# Patient Record
Sex: Male | Born: 1940
Health system: Southern US, Community
[De-identification: ages and names within clinical notes are randomized; demographics above are authoritative.]

## PROBLEM LIST (undated history)

## (undated) DIAGNOSIS — Z8719 Personal history of other diseases of the digestive system: Secondary | ICD-10-CM

## (undated) DIAGNOSIS — I10 Essential (primary) hypertension: Secondary | ICD-10-CM

## (undated) DIAGNOSIS — M1990 Unspecified osteoarthritis, unspecified site: Secondary | ICD-10-CM

## (undated) DIAGNOSIS — T8859XA Other complications of anesthesia, initial encounter: Secondary | ICD-10-CM

## (undated) DIAGNOSIS — T4145XA Adverse effect of unspecified anesthetic, initial encounter: Secondary | ICD-10-CM

## (undated) DIAGNOSIS — C801 Malignant (primary) neoplasm, unspecified: Secondary | ICD-10-CM

## (undated) DIAGNOSIS — Z9889 Other specified postprocedural states: Secondary | ICD-10-CM

## (undated) DIAGNOSIS — K219 Gastro-esophageal reflux disease without esophagitis: Secondary | ICD-10-CM

## (undated) DIAGNOSIS — R7303 Prediabetes: Secondary | ICD-10-CM

## (undated) DIAGNOSIS — R112 Nausea with vomiting, unspecified: Secondary | ICD-10-CM

## (undated) HISTORY — PX: OTHER SURGICAL HISTORY: SHX169

## (undated) HISTORY — PX: EYE SURGERY: SHX253

## (undated) HISTORY — PX: COLONOSCOPY: SHX174

---

## 1997-11-04 ENCOUNTER — Ambulatory Visit (HOSPITAL_BASED_OUTPATIENT_CLINIC_OR_DEPARTMENT_OTHER): Admission: RE | Admit: 1997-11-04 | Discharge: 1997-11-04 | Payer: Self-pay | Admitting: Orthopedic Surgery

## 2004-12-27 ENCOUNTER — Encounter: Admission: RE | Admit: 2004-12-27 | Discharge: 2004-12-27 | Payer: Self-pay | Admitting: Internal Medicine

## 2005-05-21 ENCOUNTER — Emergency Department (HOSPITAL_COMMUNITY): Admission: EM | Admit: 2005-05-21 | Discharge: 2005-05-21 | Payer: Self-pay | Admitting: Emergency Medicine

## 2005-07-29 DIAGNOSIS — D229 Melanocytic nevi, unspecified: Secondary | ICD-10-CM

## 2005-07-29 HISTORY — DX: Melanocytic nevi, unspecified: D22.9

## 2008-03-28 ENCOUNTER — Emergency Department (HOSPITAL_COMMUNITY): Admission: EM | Admit: 2008-03-28 | Discharge: 2008-03-28 | Payer: Self-pay | Admitting: Emergency Medicine

## 2008-09-03 ENCOUNTER — Emergency Department (HOSPITAL_COMMUNITY): Admission: EM | Admit: 2008-09-03 | Discharge: 2008-09-03 | Payer: Self-pay | Admitting: Emergency Medicine

## 2009-03-14 ENCOUNTER — Emergency Department (HOSPITAL_COMMUNITY): Admission: EM | Admit: 2009-03-14 | Discharge: 2009-03-14 | Payer: Self-pay | Admitting: Emergency Medicine

## 2009-06-04 ENCOUNTER — Emergency Department (HOSPITAL_COMMUNITY): Admission: EM | Admit: 2009-06-04 | Discharge: 2009-06-04 | Payer: Self-pay | Admitting: Emergency Medicine

## 2010-08-18 LAB — DIFFERENTIAL
Basophils Absolute: 0.1 10*3/uL (ref 0.0–0.1)
Basophils Relative: 1 % (ref 0–1)
Eosinophils Absolute: 0.2 10*3/uL (ref 0.0–0.7)
Neutro Abs: 6.8 10*3/uL (ref 1.7–7.7)

## 2010-08-18 LAB — CBC
HCT: 41.4 % (ref 39.0–52.0)
MCV: 80.8 fL (ref 78.0–100.0)
Platelets: 252 10*3/uL (ref 150–400)
RBC: 5.13 MIL/uL (ref 4.22–5.81)
WBC: 9.7 10*3/uL (ref 4.0–10.5)

## 2010-08-18 LAB — URINALYSIS, ROUTINE W REFLEX MICROSCOPIC
Glucose, UA: NEGATIVE mg/dL
Nitrite: NEGATIVE
Specific Gravity, Urine: 1.017 (ref 1.005–1.030)

## 2010-08-18 LAB — URINE MICROSCOPIC-ADD ON

## 2010-08-18 LAB — COMPREHENSIVE METABOLIC PANEL
AST: 19 U/L (ref 0–37)
Albumin: 3.8 g/dL (ref 3.5–5.2)
CO2: 27 mEq/L (ref 19–32)
Creatinine, Ser: 0.85 mg/dL (ref 0.4–1.5)
Glucose, Bld: 99 mg/dL (ref 70–99)
Potassium: 3.9 mEq/L (ref 3.5–5.1)
Total Bilirubin: 0.8 mg/dL (ref 0.3–1.2)
Total Protein: 6.5 g/dL (ref 6.0–8.3)

## 2011-02-08 LAB — POCT I-STAT, CHEM 8
BUN: 11
Chloride: 105
Glucose, Bld: 96
Hemoglobin: 14.6
Sodium: 141

## 2011-02-08 LAB — OCCULT BLOOD X 1 CARD TO LAB, STOOL: Fecal Occult Bld: NEGATIVE

## 2011-02-08 LAB — CBC
HCT: 42.6
RDW: 14.1

## 2011-02-08 LAB — URINALYSIS, ROUTINE W REFLEX MICROSCOPIC
Bilirubin Urine: NEGATIVE
Glucose, UA: NEGATIVE
Ketones, ur: NEGATIVE
Leukocytes, UA: NEGATIVE
Protein, ur: NEGATIVE
Specific Gravity, Urine: 1.005

## 2011-02-08 LAB — DIFFERENTIAL
Monocytes Absolute: 0.6
Monocytes Relative: 7
Neutro Abs: 6.3
Neutrophils Relative %: 70

## 2012-08-03 ENCOUNTER — Encounter (INDEPENDENT_AMBULATORY_CARE_PROVIDER_SITE_OTHER): Payer: Self-pay | Admitting: *Deleted

## 2012-08-15 ENCOUNTER — Encounter (INDEPENDENT_AMBULATORY_CARE_PROVIDER_SITE_OTHER): Payer: Self-pay | Admitting: *Deleted

## 2012-08-15 ENCOUNTER — Other Ambulatory Visit (INDEPENDENT_AMBULATORY_CARE_PROVIDER_SITE_OTHER): Payer: Self-pay | Admitting: *Deleted

## 2012-08-15 ENCOUNTER — Telehealth (INDEPENDENT_AMBULATORY_CARE_PROVIDER_SITE_OTHER): Payer: Self-pay | Admitting: *Deleted

## 2012-08-15 DIAGNOSIS — Z1211 Encounter for screening for malignant neoplasm of colon: Secondary | ICD-10-CM

## 2012-08-15 MED ORDER — PEG-KCL-NACL-NASULF-NA ASC-C 100 G PO SOLR: 1.0000 | Freq: Once | ORAL | Status: DC

## 2012-08-15 NOTE — Telephone Encounter (Signed)
Patient needs movi prep 

## 2012-10-03 ENCOUNTER — Ambulatory Visit (HOSPITAL_COMMUNITY): Admission: RE | Admit: 2012-10-03 | Payer: Medicare Other | Source: Ambulatory Visit | Admitting: Internal Medicine

## 2012-10-03 ENCOUNTER — Encounter (HOSPITAL_COMMUNITY): Admission: RE | Payer: Self-pay | Source: Ambulatory Visit

## 2012-10-03 SURGERY — COLONOSCOPY
Anesthesia: Moderate Sedation

## 2013-03-12 ENCOUNTER — Other Ambulatory Visit (HOSPITAL_COMMUNITY): Payer: Self-pay | Admitting: Internal Medicine

## 2013-03-12 DIAGNOSIS — R42 Dizziness and giddiness: Secondary | ICD-10-CM

## 2013-03-12 DIAGNOSIS — G459 Transient cerebral ischemic attack, unspecified: Secondary | ICD-10-CM

## 2013-03-14 ENCOUNTER — Ambulatory Visit (HOSPITAL_COMMUNITY)
Admission: RE | Admit: 2013-03-14 | Discharge: 2013-03-14 | Disposition: A | Discharge status: Home / Self Care | Payer: Medicare Other | Source: Ambulatory Visit | Attending: Internal Medicine | Admitting: Internal Medicine

## 2013-03-14 DIAGNOSIS — G459 Transient cerebral ischemic attack, unspecified: Secondary | ICD-10-CM

## 2013-03-14 DIAGNOSIS — I10 Essential (primary) hypertension: Secondary | ICD-10-CM | POA: Insufficient documentation

## 2013-03-14 DIAGNOSIS — R42 Dizziness and giddiness: Secondary | ICD-10-CM

## 2013-03-14 DIAGNOSIS — I079 Rheumatic tricuspid valve disease, unspecified: Secondary | ICD-10-CM | POA: Insufficient documentation

## 2013-03-14 DIAGNOSIS — I359 Nonrheumatic aortic valve disorder, unspecified: Secondary | ICD-10-CM | POA: Insufficient documentation

## 2013-03-14 DIAGNOSIS — I059 Rheumatic mitral valve disease, unspecified: Secondary | ICD-10-CM | POA: Insufficient documentation

## 2013-03-14 DIAGNOSIS — I517 Cardiomegaly: Secondary | ICD-10-CM | POA: Insufficient documentation

## 2013-03-14 NOTE — Progress Notes (Signed)
*  PRELIMINARY RESULTS* Vascular Ultrasound Carotid Duplex (Doppler) has been completed.  Preliminary findings: Bilateral:  1-39% ICA stenosis.  Vertebral artery flow is antegrade.      Farrel Demark, RDMS, RVT  03/14/2013, 9:48 AM

## 2013-03-14 NOTE — Progress Notes (Signed)
Echocardiogram 2D Echocardiogram has been performed.  Kristan Votta 03/14/2013, 9:50 AM

## 2014-04-09 DIAGNOSIS — C4492 Squamous cell carcinoma of skin, unspecified: Secondary | ICD-10-CM

## 2014-04-09 HISTORY — DX: Squamous cell carcinoma of skin, unspecified: C44.92

## 2014-05-13 DIAGNOSIS — R319 Hematuria, unspecified: Secondary | ICD-10-CM | POA: Diagnosis not present

## 2014-05-13 DIAGNOSIS — I10 Essential (primary) hypertension: Secondary | ICD-10-CM | POA: Diagnosis not present

## 2014-05-13 DIAGNOSIS — E119 Type 2 diabetes mellitus without complications: Secondary | ICD-10-CM | POA: Diagnosis not present

## 2014-07-28 DIAGNOSIS — I10 Essential (primary) hypertension: Secondary | ICD-10-CM | POA: Diagnosis not present

## 2014-07-28 DIAGNOSIS — E1169 Type 2 diabetes mellitus with other specified complication: Secondary | ICD-10-CM | POA: Diagnosis not present

## 2014-08-19 DIAGNOSIS — L719 Rosacea, unspecified: Secondary | ICD-10-CM | POA: Diagnosis not present

## 2014-10-14 DIAGNOSIS — L719 Rosacea, unspecified: Secondary | ICD-10-CM | POA: Diagnosis not present

## 2014-11-25 DIAGNOSIS — I10 Essential (primary) hypertension: Secondary | ICD-10-CM | POA: Diagnosis not present

## 2015-01-13 DIAGNOSIS — Z91018 Allergy to other foods: Secondary | ICD-10-CM | POA: Diagnosis not present

## 2015-01-13 DIAGNOSIS — H1045 Other chronic allergic conjunctivitis: Secondary | ICD-10-CM | POA: Diagnosis not present

## 2015-01-13 DIAGNOSIS — T783XXD Angioneurotic edema, subsequent encounter: Secondary | ICD-10-CM | POA: Diagnosis not present

## 2015-01-13 DIAGNOSIS — J3089 Other allergic rhinitis: Secondary | ICD-10-CM | POA: Diagnosis not present

## 2015-03-21 DIAGNOSIS — Z23 Encounter for immunization: Secondary | ICD-10-CM | POA: Diagnosis not present

## 2015-04-06 DIAGNOSIS — R319 Hematuria, unspecified: Secondary | ICD-10-CM | POA: Diagnosis not present

## 2015-04-06 DIAGNOSIS — I10 Essential (primary) hypertension: Secondary | ICD-10-CM | POA: Diagnosis not present

## 2015-04-27 ENCOUNTER — Inpatient Hospital Stay (HOSPITAL_COMMUNITY)
Admission: EM | Admit: 2015-04-27 | Discharge: 2015-04-30 | DRG: 392 | Disposition: A | Discharge status: Home / Self Care | Payer: Medicare Other | Attending: Internal Medicine | Admitting: Internal Medicine

## 2015-04-27 ENCOUNTER — Emergency Department (HOSPITAL_COMMUNITY): Payer: Medicare Other

## 2015-04-27 ENCOUNTER — Encounter (HOSPITAL_COMMUNITY): Payer: Self-pay | Admitting: Emergency Medicine

## 2015-04-27 DIAGNOSIS — F17220 Nicotine dependence, chewing tobacco, uncomplicated: Secondary | ICD-10-CM | POA: Diagnosis present

## 2015-04-27 DIAGNOSIS — K5792 Diverticulitis of intestine, part unspecified, without perforation or abscess without bleeding: Secondary | ICD-10-CM | POA: Diagnosis present

## 2015-04-27 DIAGNOSIS — R319 Hematuria, unspecified: Secondary | ICD-10-CM | POA: Diagnosis present

## 2015-04-27 DIAGNOSIS — R21 Rash and other nonspecific skin eruption: Secondary | ICD-10-CM | POA: Diagnosis present

## 2015-04-27 DIAGNOSIS — M1990 Unspecified osteoarthritis, unspecified site: Secondary | ICD-10-CM | POA: Diagnosis present

## 2015-04-27 DIAGNOSIS — D72829 Elevated white blood cell count, unspecified: Secondary | ICD-10-CM | POA: Diagnosis present

## 2015-04-27 DIAGNOSIS — R1030 Lower abdominal pain, unspecified: Secondary | ICD-10-CM | POA: Diagnosis present

## 2015-04-27 DIAGNOSIS — K5720 Diverticulitis of large intestine with perforation and abscess without bleeding: Secondary | ICD-10-CM | POA: Diagnosis not present

## 2015-04-27 DIAGNOSIS — Z880 Allergy status to penicillin: Secondary | ICD-10-CM

## 2015-04-27 DIAGNOSIS — R1032 Left lower quadrant pain: Secondary | ICD-10-CM | POA: Diagnosis not present

## 2015-04-27 DIAGNOSIS — I10 Essential (primary) hypertension: Secondary | ICD-10-CM | POA: Diagnosis not present

## 2015-04-27 HISTORY — DX: Essential (primary) hypertension: I10

## 2015-04-27 HISTORY — DX: Other complications of anesthesia, initial encounter: T88.59XA

## 2015-04-27 HISTORY — DX: Adverse effect of unspecified anesthetic, initial encounter: T41.45XA

## 2015-04-27 HISTORY — DX: Unspecified osteoarthritis, unspecified site: M19.90

## 2015-04-27 LAB — COMPREHENSIVE METABOLIC PANEL
ALK PHOS: 64 U/L (ref 38–126)
ALT: 14 U/L — AB (ref 17–63)
ANION GAP: 7 (ref 5–15)
AST: 17 U/L (ref 15–41)
Albumin: 4.1 g/dL (ref 3.5–5.0)
BILIRUBIN TOTAL: 1.2 mg/dL (ref 0.3–1.2)
BUN: 13 mg/dL (ref 6–20)
CALCIUM: 9.5 mg/dL (ref 8.9–10.3)
CO2: 26 mmol/L (ref 22–32)
Chloride: 103 mmol/L (ref 101–111)
Creatinine, Ser: 0.98 mg/dL (ref 0.61–1.24)
GFR calc Af Amer: >60 mL/min (ref >60)
GFR calc non Af Amer: >60 mL/min (ref >60)
GLUCOSE: 119 mg/dL — AB (ref 65–99)
Potassium: 4.3 mmol/L (ref 3.5–5.1)
Sodium: 136 mmol/L (ref 135–145)
Total Protein: 7.4 g/dL (ref 6.5–8.1)

## 2015-04-27 LAB — PROTIME-INR
INR: 1.16 (ref 0.00–1.49)
Prothrombin Time: 15 seconds (ref 11.6–15.2)

## 2015-04-27 LAB — CBC
HCT: 40.2 % (ref 39.0–52.0)
HEMATOCRIT: 45.3 % (ref 39.0–52.0)
Hemoglobin: 15.1 g/dL (ref 13.0–17.0)
Hemoglobin: 13.8 g/dL (ref 13.0–17.0)
MCH: 27.1 pg (ref 26.0–34.0)
MCH: 27.8 pg (ref 26.0–34.0)
MCHC: 33.3 g/dL (ref 30.0–36.0)
MCHC: 34.3 g/dL (ref 30.0–36.0)
MCV: 81.2 fL (ref 78.0–100.0)
MCV: 80.9 fL (ref 78.0–100.0)
PLATELETS: 272 10*3/uL (ref 150–400)
PLATELETS: 221 10*3/uL (ref 150–400)
RBC: 5.58 MIL/uL (ref 4.22–5.81)
RBC: 4.97 MIL/uL (ref 4.22–5.81)
RDW: 14.1 % (ref 11.5–15.5)
RDW: 14.2 % (ref 11.5–15.5)
WBC: 14 10*3/uL — AB (ref 4.0–10.5)
WBC: 10.3 10*3/uL (ref 4.0–10.5)

## 2015-04-27 LAB — URINALYSIS, ROUTINE W REFLEX MICROSCOPIC
BILIRUBIN URINE: NEGATIVE
GLUCOSE, UA: NEGATIVE mg/dL
KETONES UR: NEGATIVE mg/dL
LEUKOCYTES UA: NEGATIVE
Nitrite: NEGATIVE
PH: 6.5 (ref 5.0–8.0)
PROTEIN: NEGATIVE mg/dL
Specific Gravity, Urine: 1.005 (ref 1.005–1.030)

## 2015-04-27 LAB — CREATININE, SERUM
CREATININE: 0.94 mg/dL (ref 0.61–1.24)
GFR calc non Af Amer: >60 mL/min (ref >60)

## 2015-04-27 LAB — URINE MICROSCOPIC-ADD ON
Bacteria, UA: NONE SEEN
Squamous Epithelial / LPF: NONE SEEN

## 2015-04-27 LAB — LIPASE, BLOOD: Lipase: 24 U/L (ref 11–51)

## 2015-04-27 MED ORDER — IOHEXOL 300 MG/ML  SOLN
50.0000 mL | Freq: Once | INTRAMUSCULAR | Status: DC | PRN
  Administered 2015-04-27: 50 mL via ORAL
  Filled 2015-04-27: qty 50

## 2015-04-27 MED ORDER — ONDANSETRON HCL 4 MG PO TABS: 4.0000 mg | ORAL_TABLET | Freq: Four times a day (QID) | ORAL | Status: DC | PRN

## 2015-04-27 MED ORDER — IOHEXOL 300 MG/ML  SOLN
100.0000 mL | Freq: Once | INTRAMUSCULAR | Status: AC | PRN
  Administered 2015-04-27: 100 mL via INTRAVENOUS

## 2015-04-27 MED ORDER — GUAIFENESIN-DM 100-10 MG/5ML PO SYRP: 5.0000 mL | ORAL_SOLUTION | ORAL | Status: DC | PRN

## 2015-04-27 MED ORDER — SODIUM CHLORIDE 0.9 % IV BOLUS (SEPSIS)
500.0000 mL | Freq: Once | INTRAVENOUS | Status: AC
  Administered 2015-04-27: 500 mL via INTRAVENOUS

## 2015-04-27 MED ORDER — MORPHINE SULFATE (PF) 2 MG/ML IV SOLN
2.0000 mg | Freq: Once | INTRAVENOUS | Status: AC
  Administered 2015-04-27: 2 mg via INTRAVENOUS
  Filled 2015-04-27: qty 1

## 2015-04-27 MED ORDER — HYDROCODONE-ACETAMINOPHEN 5-325 MG PO TABS: 1.0000 | ORAL_TABLET | ORAL | Status: DC | PRN

## 2015-04-27 MED ORDER — MORPHINE SULFATE (PF) 2 MG/ML IV SOLN: 2.0000 mg | INTRAVENOUS | Status: DC | PRN

## 2015-04-27 MED ORDER — ONDANSETRON HCL 4 MG/2ML IJ SOLN: 4.0000 mg | Freq: Four times a day (QID) | INTRAMUSCULAR | Status: DC | PRN

## 2015-04-27 MED ORDER — DOCUSATE SODIUM 100 MG PO CAPS
200.0000 mg | ORAL_CAPSULE | Freq: Two times a day (BID) | ORAL | Status: DC
  Administered 2015-04-27 – 2015-04-29 (×5): 200 mg via ORAL
  Filled 2015-04-27 (×7): qty 2

## 2015-04-27 MED ORDER — ENOXAPARIN SODIUM 40 MG/0.4ML ~~LOC~~ SOLN
40.0000 mg | SUBCUTANEOUS | Status: DC
  Administered 2015-04-27 – 2015-04-29 (×3): 40 mg via SUBCUTANEOUS
  Filled 2015-04-27 (×4): qty 0.4

## 2015-04-27 MED ORDER — DILTIAZEM HCL ER COATED BEADS 120 MG PO CP24
120.0000 mg | ORAL_CAPSULE | Freq: Every day | ORAL | Status: DC
  Administered 2015-04-27 – 2015-04-29 (×3): 120 mg via ORAL
  Filled 2015-04-27 (×4): qty 1

## 2015-04-27 MED ORDER — HYDRALAZINE HCL 20 MG/ML IJ SOLN: 10.0000 mg | Freq: Four times a day (QID) | INTRAMUSCULAR | Status: DC | PRN

## 2015-04-27 MED ORDER — SODIUM CHLORIDE 0.9 % IV SOLN
500.0000 mg | Freq: Three times a day (TID) | INTRAVENOUS | Status: DC
  Administered 2015-04-27 – 2015-04-28 (×3): 500 mg via INTRAVENOUS
  Filled 2015-04-27 (×4): qty 500

## 2015-04-27 MED ORDER — SODIUM CHLORIDE 0.9 % IV SOLN
INTRAVENOUS | Status: DC
  Administered 2015-04-27: 1 mL via INTRAVENOUS

## 2015-04-27 NOTE — ED Notes (Signed)
Bed: WA08 Expected date:  Expected time:  Means of arrival:  Comments: Hold for triage 2 

## 2015-04-27 NOTE — ED Notes (Signed)
Report called to [facility] 5th floor.

## 2015-04-27 NOTE — ED Notes (Signed)
Per pt, states he is having abdominal pain r/t his diverticulitis-states he has been taking an antibiotic

## 2015-04-27 NOTE — Progress Notes (Signed)
ANTIBIOTIC CONSULT NOTE - INITIAL  Pharmacy Consult for Primaxin Indication: Intra-abdominal infection  Allergies  Allergen Reactions  . Ciprofloxacin Other (See Comments)    "[place] me in [place]" messed with is mind. Was taking with the flagyl so not sure which medication he is allergic to.   . Flagyl [Metronidazole] Other (See Comments)    "put me in [place]" messed with his mind. Was taking in combination with Cipro. So pt is unsure which medication he is allergic to.   . Penicillins     Has patient had a PCN reaction causing immediate rash, facial/tongue/throat swelling, SOB or lightheadedness with hypotension: No Has patient had a PCN reaction causing severe rash involving mucus membranes or skin necrosis: Yes-rash on chest. Has patient had a PCN reaction that required hospitalization No Has patient had a PCN reaction occurring within the last 10 years: No If all of the above answers are "NO", then may proceed with Cephalosporin use.     Patient Measurements:   Adjusted Body Weight:   Vital Signs: Temp: 97.8 F (36.6 C) (12/19 1541) Temp Source: Temporal (12/19 1541) BP: 142/86 mmHg (12/19 1541) Pulse Rate: 75 (12/19 1541) Intake/Output from previous day:   Intake/Output from this shift:    Labs:  Recent Labs  04/27/15 1309  WBC 14.0*  HGB 15.1  PLT 272  CREATININE 0.98   CrCl cannot be calculated (Unknown ideal weight.). No results for input(s): VANCOTROUGH, VANCOPEAK, VANCORANDOM, GENTTROUGH, GENTPEAK, GENTRANDOM, TOBRATROUGH, TOBRAPEAK, TOBRARND, AMIKACINPEAK, AMIKACINTROU, AMIKACIN in the last 72 hours.   Microbiology: No results found for this or any previous visit (from the past 720 hour(s)).  Medical History: Past Medical History  Diagnosis Date  . Diverticulitis   . Essential hypertension     Medications:  Anti-infectives    Start     Dose/Rate Route Frequency Ordered Stop   04/27/15 1600  imipenem-cilastatin (PRIMAXIN) 500 mg in sodium  chloride 0.9 % 100 mL IVPB     500 mg 200 mL/hr over 30 Minutes Intravenous 3 times per day 04/27/15 1524       Assessment: 74yo M with history of diverticulitis and two day worsening abdominal pain despite outpatient doxycycline. Pharmacy is asked to dose Primaxin. SCr is wnl, CrCl ~67N.  Goal of Therapy:  Appropriate antibiotic dosing for renal function; eradication of infection  Plan:  Primaxin 500mg  IV q8h. Follow up renal fxn, culture results, and clinical course.  Romeo Rabon, PharmD, pager [phone]. 04/27/2015,6:01 PM.

## 2015-04-27 NOTE — H&P (Signed)
Patient Demographics:    John Austin, is a 74 y.o. male  MRN: [id number]   DOB - January 13, 1941  Admit Date - 04/27/2015  Outpatient Primary MD for the patient is Cleda Mccreedy, MD   With History of -  Past Medical History  Diagnosis Date  . Diverticulitis   . Essential hypertension       History reviewed. No pertinent past surgical history.  in for   Chief Complaint  Patient presents with  . Abdominal Pain      HPI:    John Austin  is a 74 y.o. 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 Came to the ER where workup was suggestive of descending diverticulitis, I was called to admit the patient.  Besides above dictated review of systems all other review of systems are negative.    Review of systems:    In addition to the HPI above,   Positive subjective Fever-chills, No Headache, No changes with Vision or hearing, No problems swallowing food or Liquids, No Chest pain, Cough or Shortness of Breath, As above Abdominal pain, No Nausea or Vommitting, Bowel movements are regular, No Blood in stool or Urine, No dysuria, No new skin rashes or bruises, No new joints pains-aches,  No new weakness, tingling, numbness in any extremity, No recent weight gain or loss, No polyuria, polydypsia or polyphagia, No significant Mental Stressors.  A full 10 point Review of Systems was  done, except as stated above, all other Review of Systems were negative.    Social History:     Social History  Substance Use Topics  . Smoking status: Never Smoker   . Smokeless tobacco: Current User    Types: Chew  . Alcohol Use: No    Lives - at home and is fairly mobile and active      Family History :   History of colon polyps and his son   Home Medications:   Prior to Admission medications   Medication Sig Start Date End Date Taking? Authorizing Provider  celecoxib (CELEBREX) 200 MG capsule Take 1 capsule by mouth daily as needed. Foot pain. 03/31/15  Yes Historical Provider, MD  diltiazem (CARDIZEM CD) 120 MG 24 hr capsule Take 1 capsule by mouth at bedtime. 04/25/15  Yes Historical Provider, MD  doxycycline (VIBRAMYCIN) 50 MG capsule Take 1 capsule by mouth at bedtime. 03/02/15  Yes Historical Provider, MD  fexofenadine (ALLEGRA) 180 MG tablet Take 180 mg by mouth daily.   Yes Historical Provider, MD  fluticasone (FLONASE) 50 MCG/ACT nasal spray Place 1  spray into both nostrils daily as needed. Allergies. 03/30/15  Yes Historical Provider, MD  meclizine (ANTIVERT) 25 MG tablet Take 1 tablet by mouth daily as needed. dizziness 02/02/15  Yes Historical Provider, MD  peg 3350 powder (MOVIPREP) 100 G SOLR Take 1 kit (100 g total) by mouth once. Patient not taking: Reported on 04/27/2015 08/15/12   Butch Penny, NP     Allergies:     Allergies  Allergen Reactions  . Ciprofloxacin Other (See Comments)    "put me in [place]" messed with is mind. Was taking with the flagyl so not sure which medication he is allergic to.   . Flagyl [Metronidazole] Other (See Comments)    "put me in [place]" messed with his mind. Was taking in combination with Cipro. So pt is unsure which medication he is allergic to.   . Penicillins     Has patient had a PCN reaction causing immediate rash, facial/tongue/throat swelling, SOB or lightheadedness with hypotension: No Has patient had a  PCN reaction causing severe rash involving mucus membranes or skin necrosis: Yes-rash on chest. Has patient had a PCN reaction that required hospitalization No Has patient had a PCN reaction occurring within the last 10 years: No If all of the above answers are "NO", then may proceed with Cephalosporin use.      Physical Exam:   Vitals  Blood pressure 142/86, pulse 75, temperature 97.8 F (36.6 C), temperature source Temporal, resp. rate 18, SpO2 98 %.   1. General pleasant elderly white male lying in bed in NAD,     2. Normal affect and insight, Not Suicidal or Homicidal, Awake Alert, Oriented X 3.  3. No F.N deficits, ALL C.Nerves Intact, Strength 5/5 all 4 extremities, Sensation intact all 4 extremities, Plantars down going.  4. Ears and Eyes appear Normal, Conjunctivae clear, PERRLA. Moist Oral Mucosa.  5. Supple Neck, No JVD, No cervical lymphadenopathy appriciated, No Carotid Bruits.  6. Symmetrical Chest wall movement, Good air movement bilaterally, CTAB.  7. RRR, No Gallops, Rubs or Murmurs, No Parasternal Heave.  8. Positive Bowel Sounds, Abdomen Soft, mild LLQ tenderness, No organomegaly appriciated,No rebound -guarding or rigidity.  9.  No Cyanosis, Normal Skin Turgor, No Skin Rash or Bruise.  10. Good muscle tone,  joints appear normal , no effusions, Normal ROM.  11. No Palpable Lymph Nodes in Neck or Axillae      Data Review:    CBC  Recent Labs Lab 04/27/15 1309  WBC 14.0*  HGB 15.1  HCT 45.3  PLT 272  MCV 81.2  MCH 27.1  MCHC 33.3  RDW 14.1   ------------------------------------------------------------------------------------------------------------------  Chemistries   Recent Labs Lab 04/27/15 1309  NA 136  K 4.3  CL 103  CO2 26  GLUCOSE 119*  BUN 13  CREATININE 0.98  CALCIUM 9.5  AST 17  ALT 14*  ALKPHOS 64  BILITOT 1.2    ------------------------------------------------------------------------------------------------------------------ CrCl cannot be calculated (Unknown ideal weight.). ------------------------------------------------------------------------------------------------------------------ No results for input(s): TSH, T4TOTAL, T3FREE, THYROIDAB in the last 72 hours.  Invalid input(s): FREET3   Coagulation profile No results for input(s): INR, PROTIME in the last 168 hours. ------------------------------------------------------------------------------------------------------------------- No results for input(s): DDIMER in the last 72 hours. -------------------------------------------------------------------------------------------------------------------  Cardiac Enzymes No results for input(s): CKMB, TROPONINI, MYOGLOBIN in the last 168 hours.  Invalid input(s): CK ------------------------------------------------------------------------------------------------------------------ Invalid input(s): POCBNP   ---------------------------------------------------------------------------------------------------------------  Urinalysis    Component Value Date/Time   COLORURINE YELLOW 04/27/2015 [place]  04/27/2015 1438   LABSPEC 1.005 04/27/2015 1438   PHURINE 6.5 04/27/2015 1438   GLUCOSEU NEGATIVE 04/27/2015 1438   HGBUR MODERATE* 04/27/2015 [place] 04/27/2015 [place] 04/27/2015 1438   PROTEINUR NEGATIVE 04/27/2015 1438   UROBILINOGEN 0.2 09/03/2008 1606   NITRITE NEGATIVE 04/27/2015 1438   LEUKOCYTESUR NEGATIVE 04/27/2015 1438    ----------------------------------------------------------------------------------------------------------------   Imaging Results:    Ct Abdomen Pelvis W Contrast  04/27/2015  CLINICAL DATA:  Left lower quadrant pain for 1 day. History of diverticulitis. EXAM: CT ABDOMEN AND PELVIS WITH CONTRAST  TECHNIQUE: Multidetector CT imaging of the abdomen and pelvis was performed using the standard protocol following bolus administration of intravenous contrast. CONTRAST:  150m OMNIPAQUE IOHEXOL 300 MG/ML  SOLN COMPARISON:  09/03/2008 FINDINGS: 3 mm right middle lobe nodule is unchanged and considered benign. The lung bases are free of consolidation or effusion. Small layering stones are again seen in the gallbladder. There is no biliary dilatation. The liver, spleen, adrenal glands, and pancreas have an unremarkable enhanced appearance. A 3.8 cm left lower pole renal cyst is larger than on the prior study (previously 2.9 cm). Additional subcentimeter hypodensities are noted both kidneys, likely cysts but too small to fully characterize. There is a moderate-size sliding hiatal hernia. Oral contrast is present in nondilated loops of small and large bowel to the level of the ascending colon without evidence of obstruction. Diverticulosis is again seen of the descending and sigmoid colon. There is moderate wall thickening and surrounding inflammatory changes involving the distal descending colon. There is no evidence of frank bowel perforation or abscess. The appendix is unremarkable. The bladder is unremarkable. A retroaortic left renal vein is incidentally noted. Moderate atherosclerotic vascular calcification is present. No free fluid or enlarged lymph nodes are identified. A small amount of fat enters both inguinal canals. Moderate lumbar disc degeneration is noted. IMPRESSION: 1. Acute diverticulitis of the distal descending colon.  No abscess. 2. Cholelithiasis. 3. Hiatal hernia. Electronically Signed   By: ALogan BoresM.D.   On: 04/27/2015 15:34        Assessment & Plan:     1.  Acute diverticulitis - admit to MedSurg, bowel rest, IV fluids, due to multiple drug allergies place on IV Primaxin to be dosed by pharmacy. Place on Colace avoid constipation. Continue supportive care.   2. Essential  hypertension. As needed IV hydralazine and monitor, hold Cardizem.  3. Tobacco chewing. Counseled to quit.  4. Chronic stable hematuria. No acute issues outpatient follow-up with PCP.   5. Osteoarthritis diffuse. Supportive care.   DVT Prophylaxis   Lovenox   AM Labs Ordered, also please review Full Orders  Family Communication: Admission, patients condition and plan of care including tests being ordered have been discussed with the patient and family who indicate understanding and agree with the plan and Code Status.  Code Status Full  Likely DC to Home 2-3 days  Condition Fair  Time spent in minutes : 35    SINGH,PRASHANT K M.D on 04/27/2015 at 4:13 PM  Between 7am to 7pm - Pager - [phone] After 7pm go to [email or web address] - password [email or web address] - Office  [phone]

## 2015-04-27 NOTE — ED Provider Notes (Addendum)
CSN: [id number]     Arrival date & time 04/27/15  1248 History   First MD Initiated Contact with Patient 04/27/15 1315     Chief Complaint  Patient presents with  . Abdominal Pain     (Consider location/radiation/quality/duration/timing/severity/associated sxs/prior Treatment) HPI  74 year old man history of diverticulitis presents today complaining of lower abdominal pain is consistent with his diverticulitis. He states that it started 2 days ago. He began taking doxycycline which his primary care physician has given him to take for this. The pain has continued to increase. He is able to take in liquids but does not have any appetite. He denies any fever or chills. He is nauseated but has not been vomiting. He has not had diarrhea. Pain is moderate to severe.  Past Medical History  Diagnosis Date  . Diverticulitis   . Hypertension    History reviewed. No pertinent past surgical history. No family history on file. Social History  Substance Use Topics  . Smoking status: Never Smoker   . Smokeless tobacco: None  . Alcohol Use: No    Review of Systems  All other systems reviewed and are negative.     Allergies  Ciprofloxacin; Flagyl; and Penicillins  Home Medications   Prior to Admission medications   Medication Sig Start Date End Date Taking? Authorizing Provider  celecoxib (CELEBREX) 200 MG capsule Take 1 capsule by mouth daily as needed. Foot pain. 03/31/15  Yes Historical Provider, MD  diltiazem (CARDIZEM CD) 120 MG 24 hr capsule Take 1 capsule by mouth at bedtime. 04/25/15  Yes Historical Provider, MD  doxycycline (VIBRAMYCIN) 50 MG capsule Take 1 capsule by mouth at bedtime. 03/02/15  Yes Historical Provider, MD  fexofenadine (ALLEGRA) 180 MG tablet Take 180 mg by mouth daily.   Yes Historical Provider, MD  fluticasone (FLONASE) 50 MCG/ACT nasal spray Place 1 spray into both nostrils daily as needed. Allergies. 03/30/15  Yes Historical Provider, MD  meclizine  (ANTIVERT) 25 MG tablet Take 1 tablet by mouth daily as needed. dizziness 02/02/15  Yes Historical Provider, MD  peg 3350 powder (MOVIPREP) 100 G SOLR Take 1 kit (100 g total) by mouth once. Patient not taking: Reported on 04/27/2015 08/15/12   Butch Penny, NP   BP 128/79 mmHg  Pulse 88  Temp(Src) 97.8 F (36.6 C) (Oral)  Resp 18  SpO2 100% Physical Exam  Constitutional: He appears well-developed and well-nourished. No distress.  HENT:  Head: Normocephalic and atraumatic.  Right Ear: External ear normal.  Left Ear: External ear normal.  Nose: Nose normal.  Mouth/Throat: Oropharynx is clear and moist.  Eyes: Conjunctivae and EOM are normal. Pupils are equal, round, and reactive to light.  Neck: Normal range of motion. Neck supple.  Cardiovascular: Normal rate and regular rhythm.   Pulmonary/Chest: Effort normal and breath sounds normal.  Abdominal: Soft. There is tenderness.    Musculoskeletal: Normal range of motion.  Neurological: He is alert.  Skin: Skin is warm and dry.  Psychiatric: He has a normal mood and affect. His behavior is normal. Thought content normal.  Nursing note and vitals reviewed.   ED Course  Procedures (including critical care time) Labs Review Labs Reviewed  COMPREHENSIVE METABOLIC PANEL - Abnormal; Notable for the following:    Glucose, Bld 119 (*)    ALT 14 (*)    All other components within normal limits  CBC - Abnormal; Notable for the following:    WBC 14.0 (*)    All other components within  normal limits  LIPASE, BLOOD  URINALYSIS, ROUTINE W REFLEX MICROSCOPIC (NOT AT [facility])    Imaging Review Ct Abdomen Pelvis W Contrast  04/27/2015  CLINICAL DATA:  Left lower quadrant pain for 1 day. History of diverticulitis. EXAM: CT ABDOMEN AND PELVIS WITH CONTRAST TECHNIQUE: Multidetector CT imaging of the abdomen and pelvis was performed using the standard protocol following bolus administration of intravenous contrast. CONTRAST:  134m OMNIPAQUE  IOHEXOL 300 MG/ML  SOLN COMPARISON:  09/03/2008 FINDINGS: 3 mm right middle lobe nodule is unchanged and considered benign. The lung bases are free of consolidation or effusion. Small layering stones are again seen in the gallbladder. There is no biliary dilatation. The liver, spleen, adrenal glands, and pancreas have an unremarkable enhanced appearance. A 3.8 cm left lower pole renal cyst is larger than on the prior study (previously 2.9 cm). Additional subcentimeter hypodensities are noted both kidneys, likely cysts but too small to fully characterize. There is a moderate-size sliding hiatal hernia. Oral contrast is present in nondilated loops of small and large bowel to the level of the ascending colon without evidence of obstruction. Diverticulosis is again seen of the descending and sigmoid colon. There is moderate wall thickening and surrounding inflammatory changes involving the distal descending colon. There is no evidence of frank bowel perforation or abscess. The appendix is unremarkable. The bladder is unremarkable. A retroaortic left renal vein is incidentally noted. Moderate atherosclerotic vascular calcification is present. No free fluid or enlarged lymph nodes are identified. A small amount of fat enters both inguinal canals. Moderate lumbar disc degeneration is noted. IMPRESSION: 1. Acute diverticulitis of the distal descending colon.  No abscess. 2. Cholelithiasis. 3. Hiatal hernia. Electronically Signed   By: ALogan BoresM.D.   On: 04/27/2015 15:34   I have personally reviewed and evaluated these images and lab results as part of my medical decision-making.   EKG Interpretation None      MDM   Final diagnoses:  Diverticulitis of large intestine with perforation and abscess without bleeding    74year old and history of diverticulitis presents today with 2 days of worsening diverticulitis symptoms. Workup is significant for leukocytosis and acute diverticulitis of the distal  descending colon seen on CT. No evidence of abscess is noted. Patient is given IV antibiotics. He had been taking outpatient oral antibiotics prior to coming in. Plan admission due to failed outpatient therapy of diverticulitis.    DPattricia Boss MD 04/29/15 1415  DPattricia Boss MD 04/29/15 [phone]

## 2015-04-28 LAB — BASIC METABOLIC PANEL
Anion gap: 8 (ref 5–15)
BUN: 12 mg/dL (ref 6–20)
CALCIUM: 8.8 mg/dL — AB (ref 8.9–10.3)
CHLORIDE: 107 mmol/L (ref 101–111)
CO2: 25 mmol/L (ref 22–32)
CREATININE: 0.9 mg/dL (ref 0.61–1.24)
GFR calc Af Amer: >60 mL/min (ref >60)
GFR calc non Af Amer: >60 mL/min (ref >60)
GLUCOSE: 76 mg/dL (ref 65–99)
Potassium: 4.3 mmol/L (ref 3.5–5.1)
Sodium: 140 mmol/L (ref 135–145)

## 2015-04-28 LAB — CBC
HCT: 38.3 % — ABNORMAL LOW (ref 39.0–52.0)
Hemoglobin: 12.9 g/dL — ABNORMAL LOW (ref 13.0–17.0)
MCH: 27.5 pg (ref 26.0–34.0)
MCHC: 33.7 g/dL (ref 30.0–36.0)
MCV: 81.7 fL (ref 78.0–100.0)
PLATELETS: 208 10*3/uL (ref 150–400)
RBC: 4.69 MIL/uL (ref 4.22–5.81)
RDW: 14.4 % (ref 11.5–15.5)
WBC: 8.1 10*3/uL (ref 4.0–10.5)

## 2015-04-28 MED ORDER — SODIUM CHLORIDE 0.9 % IV SOLN: INTRAVENOUS | Status: AC

## 2015-04-28 MED ORDER — SODIUM CHLORIDE 0.9 % IV SOLN
500.0000 mg | Freq: Four times a day (QID) | INTRAVENOUS | Status: DC
  Administered 2015-04-28 – 2015-04-30 (×8): 500 mg via INTRAVENOUS
  Filled 2015-04-28 (×9): qty 500

## 2015-04-28 NOTE — Progress Notes (Signed)
Patient Demographics:    John Austin, is a 74 y.o. male, DOB - [birth year], BP:[id number]  Admit date - 04/27/2015   Admitting Physician Thurnell Lose, MD  Outpatient Primary MD for the patient is Cleda Mccreedy, MD  LOS - 1   Chief Complaint  Patient presents with  . Abdominal Pain        Subjective:    John Austin today has, No headache, No chest pain, improved left lower quadrant abdominal pain - No Nausea, No new weakness tingling or numbness, No Cough - SOB.     Assessment  & Plan :    1. Acute diverticulitis - says he is feels better about 30-40%, we'll advance to clear liquids, continue gentle IV fluids, due to multiple drug allergies place on IV Primaxin to be dosed by pharmacy. Place on Colace avoid constipation. Continue supportive care. If better discharge in 1-2 days.  2. Essential hypertension. As needed IV hydralazine and monitor, hold Cardizem.  3. Tobacco chewing. Counseled to quit.  4. Chronic stable hematuria. No acute issues outpatient follow-up with PCP.   5. Osteoarthritis diffuse. Supportive care.    Code Status : Full  Family Communication  : wife bedside  Disposition Plan  : Home 1-2 days  Consults  :  None  Procedures  : CT scan abdomen and pelvis confirming descending colon diverticulitis  DVT Prophylaxis  :  Lovenox    Lab Results  Component Value Date   PLT 208 04/28/2015    Inpatient Medications  Scheduled Meds: . diltiazem  120 mg Oral QHS  . docusate sodium  200 mg Oral BID  . enoxaparin (LOVENOX) injection  40 mg Subcutaneous Q24H  . imipenem-cilastatin  500 mg Intravenous Q6H   Continuous Infusions: . sodium chloride 1 mL (04/27/15 1609)   PRN Meds:.guaiFENesin-dextromethorphan, hydrALAZINE, HYDROcodone-acetaminophen, iohexol,  morphine injection, ondansetron **OR** ondansetron (ZOFRAN) IV  Antibiotics  :     Anti-infectives    Start     Dose/Rate Route Frequency Ordered Stop   04/28/15 1230  imipenem-cilastatin (PRIMAXIN) 500 mg in sodium chloride 0.9 % 100 mL IVPB     500 mg 200 mL/hr over 30 Minutes Intravenous Every 6 hours 04/28/15 0821     04/27/15 1600  imipenem-cilastatin (PRIMAXIN) 500 mg in sodium chloride 0.9 % 100 mL IVPB  Status:  Discontinued     500 mg 200 mL/hr over 30 Minutes Intravenous 3 times per day 04/27/15 1524 04/28/15 0821        Objective:   Filed Vitals:   04/27/15 1541 04/27/15 1707 04/27/15 2220 04/28/15 0455  BP: 142/86 140/75 104/67 124/63  Pulse: 75 66 78 61  Temp: 97.8 F (36.6 C) 98 F (36.7 C) 97.6 F (36.4 C) 97.6 F (36.4 C)  TempSrc: Temporal Oral Oral Oral  Resp: 18 18 16 18   Height:  5\' 9"  (1.753 m)    Weight:  86.1 kg (189 lb 13.1 oz)    SpO2: 98% 98% 96% 96%    Wt Readings from Last 3 Encounters:  04/27/15 86.1 kg (189 lb 13.1 oz)     Intake/Output Summary (Last 24 hours) at 04/28/15 0910 Last data filed at 04/28/15 [id number]  Gross per 24 hour  Intake   1385  ml  Output      0 ml  Net   1385 ml     Physical Exam  Awake Alert, Oriented X 3, No new F.N deficits, Normal affect John Austin.AT,PERRAL Supple Neck,No JVD, No cervical lymphadenopathy appriciated.  Symmetrical Chest wall movement, Good air movement bilaterally, CTAB RRR,No Gallops,Rubs or new Murmurs, No Parasternal Heave +ve B.Sounds, Abd Soft, mild lower quadrant tenderness, left more than right, No organomegaly appriciated, No rebound - guarding or rigidity. No Cyanosis, Clubbing or edema, No new Rash or bruise       Data Review:   Micro Results No results found for this or any previous visit (from the past 240 hour(s)).  Radiology Reports Ct Abdomen Pelvis W Contrast  04/27/2015  CLINICAL DATA:  Left lower quadrant pain for 1 day. History of diverticulitis. EXAM: CT ABDOMEN AND PELVIS  WITH CONTRAST TECHNIQUE: Multidetector CT imaging of the abdomen and pelvis was performed using the standard protocol following bolus administration of intravenous contrast. CONTRAST:  136mL OMNIPAQUE IOHEXOL 300 MG/ML  SOLN COMPARISON:  09/03/2008 FINDINGS: 3 mm right middle lobe nodule is unchanged and considered benign. The lung bases are free of consolidation or effusion. Small layering stones are again seen in the gallbladder. There is no biliary dilatation. The liver, spleen, adrenal glands, and pancreas have an unremarkable enhanced appearance. A 3.8 cm left lower pole renal cyst is larger than on the prior study (previously 2.9 cm). Additional subcentimeter hypodensities are noted both kidneys, likely cysts but too small to fully characterize. There is a moderate-size sliding hiatal hernia. Oral contrast is present in nondilated loops of small and large bowel to the level of the ascending colon without evidence of obstruction. Diverticulosis is again seen of the descending and sigmoid colon. There is moderate wall thickening and surrounding inflammatory changes involving the distal descending colon. There is no evidence of frank bowel perforation or abscess. The appendix is unremarkable. The bladder is unremarkable. A retroaortic left renal vein is incidentally noted. Moderate atherosclerotic vascular calcification is present. No free fluid or enlarged lymph nodes are identified. A small amount of fat enters both inguinal canals. Moderate lumbar disc degeneration is noted. IMPRESSION: 1. Acute diverticulitis of the distal descending colon.  No abscess. 2. Cholelithiasis. 3. Hiatal hernia. Electronically Signed   By: Logan Bores M.D.   On: 04/27/2015 15:34     CBC  Recent Labs Lab 04/27/15 1309 04/27/15 1745 04/28/15 0450  WBC 14.0* 10.3 8.1  HGB 15.1 13.8 12.9*  HCT 45.3 40.2 38.3*  PLT 272 221 208  MCV 81.2 80.9 81.7  MCH 27.1 27.8 27.5  MCHC 33.3 34.3 33.7  RDW 14.1 14.2 14.4     Chemistries   Recent Labs Lab 04/27/15 1309 04/27/15 1745 04/28/15 0450  NA 136  --  140  K 4.3  --  4.3  CL 103  --  107  CO2 26  --  25  GLUCOSE 119*  --  76  BUN 13  --  12  CREATININE 0.98 0.94 0.90  CALCIUM 9.5  --  8.8*  AST 17  --   --   ALT 14*  --   --   ALKPHOS 64  --   --   BILITOT 1.2  --   --    ------------------------------------------------------------------------------------------------------------------ estimated creatinine clearance is 78.3 mL/min (by C-G formula based on Cr of 0.9). ------------------------------------------------------------------------------------------------------------------ No results for input(s): HGBA1C in the last 72 hours. ------------------------------------------------------------------------------------------------------------------ No results for input(s): CHOL, HDL, LDLCALC,  TRIG, CHOLHDL, LDLDIRECT in the last 72 hours. ------------------------------------------------------------------------------------------------------------------ No results for input(s): TSH, T4TOTAL, T3FREE, THYROIDAB in the last 72 hours.  Invalid input(s): FREET3 ------------------------------------------------------------------------------------------------------------------ No results for input(s): VITAMINB12, FOLATE, FERRITIN, TIBC, IRON, RETICCTPCT in the last 72 hours.  Coagulation profile  Recent Labs Lab 04/27/15 1745  INR 1.16    No results for input(s): DDIMER in the last 72 hours.  Cardiac Enzymes No results for input(s): CKMB, TROPONINI, MYOGLOBIN in the last 168 hours.  Invalid input(s): CK ------------------------------------------------------------------------------------------------------------------ Invalid input(s): POCBNP   Time Spent in minutes  35   SINGH,PRASHANT K M.D on 04/28/2015 at 9:10 AM  Between 7am to 7pm - Pager - [phone]  After 7pm go to [email or web address] - password [facility]  [facility] -   Office  [phone]

## 2015-04-28 NOTE — Progress Notes (Signed)
Pharmacy: Re- primaxin  Patient's a 74 y.o M currently on primaxin day #1 for acute diverticulitis. Scr remains stable at 0.90 (crcl~78). No cultures.  Plan: - change primaxin to 500 mg IV q6h for renal function and weight  Dia Sitter, PharmD, BCPS 04/28/2015 8:19 AM

## 2015-04-29 ENCOUNTER — Encounter (HOSPITAL_COMMUNITY): Payer: Self-pay | Admitting: Internal Medicine

## 2015-04-29 DIAGNOSIS — I10 Essential (primary) hypertension: Secondary | ICD-10-CM

## 2015-04-29 DIAGNOSIS — R1032 Left lower quadrant pain: Secondary | ICD-10-CM | POA: Diagnosis present

## 2015-04-29 DIAGNOSIS — D72829 Elevated white blood cell count, unspecified: Secondary | ICD-10-CM | POA: Diagnosis present

## 2015-04-29 NOTE — Care Management Important Message (Signed)
Important Message  Patient Details  [name] MRN: [id number] Date of Birth: 09-Sep-1940   Medicare Important Message Given:  Yes    Camillo Flaming 04/29/2015, 11:26 AMImportant Message  Patient Details  [name] MRN: [id number] Date of Birth: [birth year]   Medicare Important Message Given:  Yes    Camillo Flaming 04/29/2015, 11:25 AM

## 2015-04-29 NOTE — Progress Notes (Addendum)
Patient ID: John Austin, male   DOB: 07-28-1940, 74 y.o.   MRN: [id number] [facility] PROGRESS NOTE  John Austin [id number] DOB: Dec 05, 1940 DOA: 04/27/2015 PCP: Cleda Mccreedy, MD  Brief narrative:    74 y.o. very pleasant gentleman with past medical history of relatively well managed diverticulosis, hypertension who presented to [facility] with reports of lower abdominal pain associated with low grade fevers. He was found to have diverticulitis and was started on Primaxin.  Assessment/Plan:    Principal Problem:   Acute diverticulitis / Leukocytosis - Doing well so far, minor pain in left lower abdomen this am, no nausea or vomiting - Will advance the diet to soft foods - Continue pain management efforts  Active Problems:   Essential hypertension - BP 104/55, controlled with Cardizem   DVT Prophylaxis  - Lovenox subQ ordered   Code Status: Full.  Family Communication:  plan of care discussed with the patient and his wife at the bedside  Disposition Plan: Home possibly by 12/22 or 12/23  IV access:  Peripheral IV  Procedures and diagnostic studies:    Ct Abdomen Pelvis W Contrast 04/27/2015 1. Acute diverticulitis of the distal descending colon.  No abscess. 2. Cholelithiasis. 3. Hiatal hernia. Electronically Signed   By: Logan Bores M.D.   On: 04/27/2015 15:34   Medical Consultants:  None   Other Consultants:  None   IAnti-Infectives:   Primaxin 04/27/2015 -->   Leisa Lenz, MD  [facility] Pager [phone]  Time spent in minutes: 15 minutes  If 7PM-7AM, please contact night-coverage [email or web address] Password [facility] 04/29/2015, 11:25 AM   LOS: 2 days    HPI/Subjective: No acute overnight events. Patient reports some pain on left side of abdomen but no nausea or vomiting.   Objective: Filed Vitals:   04/28/15 0455 04/28/15 1450 04/28/15 2257 04/29/15 0530  BP: 124/63 127/74  104/55  Pulse: 61 71  95  Temp: 97.6 F (36.4 C) 98.3  F (36.8 C)  98.2 F (36.8 C)  TempSrc: Oral Oral Oral Oral  Resp: 18 20  20   Height:      Weight:      SpO2: 96% 97%  95%    Intake/Output Summary (Last 24 hours) at 04/29/15 1125 Last data filed at 04/29/15 0946  Gross per 24 hour  Intake    360 ml  Output      0 ml  Net    360 ml    Exam:   General:  Pt is alert, follows commands appropriately, not in acute distress  Cardiovascular: Regular rate and rhythm, S1/S2, no murmurs  Respiratory: Clear to auscultation bilaterally, no wheezing, no crackles, no rhonchi  Abdomen: mild tenderness on left side without rebound or guarding, (+) BS  Extremities: No edema, pulses DP and PT palpable bilaterally  Neuro: Grossly nonfocal  Data Reviewed: Basic Metabolic Panel:  Recent Labs Lab 04/27/15 1309 04/27/15 1745 04/28/15 0450  NA 136  --  140  K 4.3  --  4.3  CL 103  --  107  CO2 26  --  25  GLUCOSE 119*  --  76  BUN 13  --  12  CREATININE 0.98 0.94 0.90  CALCIUM 9.5  --  8.8*   Liver Function Tests:  Recent Labs Lab 04/27/15 1309  AST 17  ALT 14*  ALKPHOS 64  BILITOT 1.2  PROT 7.4  ALBUMIN 4.1    Recent Labs Lab 04/27/15 1309  LIPASE 24   No results  for input(s): AMMONIA in the last 168 hours. CBC:  Recent Labs Lab 04/27/15 1309 04/27/15 1745 04/28/15 0450  WBC 14.0* 10.3 8.1  HGB 15.1 13.8 12.9*  HCT 45.3 40.2 38.3*  MCV 81.2 80.9 81.7  PLT 272 221 208   Cardiac Enzymes: No results for input(s): CKTOTAL, CKMB, CKMBINDEX, TROPONINI in the last 168 hours. BNP: Invalid input(s): POCBNP CBG: No results for input(s): GLUCAP in the last 168 hours.  No results found for this or any previous visit (from the past 240 hour(s)).   Scheduled Meds: . diltiazem  120 mg Oral QHS  . docusate sodium  200 mg Oral BID  . enoxaparin (LOVENOX) injection  40 mg Subcutaneous Q24H  . imipenem-cilastatin  500 mg Intravenous Q6H   Continuous Infusions:

## 2015-04-30 MED ORDER — AMOXICILLIN-POT CLAVULANATE 875-125 MG PO TABS: 1.0000 | ORAL_TABLET | Freq: Two times a day (BID) | ORAL | Status: DC

## 2015-04-30 NOTE — Discharge Instructions (Addendum)
Diverticulitis Diverticulitis is when small pockets that have formed in your colon (large intestine) become infected or swollen. HOME CARE  Follow your doctor's instructions.  Follow a special diet if told by your doctor.  When you feel better, your doctor may tell you to change your diet. You may be told to eat a lot of fiber. Fruits and vegetables are good sources of fiber. Fiber makes it easier to poop (have bowel movements).  Take supplements or probiotics as told by your doctor.  Only take medicines as told by your doctor.  Keep all follow-up visits with your doctor. GET HELP IF:  Your pain does not get better.  You have a hard time eating food.  You are not pooping like normal. GET HELP RIGHT AWAY IF:  Your pain gets worse.  Your problems do not get better.  Your problems suddenly get worse.  You have a fever.  You keep throwing up (vomiting).  You have bloody or black, tarry poop (stool). MAKE SURE YOU:   Understand these instructions.  Will watch your condition.  Will get help right away if you are not doing well or get worse.   This information is not intended to replace advice given to you by your health care provider. Make sure you discuss any questions you have with your health care provider.   Document Released: 10/12/2007 Document Revised: 04/30/2013 Document Reviewed: 03/20/2013 Elsevier Interactive Patient Education 2016 Elsevier Inc. Amoxicillin; Clavulanic Acid chewable tablets What is this medicine? AMOXICILLIN; CLAVULANIC ACID (a mox i SIL in; KLAV yoo lan ic AS id) is a penicillin antibiotic. It is used to treat certain kinds of bacterial infections. It It will not work for colds, flu, or other viral infections. This medicine may be used for other purposes; ask your health care provider or pharmacist if you have questions. What should I tell my health care provider before I take this medicine? They need to know if you have any of these  conditions: -bowel disease, like colitis -kidney disease -liver disease -mononucleosis -phenylketonuria -an unusual or allergic reaction to amoxicillin, penicillin, cephalosporin, other antibiotics, clavulanic acid, other medicines, foods, dyes, or preservatives -pregnant or trying to get pregnant -breast-feeding How should I use this medicine? Take this medicine by mouth. Chew it completely before swallowing. Follow the directions on the prescription label. Take this medicine at the start of a meal or snack. Take your medicine at regular intervals. Do not take your medicine more often than directed. Take all of your medicine as directed even if you think you are better. Do not skip doses or stop your medicine early. Talk to your pediatrician regarding the use of this medicine in children. While this drug may be prescribed for selected conditions, precautions do apply. Overdosage: If you think you have taken too much of this medicine contact a poison control center or emergency room at once. NOTE: This medicine is only for you. Do not share this medicine with others. What if I miss a dose? If you miss a dose, take it as soon as you can. If it is almost time for your next dose, take only that dose. Do not take double or extra doses. What may interact with this medicine? -allopurinol -anticoagulants -birth control pills -methotrexate -probenecid This list may not describe all possible interactions. Give your health care provider a list of all the medicines, herbs, non-prescription drugs, or dietary supplements you use. Also tell them if you smoke, drink alcohol, or use illegal drugs. Some  items may interact with your medicine. What should I watch for while using this medicine? Tell your doctor or health care professional if your symptoms do not improve. Do not treat diarrhea with over the counter products. Contact your doctor if you have diarrhea that lasts more than 2 days or if it is severe  and watery. If you have diabetes, you may get a false-positive result for sugar in your urine. Check with your doctor or health care professional. Birth control pills may not work properly while you are taking this medicine. Talk to your doctor about using an extra method of birth control. What side effects may I notice from receiving this medicine? Side effects that you should report to your doctor or health care professional as soon as possible: -allergic reactions like skin rash, itching or hives, swelling of the face, lips, or tongue -breathing problems -dark urine -fever or chills, sore throat -redness, blistering, peeling or loosening of the skin, including inside the mouth -seizures -trouble passing urine or change in the amount of urine -unusual bleeding, bruising -unusually weak or tired -white patches or sores in the mouth or throat Side effects that usually do not require medical attention (report to your doctor or health care professional if they continue or are bothersome): -diarrhea -dizziness -headache -nausea, vomiting -stomach upset -vaginal or anal irritation This list may not describe all possible side effects. Call your doctor for medical advice about side effects. You may report side effects to FDA at 1-800-FDA-1088. Where should I keep my medicine? Keep out of the reach of children. Store at room temperature below 25 degrees C (77 degrees F). Keep container tightly closed. Throw away any unused medicine after the expiration date. NOTE: This sheet is a summary. It may not cover all possible information. If you have questions about this medicine, talk to your doctor, pharmacist, or health care provider.    2016, Elsevier/Gold Standard. (2007-07-19 11:38:22)

## 2015-04-30 NOTE — Progress Notes (Signed)
John Austin to be D/C'd Home per MD order.  Discussed prescriptions and follow up appointments with the patient. Prescriptions given to patient, medication list explained in detail. Pt verbalized understanding.    Medication List    STOP taking these medications        doxycycline 50 MG capsule  Commonly known as:  VIBRAMYCIN     peg 3350 powder 100 G Solr  Commonly known as:  MOVIPREP      TAKE these medications        amoxicillin-clavulanate 875-125 MG tablet  Commonly known as:  AUGMENTIN  Take 1 tablet by mouth 2 (two) times daily.     celecoxib 200 MG capsule  Commonly known as:  CELEBREX  Take 1 capsule by mouth daily as needed. Foot pain.     diltiazem 120 MG 24 hr capsule  Commonly known as:  CARDIZEM CD  Take 1 capsule by mouth at bedtime.     fexofenadine 180 MG tablet  Commonly known as:  ALLEGRA  Take 180 mg by mouth daily.     fluticasone 50 MCG/ACT nasal spray  Commonly known as:  FLONASE  Place 1 spray into both nostrils daily as needed. Allergies.     meclizine 25 MG tablet  Commonly known as:  ANTIVERT  Take 1 tablet by mouth daily as needed. dizziness        Filed Vitals:   04/29/15 2101 04/30/15 0515  BP: 131/65 115/65  Pulse: 61 65  Temp: 98 F (36.7 C) 98.2 F (36.8 C)  Resp: 17 126    Skin clean, dry and intact without evidence of skin break down, no evidence of skin tears noted. IV catheter discontinued intact. Site without signs and symptoms of complications. Dressing and pressure applied. Pt denies pain at this time. No complaints noted.  An After Visit Summary was printed and given to the patient. Patient escorted via [place], and D/C home via private auto.  John Austin 04/30/2015 10:37 AM

## 2015-04-30 NOTE — Discharge Summary (Signed)
Physician Discharge Summary  John Austin [id number] DOB: August 05, 1940 DOA: 04/27/2015  PCP: Cleda Mccreedy, MD  Admit date: 04/27/2015 Discharge date: 04/30/2015  Recommendations for Outpatient Follow-up:  Please continue Augmentin on discharge for 2 weeks If you develop any tongue swelling, numbness or tingling (facial area), difficulty breathing, hives, persistent rash and worsening rash please stop Augmentin immediately. Please call our unit [phone] or PCP or ED.  Discharge Diagnoses:  Principal Problem:   Acute diverticulitis Active Problems:   Leukocytosis   Abdominal pain, left lower quadrant   Essential hypertension    Discharge Condition: stable   Diet recommendation: as tolerated   History of present illness:  74 y.o. very pleasant gentleman with past medical history of relatively well managed diverticulosis, hypertension who presented to [facility] with reports of lower abdominal pain associated with low grade fevers. He was found to have diverticulitis and was started on Primaxin.  Hospital Course:   Assessment/Plan:    Principal Problem:  Acute diverticulitis / Leukocytosis - Tolerates solids - D/C primaxin prior to discharge  - Discharge with Augmentin, Dr. Johnnye Sima of ID recommends 2 weeks on d/c - Allergy apparently mild with rash on chest, no anaphylaxis to PCN  Active Problems:  Essential hypertension - BP controlled with Cardizem   DVT Prophylaxis  - Lovenox subQ ordered in hospital  Code Status: Full.  Family Communication: plan of care discussed with the patient and his wife at the bedside   IV access:  Peripheral IV  Procedures and diagnostic studies:   Ct Abdomen Pelvis W Contrast 04/27/2015 1. Acute diverticulitis of the distal descending colon. No abscess. 2. Cholelithiasis. 3. Hiatal hernia. Electronically Signed By: Logan Bores M.D. On: 04/27/2015 15:34   Medical Consultants:  None   Other Consultants:   None   IAnti-Infectives:   Primaxin 04/27/2015 --> 04/30/2015   Signed:  Leisa Lenz, MD  [facility] 04/30/2015, 10:16 AM  Pager #: [phone]  Time spent in minutes: less than 30 minutes  Discharge Exam: Filed Vitals:   04/29/15 2101 04/30/15 0515  BP: 131/65 115/65  Pulse: 61 65  Temp: 98 F (36.7 C) 98.2 F (36.8 C)  Resp: 17 126   Filed Vitals:   04/29/15 0530 04/29/15 1430 04/29/15 2101 04/30/15 0515  BP: 104/55 144/68 131/65 115/65  Pulse: 95 62 61 65  Temp: 98.2 F (36.8 C) 97.7 F (36.5 C) 98 F (36.7 C) 98.2 F (36.8 C)  TempSrc: Oral Oral Oral Oral  Resp: 20 18 17  126  Height:      Weight:      SpO2: 95% 100% 100% 97%    General: Pt is alert, follows commands appropriately, not in acute distress Cardiovascular: Regular rate and rhythm, S1/S2 +, no murmurs Respiratory: Clear to auscultation bilaterally, no wheezing, no crackles, no rhonchi Abdominal: tender in left lower abdomen without guarding  Extremities: no edema, no cyanosis, pulses palpable bilaterally DP and PT Neuro: Grossly nonfocal  Discharge Instructions  Discharge Instructions    Call MD for:  difficulty breathing, headache or visual disturbances    Complete by:  As directed      Call MD for:  hives    Complete by:  As directed      Call MD for:  persistant dizziness or light-headedness    Complete by:  As directed      Call MD for:  persistant nausea and vomiting    Complete by:  As directed      Call MD for:  redness, tenderness, or signs of infection (pain, swelling, redness, odor or green/yellow discharge around incision site)    Complete by:  As directed      Diet - low sodium heart healthy    Complete by:  As directed      Discharge instructions    Complete by:  As directed   Please continue Augmentin on discharge for 2 weeks If you develop any tongue swelling, numbness or tingling (facial area), difficulty breathing, hives, persistent rash and worsening  rash please stop Augmentin immediately. Please call our unit [phone] or PCP or ED.     Increase activity slowly    Complete by:  As directed             Medication List    STOP taking these medications        doxycycline 50 MG capsule  Commonly known as:  VIBRAMYCIN     peg 3350 powder 100 G Solr  Commonly known as:  MOVIPREP      TAKE these medications        amoxicillin-clavulanate 875-125 MG tablet  Commonly known as:  AUGMENTIN  Take 1 tablet by mouth 2 (two) times daily.     celecoxib 200 MG capsule  Commonly known as:  CELEBREX  Take 1 capsule by mouth daily as needed. Foot pain.     diltiazem 120 MG 24 hr capsule  Commonly known as:  CARDIZEM CD  Take 1 capsule by mouth at bedtime.     fexofenadine 180 MG tablet  Commonly known as:  ALLEGRA  Take 180 mg by mouth daily.     fluticasone 50 MCG/ACT nasal spray  Commonly known as:  FLONASE  Place 1 spray into both nostrils daily as needed. Allergies.     meclizine 25 MG tablet  Commonly known as:  ANTIVERT  Take 1 tablet by mouth daily as needed. dizziness           Follow-up Information    Follow up with Cleda Mccreedy, MD. Schedule an appointment as soon as possible for a visit in 2 weeks.   Specialty:  Internal Medicine   Why:  Follow up appt after recent hospitalization   Contact information:   [place] [phone]        The results of significant diagnostics from this hospitalization (including imaging, microbiology, ancillary and laboratory) are listed below for reference.    Significant Diagnostic Studies: Ct Abdomen Pelvis W Contrast  04/27/2015  CLINICAL DATA:  Left lower quadrant pain for 1 day. History of diverticulitis. EXAM: CT ABDOMEN AND PELVIS WITH CONTRAST TECHNIQUE: Multidetector CT imaging of the abdomen and pelvis was performed using the standard protocol following bolus administration of intravenous contrast. CONTRAST:  142mL OMNIPAQUE IOHEXOL  300 MG/ML  SOLN COMPARISON:  09/03/2008 FINDINGS: 3 mm right middle lobe nodule is unchanged and considered benign. The lung bases are free of consolidation or effusion. Small layering stones are again seen in the gallbladder. There is no biliary dilatation. The liver, spleen, adrenal glands, and pancreas have an unremarkable enhanced appearance. A 3.8 cm left lower pole renal cyst is larger than on the prior study (previously 2.9 cm). Additional subcentimeter hypodensities are noted both kidneys, likely cysts but too small to fully characterize. There is a moderate-size sliding hiatal hernia. Oral contrast is present in nondilated loops of small and large bowel to the level of the ascending colon without evidence of obstruction. Diverticulosis is again seen of  the descending and sigmoid colon. There is moderate wall thickening and surrounding inflammatory changes involving the distal descending colon. There is no evidence of frank bowel perforation or abscess. The appendix is unremarkable. The bladder is unremarkable. A retroaortic left renal vein is incidentally noted. Moderate atherosclerotic vascular calcification is present. No free fluid or enlarged lymph nodes are identified. A small amount of fat enters both inguinal canals. Moderate lumbar disc degeneration is noted. IMPRESSION: 1. Acute diverticulitis of the distal descending colon.  No abscess. 2. Cholelithiasis. 3. Hiatal hernia. Electronically Signed   By: Logan Bores M.D.   On: 04/27/2015 15:34    Microbiology: No results found for this or any previous visit (from the past 240 hour(s)).   Labs: Basic Metabolic Panel:  Recent Labs Lab 04/27/15 1309 04/27/15 1745 04/28/15 0450  NA 136  --  140  K 4.3  --  4.3  CL 103  --  107  CO2 26  --  25  GLUCOSE 119*  --  76  BUN 13  --  12  CREATININE 0.98 0.94 0.90  CALCIUM 9.5  --  8.8*   Liver Function Tests:  Recent Labs Lab 04/27/15 1309  AST 17  ALT 14*  ALKPHOS 64  BILITOT 1.2   PROT 7.4  ALBUMIN 4.1    Recent Labs Lab 04/27/15 1309  LIPASE 24   No results for input(s): AMMONIA in the last 168 hours. CBC:  Recent Labs Lab 04/27/15 1309 04/27/15 1745 04/28/15 0450  WBC 14.0* 10.3 8.1  HGB 15.1 13.8 12.9*  HCT 45.3 40.2 38.3*  MCV 81.2 80.9 81.7  PLT 272 221 208   Cardiac Enzymes: No results for input(s): CKTOTAL, CKMB, CKMBINDEX, TROPONINI in the last 168 hours. BNP: BNP (last 3 results) No results for input(s): BNP in the last 8760 hours.  ProBNP (last 3 results) No results for input(s): PROBNP in the last 8760 hours.  CBG: No results for input(s): GLUCAP in the last 168 hours.

## 2015-05-19 DIAGNOSIS — R5381 Other malaise: Secondary | ICD-10-CM | POA: Diagnosis not present

## 2015-05-19 DIAGNOSIS — N400 Enlarged prostate without lower urinary tract symptoms: Secondary | ICD-10-CM | POA: Diagnosis not present

## 2015-05-19 DIAGNOSIS — E782 Mixed hyperlipidemia: Secondary | ICD-10-CM | POA: Diagnosis not present

## 2015-05-19 DIAGNOSIS — R531 Weakness: Secondary | ICD-10-CM | POA: Diagnosis not present

## 2015-05-19 DIAGNOSIS — I10 Essential (primary) hypertension: Secondary | ICD-10-CM | POA: Diagnosis not present

## 2015-05-19 DIAGNOSIS — E7800 Pure hypercholesterolemia, unspecified: Secondary | ICD-10-CM | POA: Diagnosis not present

## 2015-05-19 DIAGNOSIS — R319 Hematuria, unspecified: Secondary | ICD-10-CM | POA: Diagnosis not present

## 2015-05-19 DIAGNOSIS — R7309 Other abnormal glucose: Secondary | ICD-10-CM | POA: Diagnosis not present

## 2015-05-19 DIAGNOSIS — Z79899 Other long term (current) drug therapy: Secondary | ICD-10-CM | POA: Diagnosis not present

## 2015-07-09 DIAGNOSIS — H524 Presbyopia: Secondary | ICD-10-CM | POA: Diagnosis not present

## 2015-07-09 DIAGNOSIS — H2513 Age-related nuclear cataract, bilateral: Secondary | ICD-10-CM | POA: Diagnosis not present

## 2015-07-20 DIAGNOSIS — H2511 Age-related nuclear cataract, right eye: Secondary | ICD-10-CM | POA: Diagnosis not present

## 2015-08-03 DIAGNOSIS — H2511 Age-related nuclear cataract, right eye: Secondary | ICD-10-CM | POA: Diagnosis not present

## 2015-08-03 DIAGNOSIS — H25811 Combined forms of age-related cataract, right eye: Secondary | ICD-10-CM | POA: Diagnosis not present

## 2015-08-10 DIAGNOSIS — E569 Vitamin deficiency, unspecified: Secondary | ICD-10-CM | POA: Diagnosis not present

## 2015-08-10 DIAGNOSIS — I10 Essential (primary) hypertension: Secondary | ICD-10-CM | POA: Diagnosis not present

## 2015-08-10 DIAGNOSIS — H2512 Age-related nuclear cataract, left eye: Secondary | ICD-10-CM | POA: Diagnosis not present

## 2015-08-10 DIAGNOSIS — Z79899 Other long term (current) drug therapy: Secondary | ICD-10-CM | POA: Diagnosis not present

## 2015-08-10 DIAGNOSIS — E119 Type 2 diabetes mellitus without complications: Secondary | ICD-10-CM | POA: Diagnosis not present

## 2015-08-10 DIAGNOSIS — E559 Vitamin D deficiency, unspecified: Secondary | ICD-10-CM | POA: Diagnosis not present

## 2015-08-10 DIAGNOSIS — R531 Weakness: Secondary | ICD-10-CM | POA: Diagnosis not present

## 2015-08-10 DIAGNOSIS — R5381 Other malaise: Secondary | ICD-10-CM | POA: Diagnosis not present

## 2015-08-17 DIAGNOSIS — H2512 Age-related nuclear cataract, left eye: Secondary | ICD-10-CM | POA: Diagnosis not present

## 2015-08-17 DIAGNOSIS — H25812 Combined forms of age-related cataract, left eye: Secondary | ICD-10-CM | POA: Diagnosis not present

## 2015-08-18 ENCOUNTER — Emergency Department (HOSPITAL_COMMUNITY): Payer: Medicare Other

## 2015-08-18 ENCOUNTER — Emergency Department (HOSPITAL_COMMUNITY)
Admission: EM | Admit: 2015-08-18 | Discharge: 2015-08-18 | Disposition: A | Discharge status: Home / Self Care | Payer: Medicare Other | Attending: Emergency Medicine | Admitting: Emergency Medicine

## 2015-08-18 ENCOUNTER — Encounter (HOSPITAL_COMMUNITY): Payer: Self-pay | Admitting: Emergency Medicine

## 2015-08-18 DIAGNOSIS — M546 Pain in thoracic spine: Secondary | ICD-10-CM | POA: Diagnosis not present

## 2015-08-18 DIAGNOSIS — R1011 Right upper quadrant pain: Secondary | ICD-10-CM | POA: Diagnosis not present

## 2015-08-18 DIAGNOSIS — I10 Essential (primary) hypertension: Secondary | ICD-10-CM | POA: Insufficient documentation

## 2015-08-18 DIAGNOSIS — K8020 Calculus of gallbladder without cholecystitis without obstruction: Secondary | ICD-10-CM | POA: Insufficient documentation

## 2015-08-18 DIAGNOSIS — Z79899 Other long term (current) drug therapy: Secondary | ICD-10-CM | POA: Insufficient documentation

## 2015-08-18 DIAGNOSIS — R079 Chest pain, unspecified: Secondary | ICD-10-CM | POA: Diagnosis not present

## 2015-08-18 LAB — COMPREHENSIVE METABOLIC PANEL
ALBUMIN: 4.2 g/dL (ref 3.5–5.0)
ALK PHOS: 63 U/L (ref 38–126)
ALT: 15 U/L — ABNORMAL LOW (ref 17–63)
ANION GAP: 6 (ref 5–15)
AST: 19 U/L (ref 15–41)
BILIRUBIN TOTAL: 0.7 mg/dL (ref 0.3–1.2)
BUN: 13 mg/dL (ref 6–20)
CALCIUM: 9.4 mg/dL (ref 8.9–10.3)
CO2: 29 mmol/L (ref 22–32)
Chloride: 107 mmol/L (ref 101–111)
Creatinine, Ser: 0.89 mg/dL (ref 0.61–1.24)
GFR calc Af Amer: >60 mL/min (ref >60)
GLUCOSE: 111 mg/dL — AB (ref 65–99)
POTASSIUM: 4.6 mmol/L (ref 3.5–5.1)
Sodium: 142 mmol/L (ref 135–145)
TOTAL PROTEIN: 6.9 g/dL (ref 6.5–8.1)

## 2015-08-18 LAB — URINALYSIS, ROUTINE W REFLEX MICROSCOPIC
BILIRUBIN URINE: NEGATIVE
Glucose, UA: NEGATIVE mg/dL
KETONES UR: NEGATIVE mg/dL
LEUKOCYTES UA: NEGATIVE
NITRITE: NEGATIVE
PROTEIN: NEGATIVE mg/dL
Specific Gravity, Urine: 1.012 (ref 1.005–1.030)
pH: 8 (ref 5.0–8.0)

## 2015-08-18 LAB — CBC
HCT: 43 % (ref 39.0–52.0)
Hemoglobin: 14.5 g/dL (ref 13.0–17.0)
MCH: 27.2 pg (ref 26.0–34.0)
MCHC: 33.7 g/dL (ref 30.0–36.0)
MCV: 80.7 fL (ref 78.0–100.0)
PLATELETS: 261 10*3/uL (ref 150–400)
RBC: 5.33 MIL/uL (ref 4.22–5.81)
RDW: 14.7 % (ref 11.5–15.5)
WBC: 7 10*3/uL (ref 4.0–10.5)

## 2015-08-18 LAB — URINE MICROSCOPIC-ADD ON

## 2015-08-18 LAB — LIPASE, BLOOD: Lipase: 25 U/L (ref 11–51)

## 2015-08-18 LAB — I-STAT TROPONIN, ED
TROPONIN I, POC: 0 ng/mL (ref 0.00–0.08)
TROPONIN I, POC: 0 ng/mL (ref 0.00–0.08)

## 2015-08-18 MED ORDER — DICYCLOMINE HCL 20 MG PO TABS: 20.0000 mg | ORAL_TABLET | Freq: Two times a day (BID) | ORAL | Status: DC

## 2015-08-18 NOTE — ED Notes (Signed)
Bed: WA16 Expected date:  Expected time:  Means of arrival:  Comments: Hold for triage 1 

## 2015-08-18 NOTE — Discharge Instructions (Signed)

## 2015-08-18 NOTE — ED Notes (Signed)
Per pt, states rigth upper abdominal pian on and off for 2 weeks-radiates to his back-nausea

## 2015-08-18 NOTE — ED Notes (Addendum)
Pt. Unable to urinate at this time. Will collect urine when pt. Voids. Nurse aware.  

## 2015-08-18 NOTE — ED Notes (Signed)
Patient is alert and oriented x3.  He was given DC instructions and follow up visit instructions.  Patient gave verbal understanding.  He was DC ambulatory under his own power to home.  V/S stable.  He was not showing any signs of distress on DC 

## 2015-08-18 NOTE — ED Provider Notes (Signed)
CSN: [id number]     Arrival date & time 08/18/15  [id number] History   First MD Initiated Contact with Patient 08/18/15 [phone]     Chief Complaint  Patient presents with  . Abdominal Pain     (Consider location/radiation/quality/duration/timing/severity/associated sxs/prior Treatment) Patient is a 75 y.o. male presenting with abdominal pain. The history is provided by the patient.  Abdominal Pain Pain location:  RUQ Pain quality: cramping   Pain quality comment:  Grabbing pain Pain radiates to:  Back Pain severity:  Severe (this AM, 6AM got worse, hard to get comfortable) Duration:  2 weeks Timing:  Intermittent Progression:  Waxing and waning Context comment:  Usually after sitting down, not worse with eating Exacerbated by: sitting or laying. Associated symptoms: nausea   Associated symptoms: no anorexia, no chest pain, no constipation, no diarrhea, no dysuria, no fever, no shortness of breath, no sore throat and no vomiting  Hematuria: chronic.   Risk factors: has not had multiple surgeries     Past Medical History  Diagnosis Date  . Essential hypertension    Past Surgical History  Procedure Laterality Date  . Colonoscopy    . Bilateral shoulder surgery    . Right foot surgery     No family history on file. Social History  Substance Use Topics  . Smoking status: Never Smoker   . Smokeless tobacco: Current User    Types: Chew  . Alcohol Use: No    Review of Systems  Constitutional: Negative for fever.  HENT: Negative for sore throat.   Eyes: Negative for visual disturbance (just had cataract surgery yesterday).  Respiratory: Negative for shortness of breath.   Cardiovascular: Negative for chest pain.  Gastrointestinal: Positive for nausea and abdominal pain. Negative for vomiting, diarrhea, constipation and anorexia.  Genitourinary: Negative for dysuria and difficulty urinating. Hematuria: chronic.  Musculoskeletal: Positive for back pain. Negative for neck stiffness.   Skin: Negative for rash.  Neurological: Negative for syncope and headaches.      Allergies  Ciprofloxacin and Flagyl  Home Medications   Prior to Admission medications   Medication Sig Start Date End Date Taking? Authorizing Provider  BESIVANCE 0.6 % SUSP Place 1 drop into the left eye 4 (four) times daily. Start 1 day prior to surgery and continue until gone 08/10/15  Yes Historical Provider, MD  Bromfenac Sodium (BROMSITE) 0.075 % SOLN Place 1 drop into the left eye 2 (two) times daily.   Yes Historical Provider, MD  celecoxib (CELEBREX) 200 MG capsule Take 200 mg by mouth daily as needed (foot pain). Foot pain. 03/31/15  Yes Historical Provider, MD  diltiazem (CARDIZEM CD) 120 MG 24 hr capsule Take 120 mg by mouth at bedtime.  04/25/15  Yes Historical Provider, MD  DUREZOL 0.05 % EMUL Place 1 drop into the left eye 2 (two) times daily. After surgery 08/10/15  Yes Historical Provider, MD  fexofenadine (ALLEGRA) 180 MG tablet Take 180 mg by mouth daily as needed for allergies.    Yes Historical Provider, MD  fluticasone (FLONASE) 50 MCG/ACT nasal spray Place 1 spray into both nostrils daily as needed. Allergies. 03/30/15  Yes Historical Provider, MD  meclizine (ANTIVERT) 25 MG tablet Take 1 tablet by mouth daily as needed. dizziness 02/02/15  Yes Historical Provider, MD  amoxicillin-clavulanate (AUGMENTIN) 875-125 MG tablet Take 1 tablet by mouth 2 (two) times daily. Patient not taking: Reported on 08/18/2015 04/30/15   Robbie Lis, MD  dicyclomine (BENTYL) 20 MG tablet Take 1 tablet (  20 mg total) by mouth 2 (two) times daily. 08/18/15   Gareth Morgan, MD   BP 138/79 mmHg  Pulse 63  Temp(Src) 97.8 F (36.6 C) (Oral)  Resp 16  SpO2 100% Physical Exam  Constitutional: He is oriented to person, place, and time. He appears well-developed and well-nourished. No distress.  HENT:  Head: Normocephalic and atraumatic.  Eyes: Conjunctivae and EOM are normal.  Neck: Normal range of motion.   Cardiovascular: Normal rate, regular rhythm, normal heart sounds and intact distal pulses.  Exam reveals no gallop and no friction rub.   No murmur heard. Pulmonary/Chest: Effort normal and breath sounds normal. No respiratory distress. He has no wheezes. He has no rales.  Abdominal: Soft. He exhibits no distension. There is no tenderness (reports right lower rib tenderness, however denies abdominal tenderness). There is CVA tenderness (right). There is no guarding and negative Murphy's sign.  Musculoskeletal: He exhibits no edema.  Neurological: He is alert and oriented to person, place, and time.  Skin: Skin is warm and dry. He is not diaphoretic.  Nursing note and vitals reviewed.   ED Course  Procedures (including critical care time) Labs Review Labs Reviewed  COMPREHENSIVE METABOLIC PANEL - Abnormal; Notable for the following:    Glucose, Bld 111 (*)    ALT 15 (*)    All other components within normal limits  URINALYSIS, ROUTINE W REFLEX MICROSCOPIC (NOT AT [facility]) - Abnormal; Notable for the following:    APPearance TURBID (*)    Hgb urine dipstick TRACE (*)    All other components within normal limits  URINE MICROSCOPIC-ADD ON - Abnormal; Notable for the following:    Squamous Epithelial / LPF 0-5 (*)    Bacteria, UA FEW (*)    All other components within normal limits  LIPASE, BLOOD  CBC  I-STAT TROPOININ, ED  Randolm Idol, ED    Imaging Review Dg Chest 2 View  08/18/2015  CLINICAL DATA:  Right-sided chest pain for 3 weeks. EXAM: CHEST  2 VIEW COMPARISON:  None. FINDINGS: The heart size and mediastinal contours are within normal limits. Both lungs are clear. No pneumothorax or pleural effusion is noted. Degenerative changes seen involving both glenohumeral joints. IMPRESSION: No active cardiopulmonary disease. Electronically Signed   By: Marijo Conception, M.D.   On: 08/18/2015 10:44   Dg Thoracic Spine 2 View  08/18/2015  CLINICAL DATA:  Right lower chest pain, back  pain EXAM: THORACIC SPINE 2 VIEWS COMPARISON:  08/18/2015 lateral view of the chest FINDINGS: Three views of thoracic spine submitted. No acute fracture or subluxation. There are degenerative changes mid and lower thoracic spine with multilevel anterior spurring and multilevel mild disc space flattening. Alignment and vertebral body heights are preserved. IMPRESSION: No acute fracture or subluxation. Degenerative changes mid and lower thoracic spine. Electronically Signed   By: Lahoma Crocker M.D.   On: 08/18/2015 10:53   Ct Renal Stone Study  08/18/2015  CLINICAL DATA:  75 year old hypertensive male with right upper quadrant pain off and on for the past 2 weeks radiating to back with nausea. Initial encounter. EXAM: CT ABDOMEN AND PELVIS WITHOUT CONTRAST TECHNIQUE: Multidetector CT imaging of the abdomen and pelvis was performed following the standard protocol without IV contrast. COMPARISON:  04/27/2015 and 09/03/2008 CT. FINDINGS: Three small gallstones without CT evidence of cholecystitis. If this were of concern, ultrasound may be considered for further delineation. No calcified common bile duct stone or CT evidence of pancreatic inflammation. No renal or ureteral  obstructing stone. Left renal 3 cm cyst once again noted. Within the limits of noncontrast imaging, no worrisome hepatic, splenic, adrenal, renal or pancreatic lesion. Hiatal hernia. Under distended stomach without gross abnormality. Significant colonic diverticula without evidence of extra luminal bowel inflammatory process, free fluid or free air. Coronary artery calcifications.  Cardiomegaly. Scarring lung bases. Tiny nodule right middle lobe unchanged from 20/10. Atherosclerotic changes of the aorta and iliac arteries with ectasia. Common iliac arteries measure up to 1.7 cm. No adenopathy. Lobulated prostate gland with impression upon the bladder base with thickening along the bladder base. Clinical and laboratory correlation recommended.  Degenerative changes lower thoracic lumbar spine various degrees of spinal stenosis and foraminal narrowing. Curvature of the lumbar spine convex to the right. Mild hip and sacroiliac joint degenerative changes. IMPRESSION: Three small gallstones without CT evidence of cholecystitis. If this were of concern, ultrasound may be considered for further delineation. No calcified common bile duct stone or CT evidence of pancreatic inflammation. No renal or ureteral obstructing stone. Left renal 3 cm cyst once again noted. Hiatal hernia.  Under distended stomach without gross abnormality. Significant colonic diverticula without evidence of extra luminal bowel inflammatory process, free fluid or free air. Coronary artery calcifications.  Cardiomegaly. Scarring lung bases. Tiny nodule right middle lobe unchanged from 2010. Atherosclerotic changes of the aorta and iliac arteries with ectasia. Common iliac arteries measure up to 1.7 cm. Lobulated prostate gland with impression upon the bladder base with thickening along the bladder base. Clinical and laboratory correlation recommended. Degenerative changes lower thoracic lumbar spine various degrees of spinal stenosis and foraminal narrowing. Curvature of the lumbar spine convex to the right. Mild hip and sacroiliac joint degenerative changes. Electronically Signed   By: Genia Del M.D.   On: 08/18/2015 11:52   I have personally reviewed and evaluated these images and lab results as part of my medical decision-making.   EKG Interpretation   Date/Time:  Tuesday August 18 2015 10:45:40 EDT Ventricular Rate:  56 PR Interval:  180 QRS Duration: 83 QT Interval:  429 QTC Calculation: 414 R Axis:   53 Text Interpretation:  Sinus rhythm Anterior infarct, old Borderline T  abnormalities, inferior leads No previous ECGs available Confirmed by  [facility] MD, Ezra Marquess ([id number]) on 08/18/2015 11:09:31 AM      MDM   Final diagnoses:  Right upper quadrant pain  Calculus  of gallbladder without cholecystitis without obstruction   75 year old male with a history of hypertension presents to concern of right upper quadrant pain radiating to the back intermittently for 2 weeks. Patient does report pain at this time, however on exam does not have any right upper quadrant tenderness, has a negative Murphy sign, and doubt cholecystitis. CMP and lipase are ordered and were WNL. He does not have shortness of breath, and doubt PE. Pain would be atypical for cardiac pain, however EKG, and troponin were ordered which showed no significant ECG changes and negative delta troponins. Overall low suspicion for ACS or angina given non exertional symptoms, reproducible pain over right lower ribs and CVA and location of pain.  CT stone study showed cholelithiasis without CT evidence of cholecystitis.  Again, location described of pain is concerning for symptomatic cholelithiasis, however his abdomen is soft, nontender with more tenderness over ribs. Patient without fever, no vomiting, no significant abdominal tenderness, no leukocytosis and doubt cholecystitis.  Provided number for [facility] for follow up for possible symptomatic cholelithiasis. Other etiologies of pain could be rib contusion, muscle strain.  Given bentyl rx for pain. Patient discharged in stable condition with understanding of reasons to return.   Gareth Morgan, MD 08/18/15 2147

## 2015-08-20 ENCOUNTER — Emergency Department (HOSPITAL_COMMUNITY)
Admission: EM | Admit: 2015-08-20 | Discharge: 2015-08-20 | Disposition: A | Discharge status: Home / Self Care | Payer: Medicare Other | Attending: Emergency Medicine | Admitting: Emergency Medicine

## 2015-08-20 ENCOUNTER — Encounter (HOSPITAL_COMMUNITY): Payer: Self-pay | Admitting: Emergency Medicine

## 2015-08-20 ENCOUNTER — Emergency Department (HOSPITAL_COMMUNITY): Payer: Medicare Other

## 2015-08-20 DIAGNOSIS — I10 Essential (primary) hypertension: Secondary | ICD-10-CM | POA: Diagnosis not present

## 2015-08-20 DIAGNOSIS — Z791 Long term (current) use of non-steroidal anti-inflammatories (NSAID): Secondary | ICD-10-CM | POA: Diagnosis not present

## 2015-08-20 DIAGNOSIS — Z7982 Long term (current) use of aspirin: Secondary | ICD-10-CM | POA: Diagnosis not present

## 2015-08-20 DIAGNOSIS — R1011 Right upper quadrant pain: Secondary | ICD-10-CM | POA: Diagnosis not present

## 2015-08-20 DIAGNOSIS — K8020 Calculus of gallbladder without cholecystitis without obstruction: Secondary | ICD-10-CM

## 2015-08-20 DIAGNOSIS — B029 Zoster without complications: Secondary | ICD-10-CM | POA: Insufficient documentation

## 2015-08-20 LAB — I-STAT TROPONIN, ED: Troponin i, poc: 0 ng/mL (ref 0.00–0.08)

## 2015-08-20 LAB — CBC WITH DIFFERENTIAL/PLATELET
BASOS ABS: 0 10*3/uL (ref 0.0–0.1)
BASOS PCT: 1 %
EOS ABS: 0.3 10*3/uL (ref 0.0–0.7)
Eosinophils Relative: 4 %
HCT: 41.2 % (ref 39.0–52.0)
HEMOGLOBIN: 13.9 g/dL (ref 13.0–17.0)
Lymphocytes Relative: 24 %
Lymphs Abs: 1.8 10*3/uL (ref 0.7–4.0)
MCH: 26.5 pg (ref 26.0–34.0)
MCHC: 33.7 g/dL (ref 30.0–36.0)
MCV: 78.5 fL (ref 78.0–100.0)
MONO ABS: 0.9 10*3/uL (ref 0.1–1.0)
MONOS PCT: 12 %
NEUTROS ABS: 4.4 10*3/uL (ref 1.7–7.7)
NEUTROS PCT: 59 %
Platelets: 214 10*3/uL (ref 150–400)
RBC: 5.25 MIL/uL (ref 4.22–5.81)
RDW: 14.4 % (ref 11.5–15.5)
WBC: 7.5 10*3/uL (ref 4.0–10.5)

## 2015-08-20 LAB — URINALYSIS, ROUTINE W REFLEX MICROSCOPIC
Bilirubin Urine: NEGATIVE
Glucose, UA: NEGATIVE mg/dL
Ketones, ur: NEGATIVE mg/dL
LEUKOCYTES UA: NEGATIVE
NITRITE: NEGATIVE
PH: 8 (ref 5.0–8.0)
Protein, ur: NEGATIVE mg/dL
SPECIFIC GRAVITY, URINE: 1.007 (ref 1.005–1.030)

## 2015-08-20 LAB — URINE MICROSCOPIC-ADD ON
BACTERIA UA: NONE SEEN
SQUAMOUS EPITHELIAL / LPF: NONE SEEN
WBC UA: NONE SEEN WBC/hpf (ref 0–5)

## 2015-08-20 LAB — COMPREHENSIVE METABOLIC PANEL
ALBUMIN: 4 g/dL (ref 3.5–5.0)
ALT: 16 U/L — ABNORMAL LOW (ref 17–63)
ANION GAP: 10 (ref 5–15)
AST: 24 U/L (ref 15–41)
Alkaline Phosphatase: 64 U/L (ref 38–126)
BUN: 11 mg/dL (ref 6–20)
CO2: 25 mmol/L (ref 22–32)
Calcium: 9.3 mg/dL (ref 8.9–10.3)
Chloride: 106 mmol/L (ref 101–111)
Creatinine, Ser: 0.84 mg/dL (ref 0.61–1.24)
GFR calc Af Amer: >60 mL/min (ref >60)
GFR calc non Af Amer: >60 mL/min (ref >60)
GLUCOSE: 102 mg/dL — AB (ref 65–99)
POTASSIUM: 4.3 mmol/L (ref 3.5–5.1)
SODIUM: 141 mmol/L (ref 135–145)
Total Bilirubin: 1.1 mg/dL (ref 0.3–1.2)
Total Protein: 6.9 g/dL (ref 6.5–8.1)

## 2015-08-20 LAB — LIPASE, BLOOD: Lipase: 27 U/L (ref 11–51)

## 2015-08-20 MED ORDER — HYDROCODONE-ACETAMINOPHEN 5-325 MG PO TABS: 1.0000 | ORAL_TABLET | Freq: Four times a day (QID) | ORAL | Status: DC | PRN

## 2015-08-20 MED ORDER — SODIUM CHLORIDE 0.9 % IV BOLUS (SEPSIS)
1000.0000 mL | Freq: Once | INTRAVENOUS | Status: AC
  Administered 2015-08-20: 1000 mL via INTRAVENOUS

## 2015-08-20 MED ORDER — ONDANSETRON HCL 4 MG/2ML IJ SOLN
4.0000 mg | Freq: Once | INTRAMUSCULAR | Status: AC
  Administered 2015-08-20: 4 mg via INTRAVENOUS
  Filled 2015-08-20: qty 2

## 2015-08-20 MED ORDER — ACYCLOVIR 400 MG PO TABS: 800.0000 mg | ORAL_TABLET | Freq: Every day | ORAL | Status: DC

## 2015-08-20 MED ORDER — MORPHINE SULFATE (PF) 4 MG/ML IV SOLN
4.0000 mg | Freq: Once | INTRAVENOUS | Status: AC
  Administered 2015-08-20: 4 mg via INTRAVENOUS
  Filled 2015-08-20: qty 1

## 2015-08-20 NOTE — Discharge Instructions (Signed)
Return for worsening symptoms including fevers, worsening pain, vomiting unable to keep down food or fluids, or any other symptoms concerning to you.  Biliary Colic Biliary colic is a pain in the upper abdomen. The pain:  Is usually felt on the right side of the abdomen, but it may also be felt in the center of the abdomen, just below the breastbone (sternum).  May spread back toward the right shoulder blade.  May be steady or irregular.  May be accompanied by nausea and vomiting. Most of the time, the pain goes away in 1-5 hours. After the most intense pain passes, the abdomen may continue to ache mildly for about 24 hours. Biliary colic is caused by a blockage in the bile duct. The bile duct is a pathway that carries bile--a liquid that helps to digest fats--from the gallbladder to the small intestine. Biliary colic usually occurs after eating, when the digestive system demands bile. The pain develops when muscle cells contract forcefully to try to move the blockage so that bile can get by. HOME CARE INSTRUCTIONS  Take medicines only as directed by your health care provider.  Drink enough fluid to keep your urine clear or pale yellow.  Avoid fatty, greasy, and fried foods. These kinds of foods increase your body's demand for bile.  Avoid any foods that make your pain worse.  Avoid overeating.  Avoid having a large meal after fasting. SEEK MEDICAL CARE IF:  You develop a fever.  Your pain gets worse.  You vomit.  You develop nausea that prevents you from eating and drinking. SEEK IMMEDIATE MEDICAL CARE IF:  You suddenly develop a fever and shaking chills.  You develop a yellowish discoloration (jaundice) of:  Skin.  Whites of the eyes.  Mucous membranes.  You have continuous or severe pain that is not relieved with medicines.  You have nausea and vomiting that is not relieved with medicines.  You develop dizziness or you faint.   This information is not intended  to replace advice given to you by your health care provider. Make sure you discuss any questions you have with your health care provider.   Document Released: 09/26/2005 Document Revised: 09/09/2014 Document Reviewed: 02/04/2014 Elsevier Interactive Patient Education 2016 [place], which is also known as herpes zoster, is an infection that causes a painful skin rash and fluid-filled blisters. Shingles is not related to genital herpes, which is a sexually transmitted infection.   Shingles only develops in people who:  Have had chickenpox.  Have received the chickenpox vaccine. (This is rare.) CAUSES Shingles is caused by varicella-zoster virus (VZV). This is the same virus that causes chickenpox. After exposure to VZV, the virus stays in the body in an inactive (dormant) state. Shingles develops if the virus reactivates. This can happen many years after the initial exposure to VZV. It is not known what causes this virus to reactivate. RISK FACTORS People who have had chickenpox or received the chickenpox vaccine are at risk for shingles. Infection is more common in people who:  Are older than age 44.  Have a weakened defense (immune) system, such as those with HIV, AIDS, or cancer.  Are taking medicines that weaken the immune system, such as transplant medicines.  Are under great stress. SYMPTOMS Early symptoms of this condition include itching, tingling, and pain in an area on your skin. Pain may be described as burning, stabbing, or throbbing. A few days or weeks after symptoms start, a painful red rash  appears, usually on one side of the body in a bandlike or beltlike pattern. The rash eventually turns into fluid-filled blisters that break open, scab over, and dry up in about 2-3 weeks. At any time during the infection, you may also develop:  A fever.  Chills.  A headache.  An upset stomach. DIAGNOSIS This condition is diagnosed with a skin exam.  Sometimes, skin or fluid samples are taken from the blisters before a diagnosis is made. These samples are examined under a microscope or sent to a lab for testing. TREATMENT There is no specific cure for this condition. Your health care provider will probably prescribe medicines to help you manage pain, recover more quickly, and avoid long-term problems. Medicines may include:  Antiviral drugs.  Anti-inflammatory drugs.  Pain medicines. If the area involved is on your face, you may be referred to a specialist, such as an eye doctor (ophthalmologist) or an ear, nose, and throat (ENT) doctor to help you avoid eye problems, chronic pain, or disability. HOME CARE INSTRUCTIONS Medicines  Take medicines only as directed by your health care provider.  Apply an anti-itch or numbing cream to the affected area as directed by your health care provider. Blister and Rash Care  Take a cool bath or apply cool compresses to the area of the rash or blisters as directed by your health care provider. This may help with pain and itching.  Keep your rash covered with a loose bandage (dressing). Wear loose-fitting clothing to help ease the pain of material rubbing against the rash.  Keep your rash and blisters clean with mild soap and cool water or as directed by your health care provider.  Check your rash every day for signs of infection. These include redness, swelling, and pain that lasts or increases.  Do not pick your blisters.  Do not scratch your rash. General Instructions  Rest as directed by your health care provider.  Keep all follow-up visits as directed by your health care provider. This is important.  Until your blisters scab over, your infection can cause chickenpox in people who have never had it or been vaccinated against it. To prevent this from happening, avoid contact with other people, especially:  Babies.  Pregnant women.  Children who have eczema.  Elderly people who have  transplants.  People who have chronic illnesses, such as leukemia or AIDS. SEEK MEDICAL CARE IF:  Your pain is not relieved with prescribed medicines.  Your pain does not get better after the rash heals.  Your rash looks infected. Signs of infection include redness, swelling, and pain that lasts or increases. SEEK IMMEDIATE MEDICAL CARE IF:  The rash is on your face or nose.  You have facial pain, pain around your eye area, or loss of feeling on one side of your face.  You have ear pain or you have ringing in your ear.  You have loss of taste.  Your condition gets worse.   This information is not intended to replace advice given to you by your health care provider. Make sure you discuss any questions you have with your health care provider.   Document Released: 04/25/2005 Document Revised: 05/16/2014 Document Reviewed: 03/06/2014 Elsevier Interactive Patient Education [employer].

## 2015-08-20 NOTE — ED Notes (Signed)
Patient here from home with complaint of abdominal pain. Denies n/v. Reports being diagnosed with gallstones on Monday. Reports flareup after eating.

## 2015-08-20 NOTE — ED Notes (Signed)
Ultra SOund in room

## 2015-08-20 NOTE — ED Provider Notes (Signed)
CSN: [id number]     Arrival date & time 08/20/15  1019 History   First MD Initiated Contact with Patient 08/20/15 1111     Chief Complaint  Patient presents with  . Abdominal Pain     (Consider location/radiation/quality/duration/timing/severity/associated sxs/prior Treatment) HPI 75 year old male who presents with right upper quadrant abdominal pain. Recently seen in the ED 2 days ago with right upper quadrant abdominal pain that radiated towards his back. 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 States that he noticed a small rash over his back where he was having pain radiating over the course of the past 24 hours. After having a fatty meal yesterday evening developed right upper quadrant abdominal pain with nausea overnight. He did not take any pain medication but pain slowly improving. Denies any vomiting, diarrhea, fevers or chills. No chest pain or difficulty breathing. Past Medical History  Diagnosis Date  . Essential hypertension    Past Surgical History  Procedure Laterality Date  . Colonoscopy    . Bilateral shoulder surgery    . Right foot surgery     History reviewed. No pertinent family history. Social History  Substance Use Topics  . Smoking status: Never Smoker   . Smokeless tobacco: Current User    Types: Chew  . Alcohol Use: No    Review of Systems 10/14 systems reviewed and are negative other than those stated in the HPI   Allergies  Ciprofloxacin and Flagyl  Home Medications   Prior to Admission medications   Medication Sig Start Date End Date Taking? Authorizing Provider  acetaminophen (TYLENOL) 500 MG tablet Take 500 mg by mouth every 6 (six) hours as needed for mild pain or headache.   Yes Historical Provider, MD  aspirin EC 81 MG tablet Take 81 mg by mouth at bedtime.   Yes Historical Provider, MD  BESIVANCE 0.6 % SUSP Place 1 drop into the left eye 4 (four) times daily. Start 1 day prior to surgery and  continue until gone 08/10/15  Yes Historical Provider, MD  Bromfenac Sodium (BROMSITE) 0.075 % SOLN Place 1 drop into the left eye 2 (two) times daily.   Yes Historical Provider, MD  celecoxib (CELEBREX) 200 MG capsule Take 200 mg by mouth daily as needed (foot pain). Foot pain. 03/31/15  Yes Historical Provider, MD  dicyclomine (BENTYL) 20 MG tablet Take 1 tablet (20 mg total) by mouth 2 (two) times daily. 08/18/15  Yes Gareth Morgan, MD  diltiazem (CARDIZEM CD) 120 MG 24 hr capsule Take 120 mg by mouth at bedtime.  04/25/15  Yes Historical Provider, MD  docusate sodium (COLACE) 100 MG capsule Take 200 mg by mouth at bedtime.   Yes Historical Provider, MD  DUREZOL 0.05 % EMUL Place 1 drop into the left eye 2 (two) times daily. After surgery 08/10/15  Yes Historical Provider, MD  fexofenadine (ALLEGRA) 180 MG tablet Take 180 mg by mouth daily as needed for allergies.    Yes Historical Provider, MD  fluticasone (FLONASE) 50 MCG/ACT nasal spray Place 1 spray into both nostrils daily as needed. Allergies. 03/30/15  Yes Historical Provider, MD  meclizine (ANTIVERT) 25 MG tablet Take 12.5-25 mg by mouth daily as needed. dizziness 02/02/15  Yes Historical Provider, MD  acyclovir (ZOVIRAX) 400 MG tablet Take 2 tablets (800 mg total) by mouth 5 (five) times daily. 08/20/15   Forde Dandy, MD  HYDROcodone-acetaminophen (NORCO/VICODIN) 5-325 MG tablet Take 1 tablet by mouth every 6 (  six) hours as needed for moderate pain or severe pain. 08/20/15   Forde Dandy, MD   BP 126/72 mmHg  Pulse 58  Temp(Src) 98.5 F (36.9 C) (Oral)  Resp 16  SpO2 99% Physical Exam   Physical Exam: Nursing note and vitals reviewed. Constitutional: Well developed, well nourished, non-toxic, and in no acute distress Head: Normocephalic and atraumatic.  Mouth/Throat: Oropharynx is clear and moist.  Neck: Normal range of motion. Neck supple.  Cardiovascular: Normal rate and regular rhythm.   Pulmonary/Chest: Effort normal and breath  sounds normal.  Abdominal: Soft. There is no tenderness. There is no rebound and no guarding.  Musculoskeletal: Normal range of motion.  Neurological: Alert, no facial droop, fluent speech, moves all extremities symmetrically Skin: Skin is warm and dry. Two patches of erythematous vesicular rash over right flank.  Psychiatric: Cooperative  ED Course  Procedures (including critical care time) Labs Review Labs Reviewed  COMPREHENSIVE METABOLIC PANEL - Abnormal; Notable for the following:    Glucose, Bld 102 (*)    ALT 16 (*)    All other components within normal limits  URINALYSIS, ROUTINE W REFLEX MICROSCOPIC (NOT AT [facility]) - Abnormal; Notable for the following:    APPearance CLOUDY (*)    Hgb urine dipstick SMALL (*)    All other components within normal limits  CBC WITH DIFFERENTIAL/PLATELET  LIPASE, BLOOD  URINE MICROSCOPIC-ADD ON  Randolm Idol, ED    Imaging Review US Abdomen Limited Ruq  08/20/2015  CLINICAL DATA:  Right upper quadrant pain for 2 weeks EXAM: US ABDOMEN LIMITED - RIGHT UPPER QUADRANT COMPARISON:  08/18/2015 CT scan FINDINGS: Gallbladder: Gallstones are noted in gallbladder neck region the largest measures 1.2 cm. No thickening of gallbladder wall. Common bile duct: Diameter: 2.5 mm in diameter within normal limits. Liver: No focal lesion identified. Within normal limits in parenchymal echogenicity. IMPRESSION: Gallstones are noted within gallbladder neck region the largest measures 1.2 cm. No thickening of gallbladder wall. There is no sonographic Murphy's sign. Normal CBD. Electronically Signed   By: Lahoma Crocker M.D.   On: 08/20/2015 12:26   I have personally reviewed and evaluated these images and lab results as part of my medical decision-making.   EKG Interpretation   Date/Time:  Thursday August 20 2015 12:26:45 EDT Ventricular Rate:  54 PR Interval:  188 QRS Duration: 86 QT Interval:  446 QTC Calculation: 423 R Axis:   19 Text Interpretation:  Sinus  rhythm No acute changes  Confirmed by Haley Fuerstenberg MD,  Hinton Dyer [id number]) on 08/20/2015 1:13:49 PM      MDM   Final diagnoses:  RUQ discomfort  Calculus of gallbladder without cholecystitis without obstruction  Shingles    75 year old male who presents with right upper quadrant abdominal pain. On presentation is nontoxic in no acute distress. Afebrile with normal vital signs. No tenderness currently on exam, but in the setting of post prandial abdominal pain I was suggestive of biliary colic which she was diagnosed with 2 days ago. No CVA tenderness noted and no urinary symptoms, but he does incidentally have evidence of shingles overlying the right flank where he was having pain 2 days ago radiating from the anterior right upper quadrant. Also likely etiology of symptoms. Basic blood work including CBC, comp metabolic panel, lipase and urinalysis are unremarkable. Ultrasound reveals cholelithiasis without choledocholithiasis or cholecystitis. He is given referral for general surgery to discuss elective cholecystectomy. Is given a course of acyclovir and analgesics for home for treatment of shingles. Strict  return and follow-up instructions are reviewed. He expressed understanding of all discharge instructions and felt comfortable to plan of care    Forde Dandy, MD 08/20/15 1645

## 2015-08-28 DIAGNOSIS — M170 Bilateral primary osteoarthritis of knee: Secondary | ICD-10-CM | POA: Diagnosis not present

## 2015-10-13 DIAGNOSIS — D485 Neoplasm of uncertain behavior of skin: Secondary | ICD-10-CM | POA: Diagnosis not present

## 2015-10-13 DIAGNOSIS — L821 Other seborrheic keratosis: Secondary | ICD-10-CM | POA: Diagnosis not present

## 2015-10-13 DIAGNOSIS — L570 Actinic keratosis: Secondary | ICD-10-CM | POA: Diagnosis not present

## 2015-10-13 DIAGNOSIS — D044 Carcinoma in situ of skin of scalp and neck: Secondary | ICD-10-CM | POA: Diagnosis not present

## 2015-10-19 DIAGNOSIS — K5792 Diverticulitis of intestine, part unspecified, without perforation or abscess without bleeding: Secondary | ICD-10-CM | POA: Diagnosis not present

## 2015-11-19 DIAGNOSIS — D044 Carcinoma in situ of skin of scalp and neck: Secondary | ICD-10-CM | POA: Diagnosis not present

## 2015-12-16 DIAGNOSIS — M25562 Pain in left knee: Secondary | ICD-10-CM | POA: Diagnosis not present

## 2015-12-16 DIAGNOSIS — I10 Essential (primary) hypertension: Secondary | ICD-10-CM | POA: Diagnosis not present

## 2016-01-12 DIAGNOSIS — H1045 Other chronic allergic conjunctivitis: Secondary | ICD-10-CM | POA: Diagnosis not present

## 2016-01-12 DIAGNOSIS — Z91018 Allergy to other foods: Secondary | ICD-10-CM | POA: Diagnosis not present

## 2016-01-12 DIAGNOSIS — J3089 Other allergic rhinitis: Secondary | ICD-10-CM | POA: Diagnosis not present

## 2016-01-12 DIAGNOSIS — T783XXD Angioneurotic edema, subsequent encounter: Secondary | ICD-10-CM | POA: Diagnosis not present

## 2016-02-11 DIAGNOSIS — Z23 Encounter for immunization: Secondary | ICD-10-CM | POA: Diagnosis not present

## 2016-02-18 DIAGNOSIS — D044 Carcinoma in situ of skin of scalp and neck: Secondary | ICD-10-CM | POA: Diagnosis not present

## 2016-02-18 DIAGNOSIS — D485 Neoplasm of uncertain behavior of skin: Secondary | ICD-10-CM | POA: Diagnosis not present

## 2016-02-18 DIAGNOSIS — L570 Actinic keratosis: Secondary | ICD-10-CM | POA: Diagnosis not present

## 2016-03-17 DIAGNOSIS — I10 Essential (primary) hypertension: Secondary | ICD-10-CM | POA: Diagnosis not present

## 2016-03-17 DIAGNOSIS — E119 Type 2 diabetes mellitus without complications: Secondary | ICD-10-CM | POA: Diagnosis not present

## 2016-04-05 DIAGNOSIS — G43809 Other migraine, not intractable, without status migrainosus: Secondary | ICD-10-CM | POA: Diagnosis not present

## 2016-04-05 DIAGNOSIS — H04123 Dry eye syndrome of bilateral lacrimal glands: Secondary | ICD-10-CM | POA: Diagnosis not present

## 2016-04-05 DIAGNOSIS — H26493 Other secondary cataract, bilateral: Secondary | ICD-10-CM | POA: Diagnosis not present

## 2016-05-12 DIAGNOSIS — M170 Bilateral primary osteoarthritis of knee: Secondary | ICD-10-CM | POA: Diagnosis not present

## 2016-05-12 DIAGNOSIS — M1711 Unilateral primary osteoarthritis, right knee: Secondary | ICD-10-CM | POA: Diagnosis not present

## 2016-05-12 DIAGNOSIS — M1712 Unilateral primary osteoarthritis, left knee: Secondary | ICD-10-CM | POA: Diagnosis not present

## 2016-05-24 DIAGNOSIS — R311 Benign essential microscopic hematuria: Secondary | ICD-10-CM | POA: Diagnosis not present

## 2016-05-24 DIAGNOSIS — I10 Essential (primary) hypertension: Secondary | ICD-10-CM | POA: Diagnosis not present

## 2016-05-24 DIAGNOSIS — M150 Primary generalized (osteo)arthritis: Secondary | ICD-10-CM | POA: Diagnosis not present

## 2016-05-31 DIAGNOSIS — L814 Other melanin hyperpigmentation: Secondary | ICD-10-CM | POA: Diagnosis not present

## 2016-05-31 DIAGNOSIS — D492 Neoplasm of unspecified behavior of bone, soft tissue, and skin: Secondary | ICD-10-CM | POA: Diagnosis not present

## 2016-05-31 DIAGNOSIS — C44629 Squamous cell carcinoma of skin of left upper limb, including shoulder: Secondary | ICD-10-CM | POA: Diagnosis not present

## 2016-05-31 DIAGNOSIS — D229 Melanocytic nevi, unspecified: Secondary | ICD-10-CM | POA: Diagnosis not present

## 2016-05-31 DIAGNOSIS — Z85820 Personal history of malignant melanoma of skin: Secondary | ICD-10-CM | POA: Diagnosis not present

## 2016-06-03 DIAGNOSIS — I10 Essential (primary) hypertension: Secondary | ICD-10-CM | POA: Diagnosis not present

## 2016-06-03 DIAGNOSIS — M150 Primary generalized (osteo)arthritis: Secondary | ICD-10-CM | POA: Diagnosis not present

## 2016-06-03 DIAGNOSIS — R311 Benign essential microscopic hematuria: Secondary | ICD-10-CM | POA: Diagnosis not present

## 2016-06-03 DIAGNOSIS — Z0000 Encounter for general adult medical examination without abnormal findings: Secondary | ICD-10-CM | POA: Diagnosis not present

## 2016-06-09 ENCOUNTER — Ambulatory Visit: Payer: Self-pay | Admitting: Orthopedic Surgery

## 2016-06-09 DIAGNOSIS — Z01818 Encounter for other preprocedural examination: Secondary | ICD-10-CM | POA: Diagnosis not present

## 2016-06-09 DIAGNOSIS — R311 Benign essential microscopic hematuria: Secondary | ICD-10-CM | POA: Diagnosis not present

## 2016-06-09 DIAGNOSIS — M150 Primary generalized (osteo)arthritis: Secondary | ICD-10-CM | POA: Diagnosis not present

## 2016-06-09 DIAGNOSIS — I10 Essential (primary) hypertension: Secondary | ICD-10-CM | POA: Diagnosis not present

## 2016-06-14 DIAGNOSIS — M1712 Unilateral primary osteoarthritis, left knee: Secondary | ICD-10-CM | POA: Diagnosis not present

## 2016-06-27 NOTE — Patient Instructions (Signed)
John Austin  06/27/2016   Your procedure is scheduled on: 07/04/2016    Report to [facility]  Entrance take [facility]  elevators to 3rd floor to  [place] at     1150 AM.  Call this number if you have problems the morning of surgery [phone]   Remember: ONLY 1 PERSON MAY GO WITH YOU TO SHORT STAY TO GET  READY MORNING OF [place].  Do not eat food    CLEAR LIQUID DIET   Foods Allowed                                                                     Foods Excluded  Coffee and tea, regular and decaf                             liquids that you cannot  Plain Jell-O in any flavor                                             see through such as: Fruit ices (not with fruit pulp)                                     milk, soups, orange juice  Iced Popsicles                                    All solid food Carbonated beverages, regular and diet                                    Cranberry, grape and apple juices Sports drinks like Gatorade Lightly seasoned clear broth or consume(fat free) Sugar, honey syrup  Sample Menu Breakfast                                Lunch                                     Supper Cranberry juice                    Beef broth                            Chicken broth Jell-O                                     Grape juice  Apple juice Coffee or tea                        Jell-O                                      Popsicle                                                Coffee or tea                        Coffee or tea  _____________________________________________________________________   :After Midnight.               Clear liquid diet from 12 midnite until 0800am morning of surgery then nothing by mouth.       Take these medicines the morning of surgery with A SIP OF WATER: Allegra, Flonase                                 You may not have any metal on your body including hair pins  and              piercings  Do not wear jewelry, , lotions, powders or perfumes, deodorant                        Men may shave face and neck.   Do not bring valuables to the hospital. [place].  Contacts, dentures or bridgework may not be worn into surgery.  Leave suitcase in the car. After surgery it may be brought to your room.                       Please read over the following fact sheets you were given: _____________________________________________________________________             [facility] - Preparing for Surgery Before surgery, you can play an important role.  Because skin is not sterile, your skin needs to be as free of germs as possible.  You can reduce the number of germs on your skin by washing with CHG (chlorahexidine gluconate) soap before surgery.  CHG is an antiseptic cleaner which kills germs and bonds with the skin to continue killing germs even after washing. Please DO NOT use if you have an allergy to CHG or antibacterial soaps.  If your skin becomes reddened/irritated stop using the CHG and inform your nurse when you arrive at Short Stay. Do not shave (including legs and underarms) for at least 48 hours prior to the first CHG shower.  You may shave your face/neck. Please follow these instructions carefully:  1.  Shower with CHG Soap the night before surgery and the  morning of Surgery.  2.  If you choose to wash your hair, wash your hair first as usual with your  normal  shampoo.  3.  After you shampoo, rinse your hair and body thoroughly to remove the  shampoo.  4.  Use CHG as you would any other liquid soap.  You can apply chg directly  to the skin and wash                       Gently with a scrungie or clean washcloth.  5.  Apply the CHG Soap to your body ONLY FROM THE NECK DOWN.   Do not use on face/ open                           Wound or open sores. Avoid contact with eyes, ears  mouth and genitals (private parts).                       Wash face,  Genitals (private parts) with your normal soap.             6.  Wash thoroughly, paying special attention to the area where your surgery  will be performed.  7.  Thoroughly rinse your body with warm water from the neck down.  8.  DO NOT shower/wash with your normal soap after using and rinsing off  the CHG Soap.                9.  Pat yourself dry with a clean towel.            10.  Wear clean pajamas.            11.  Place clean sheets on your bed the night of your first shower and do not  sleep with pets. Day of Surgery : Do not apply any lotions/deodorants the morning of surgery.  Please wear clean clothes to the hospital/surgery center.  FAILURE TO FOLLOW THESE INSTRUCTIONS MAY RESULT IN THE CANCELLATION OF YOUR SURGERY PATIENT SIGNATURE_________________________________  NURSE SIGNATURE__________________________________  ________________________________________________________________________  WHAT IS A BLOOD TRANSFUSION? Blood Transfusion Information  A transfusion is the replacement of blood or some of its parts. Blood is made up of multiple cells which provide different functions.  Red blood cells carry oxygen and are used for blood loss replacement.  White blood cells fight against infection.  Platelets control bleeding.  Plasma helps clot blood.  Other blood products are available for specialized needs, such as hemophilia or other clotting disorders. BEFORE THE TRANSFUSION  Who gives blood for transfusions?   Healthy volunteers who are fully evaluated to make sure their blood is safe. This is blood bank blood. Transfusion therapy is the safest it has ever been in the practice of medicine. Before blood is taken from a donor, a complete history is taken to make sure that person has no history of diseases nor engages in risky social behavior (examples are intravenous drug use or sexual activity with  multiple partners). The donor's travel history is screened to minimize risk of transmitting infections, such as malaria. The donated blood is tested for signs of infectious diseases, such as HIV and hepatitis. The blood is then tested to be sure it is compatible with you in order to minimize the chance of a transfusion reaction. If you or a relative donates blood, this is often done in anticipation of surgery and is not appropriate for emergency situations. It takes many days to process the donated blood. RISKS AND COMPLICATIONS Although transfusion therapy is very safe and saves many lives, the main dangers of transfusion include:   Getting an infectious disease.  Developing a transfusion reaction.  This is an allergic reaction to something in the blood you were given. Every precaution is taken to prevent this. The decision to have a blood transfusion has been considered carefully by your caregiver before blood is given. Blood is not given unless the benefits outweigh the risks. AFTER THE TRANSFUSION  Right after receiving a blood transfusion, you will usually feel much better and more energetic. This is especially true if your red blood cells have gotten low (anemic). The transfusion raises the level of the red blood cells which carry oxygen, and this usually causes an energy increase.  The nurse administering the transfusion will monitor you carefully for complications. HOME CARE INSTRUCTIONS  No special instructions are needed after a transfusion. You may find your energy is better. Speak with your caregiver about any limitations on activity for underlying diseases you may have. SEEK MEDICAL CARE IF:   Your condition is not improving after your transfusion.  You develop redness or irritation at the intravenous (IV) site. SEEK IMMEDIATE MEDICAL CARE IF:  Any of the following symptoms occur over the next 12 hours:  Shaking chills.  You have a temperature by mouth above 102 F (38.9 C), not  controlled by medicine.  Chest, back, or muscle pain.  People around you feel you are not acting correctly or are confused.  Shortness of breath or difficulty breathing.  Dizziness and fainting.  You get a rash or develop hives.  You have a decrease in urine output.  Your urine turns a dark color or changes to pink, red, or brown. Any of the following symptoms occur over the next 10 days:  You have a temperature by mouth above 102 F (38.9 C), not controlled by medicine.  Shortness of breath.  Weakness after normal activity.  The white part of the eye turns yellow (jaundice).  You have a decrease in the amount of urine or are urinating less often.  Your urine turns a dark color or changes to pink, red, or brown. Document Released: 04/22/2000 Document Revised: 07/18/2011 Document Reviewed: 12/10/2007 ExitCare Patient Information 2014 [place].  _______________________________________________________________________  Incentive Spirometer  An incentive spirometer is a tool that can help keep your lungs clear and active. This tool measures how well you are filling your lungs with each breath. Taking long deep breaths may help reverse or decrease the chance of developing breathing (pulmonary) problems (especially infection) following:  A long period of time when you are unable to move or be active. BEFORE THE PROCEDURE   If the spirometer includes an indicator to show your best effort, your nurse or respiratory therapist will set it to a desired goal.  If possible, sit up straight or lean slightly forward. Try not to slouch.  Hold the incentive spirometer in an upright position. INSTRUCTIONS FOR USE  1. Sit on the edge of your bed if possible, or sit up as far as you can in bed or on a chair. 2. Hold the incentive spirometer in an upright position. 3. Breathe out normally. 4. Place the mouthpiece in your mouth and seal your lips tightly around it. 5. Breathe in  slowly and as deeply as possible, raising the piston or the ball toward the top of the column. 6. Hold your breath for 3-5 seconds or for as long as possible. Allow the piston or ball to fall to the bottom of the column. 7. Remove the mouthpiece from your mouth and breathe out normally. 8. Rest for a few seconds and repeat Steps  1 through 7 at least 10 times every 1-2 hours when you are awake. Take your time and take a few normal breaths between deep breaths. 9. The spirometer may include an indicator to show your best effort. Use the indicator as a goal to work toward during each repetition. 10. After each set of 10 deep breaths, practice coughing to be sure your lungs are clear. If you have an incision (the cut made at the time of surgery), support your incision when coughing by placing a pillow or rolled up towels firmly against it. Once you are able to get out of bed, walk around indoors and cough well. You may stop using the incentive spirometer when instructed by your caregiver.  RISKS AND COMPLICATIONS  Take your time so you do not get dizzy or light-headed.  If you are in pain, you may need to take or ask for pain medication before doing incentive spirometry. It is harder to take a deep breath if you are having pain. AFTER USE  Rest and breathe slowly and easily.  It can be helpful to keep track of a log of your progress. Your caregiver can provide you with a simple table to help with this. If you are using the spirometer at home, follow these instructions: [place] IF:   You are having difficultly using the spirometer.  You have trouble using the spirometer as often as instructed.  Your pain medication is not giving enough relief while using the spirometer.  You develop fever of 100.5 F (38.1 C) or higher. SEEK IMMEDIATE MEDICAL CARE IF:   You cough up bloody sputum that had not been present before.  You develop fever of 102 F (38.9 C) or greater.  You develop  worsening pain at or near the incision site. MAKE SURE YOU:   Understand these instructions.  Will watch your condition.  Will get help right away if you are not doing well or get worse. Document Released: 09/05/2006 Document Revised: 07/18/2011 Document Reviewed: 11/06/2006 [facility] Patient Information 2014 [place], [place].   ________________________________________________________________________

## 2016-06-28 ENCOUNTER — Encounter (HOSPITAL_COMMUNITY)
Admission: RE | Admit: 2016-06-28 | Discharge: 2016-06-28 | Disposition: A | Discharge status: Home / Self Care | Payer: Medicare Other | Source: Ambulatory Visit | Attending: Orthopedic Surgery | Admitting: Orthopedic Surgery

## 2016-06-28 ENCOUNTER — Encounter (INDEPENDENT_AMBULATORY_CARE_PROVIDER_SITE_OTHER): Payer: Self-pay

## 2016-06-28 ENCOUNTER — Encounter (HOSPITAL_COMMUNITY): Payer: Self-pay

## 2016-06-28 DIAGNOSIS — M1712 Unilateral primary osteoarthritis, left knee: Secondary | ICD-10-CM | POA: Insufficient documentation

## 2016-06-28 DIAGNOSIS — Z01818 Encounter for other preprocedural examination: Secondary | ICD-10-CM | POA: Insufficient documentation

## 2016-06-28 HISTORY — DX: Gastro-esophageal reflux disease without esophagitis: K21.9

## 2016-06-28 HISTORY — DX: Personal history of other diseases of the digestive system: Z87.19

## 2016-06-28 HISTORY — DX: Malignant (primary) neoplasm, unspecified: C80.1

## 2016-06-28 LAB — SURGICAL PCR SCREEN
MRSA, PCR: NEGATIVE
Staphylococcus aureus: NEGATIVE

## 2016-06-28 LAB — CBC
HCT: 41 % (ref 39.0–52.0)
Hemoglobin: 13.8 g/dL (ref 13.0–17.0)
MCH: 27.2 pg (ref 26.0–34.0)
MCHC: 33.7 g/dL (ref 30.0–36.0)
MCV: 80.7 fL (ref 78.0–100.0)
PLATELETS: 272 10*3/uL (ref 150–400)
RBC: 5.08 MIL/uL (ref 4.22–5.81)
RDW: 14.5 % (ref 11.5–15.5)
WBC: 8.3 10*3/uL (ref 4.0–10.5)

## 2016-06-28 LAB — COMPREHENSIVE METABOLIC PANEL
ALK PHOS: 56 U/L (ref 38–126)
ALT: 18 U/L (ref 17–63)
ANION GAP: 7 (ref 5–15)
AST: 22 U/L (ref 15–41)
Albumin: 4.1 g/dL (ref 3.5–5.0)
BUN: 10 mg/dL (ref 6–20)
CALCIUM: 9.5 mg/dL (ref 8.9–10.3)
CO2: 31 mmol/L (ref 22–32)
CREATININE: 0.9 mg/dL (ref 0.61–1.24)
Chloride: 102 mmol/L (ref 101–111)
Glucose, Bld: 108 mg/dL — ABNORMAL HIGH (ref 65–99)
Potassium: 3.5 mmol/L (ref 3.5–5.1)
Sodium: 140 mmol/L (ref 135–145)
Total Bilirubin: 0.9 mg/dL (ref 0.3–1.2)
Total Protein: 7 g/dL (ref 6.5–8.1)

## 2016-06-28 LAB — ABO/RH: ABO/RH(D): A POS

## 2016-06-28 LAB — APTT: aPTT: 30 seconds (ref 24–36)

## 2016-06-28 LAB — PROTIME-INR
INR: 1.06
PROTHROMBIN TIME: 13.8 s (ref 11.4–15.2)

## 2016-06-28 NOTE — Progress Notes (Signed)
06/09/2016-LOV- dr Ashby Dawes on chart EKG-06/09/16- on chart  08/21/15-EKG-epic and 2V CXR- epic

## 2016-07-01 NOTE — Progress Notes (Signed)
Patient called and informed of time change for surgery. Patient instructed to arrive at Short Stay at [facility] on Monday 07/04/2016 at 0730 am and nothing to eat or drink after midnight and take medications as instructed by nurse at Pre-op appointment! Patient verbalized understanding!

## 2016-07-03 ENCOUNTER — Emergency Department (HOSPITAL_COMMUNITY): Payer: Medicare Other

## 2016-07-03 ENCOUNTER — Emergency Department (HOSPITAL_COMMUNITY)
Admission: EM | Admit: 2016-07-03 | Discharge: 2016-07-03 | Disposition: A | Discharge status: Home / Self Care | Payer: Medicare Other | Attending: Emergency Medicine | Admitting: Emergency Medicine

## 2016-07-03 ENCOUNTER — Encounter (HOSPITAL_COMMUNITY): Payer: Self-pay

## 2016-07-03 DIAGNOSIS — Z7982 Long term (current) use of aspirin: Secondary | ICD-10-CM | POA: Diagnosis not present

## 2016-07-03 DIAGNOSIS — I10 Essential (primary) hypertension: Secondary | ICD-10-CM | POA: Diagnosis not present

## 2016-07-03 DIAGNOSIS — Z85828 Personal history of other malignant neoplasm of skin: Secondary | ICD-10-CM | POA: Diagnosis not present

## 2016-07-03 DIAGNOSIS — R1032 Left lower quadrant pain: Secondary | ICD-10-CM | POA: Diagnosis not present

## 2016-07-03 DIAGNOSIS — K5732 Diverticulitis of large intestine without perforation or abscess without bleeding: Secondary | ICD-10-CM | POA: Diagnosis not present

## 2016-07-03 LAB — URINALYSIS, ROUTINE W REFLEX MICROSCOPIC
BILIRUBIN URINE: NEGATIVE
Bacteria, UA: NONE SEEN
Glucose, UA: NEGATIVE mg/dL
Ketones, ur: NEGATIVE mg/dL
Leukocytes, UA: NEGATIVE
NITRITE: NEGATIVE
PH: 8 (ref 5.0–8.0)
Protein, ur: NEGATIVE mg/dL
Specific Gravity, Urine: 1.002 — ABNORMAL LOW (ref 1.005–1.030)
Squamous Epithelial / LPF: NONE SEEN

## 2016-07-03 LAB — COMPREHENSIVE METABOLIC PANEL
ALBUMIN: 3.8 g/dL (ref 3.5–5.0)
ALK PHOS: 58 U/L (ref 38–126)
ALT: 16 U/L — ABNORMAL LOW (ref 17–63)
ANION GAP: 7 (ref 5–15)
AST: 19 U/L (ref 15–41)
BILIRUBIN TOTAL: 0.9 mg/dL (ref 0.3–1.2)
BUN: 7 mg/dL (ref 6–20)
CALCIUM: 9 mg/dL (ref 8.9–10.3)
CO2: 30 mmol/L (ref 22–32)
Chloride: 104 mmol/L (ref 101–111)
Creatinine, Ser: 0.73 mg/dL (ref 0.61–1.24)
GLUCOSE: 93 mg/dL (ref 65–99)
Potassium: 3.8 mmol/L (ref 3.5–5.1)
Sodium: 141 mmol/L (ref 135–145)
TOTAL PROTEIN: 6.7 g/dL (ref 6.5–8.1)

## 2016-07-03 LAB — CBC
HCT: 40.1 % (ref 39.0–52.0)
HEMOGLOBIN: 13.7 g/dL (ref 13.0–17.0)
MCH: 27.4 pg (ref 26.0–34.0)
MCHC: 34.2 g/dL (ref 30.0–36.0)
MCV: 80.2 fL (ref 78.0–100.0)
Platelets: 252 10*3/uL (ref 150–400)
RBC: 5 MIL/uL (ref 4.22–5.81)
RDW: 14.6 % (ref 11.5–15.5)
WBC: 11.7 10*3/uL — ABNORMAL HIGH (ref 4.0–10.5)

## 2016-07-03 LAB — LIPASE, BLOOD: Lipase: 20 U/L (ref 11–51)

## 2016-07-03 MED ORDER — AMOXICILLIN-POT CLAVULANATE 875-125 MG PO TABS: 1.0000 | ORAL_TABLET | Freq: Three times a day (TID) | ORAL | 0 refills | Status: AC

## 2016-07-03 MED ORDER — IOPAMIDOL (ISOVUE-300) INJECTION 61%
INTRAVENOUS | Status: AC
  Administered 2016-07-03: 100 mL
  Filled 2016-07-03: qty 100

## 2016-07-03 NOTE — ED Notes (Signed)
Pt ambulatory and independent at discharge.  Verbalized understanding of discharge instructions 

## 2016-07-03 NOTE — ED Provider Notes (Signed)
[facility] DEPT Provider Note   CSN: [id number] Arrival date & time: 07/03/16  1332     History   Chief Complaint Chief Complaint  Patient presents with  . Abdominal Pain    HPI John Austin is a 76 y.o. male.  HPI  Presents with concern of left lower quadrant pain beginning yesterday. Reports the pain is mild at baseline, however severe with palpation. Reports its in this same location and quality as pain he's had with prior episodes of diverticulitis. Denies any nausea, vomiting. Reports he stopped taking Colace and some of his other regular medications as he's having knee surgery tomorrow. He's had harder stools than normal, however no other significant change. Denies fevers, urinary symptoms.  Past Medical History:  Diagnosis Date  . Arthritis   . Cancer (HCC)    hx of skin cancer   . Essential hypertension   . GERD (gastroesophageal reflux disease)   . History of hiatal hernia     Patient Active Problem List   Diagnosis Date Noted  . Leukocytosis 04/29/2015  . Abdominal pain, left lower quadrant 04/29/2015  . Acute diverticulitis 04/27/2015  . Essential hypertension 04/27/2015    Past Surgical History:  Procedure Laterality Date  . BILATERAL SHOULDER SURGERY    . COLONOSCOPY    . eyebrow surgery     . RIGHT FOOT SURGERY         Home Medications    Prior to Admission medications   Medication Sig Start Date End Date Taking? Authorizing Provider  aspirin EC 81 MG tablet Take 81 mg by mouth at bedtime.   Yes Historical Provider, MD  b complex vitamins tablet Take 1 tablet by mouth daily.   Yes Historical Provider, MD  celecoxib (CELEBREX) 200 MG capsule Take 200 mg by mouth daily as needed (foot pain). Foot pain. 03/31/15  Yes Historical Provider, MD  Cinnamon 500 MG TABS Take 1,000 mg by mouth 2 (two) times daily.   Yes Historical Provider, MD  diltiazem (CARDIZEM CD) 120 MG 24 hr capsule Take 120 mg by mouth at bedtime.  04/25/15  Yes Historical  Provider, MD  docusate sodium (COLACE) 100 MG capsule Take 200 mg by mouth at bedtime.   Yes Historical Provider, MD  doxycycline (ADOXA) 50 MG tablet Take 50 mg by mouth 2 (two) times daily as needed (Rosacea).   Yes Historical Provider, MD  fexofenadine (ALLEGRA) 180 MG tablet Take 180 mg by mouth daily.    Yes Historical Provider, MD  fluticasone (FLONASE) 50 MCG/ACT nasal spray Place 2 sprays into both nostrils daily as needed for allergies. Allergies. 03/30/15  Yes Historical Provider, MD  meclizine (ANTIVERT) 25 MG tablet Take 12.5-25 mg by mouth daily as needed. dizziness 02/02/15  Yes Historical Provider, MD  metroNIDAZOLE (METROCREAM) 0.75 % cream Apply 1 application topically daily as needed.   Yes Historical Provider, MD  Multiple Vitamins-Minerals (MULTIVITAMIN WITH MINERALS) tablet Take 1 tablet by mouth daily.   Yes Historical Provider, MD  Omega-3 Fatty Acids (FISH OIL) 1000 MG CAPS Take 1 capsule by mouth 2 (two) times daily.   Yes Historical Provider, MD  triamcinolone cream (KENALOG) 0.1 % Apply 1 application topically daily as needed.   Yes Historical Provider, MD  Turmeric 500 MG CAPS Take 2 capsules by mouth daily.   Yes Historical Provider, MD  Vitamin D, Ergocalciferol, (DRISDOL) 50000 units CAPS capsule Take 50,000 Units by mouth every 30 (thirty) days.   Yes Historical Provider, MD  acetaminophen (TYLENOL)  500 MG tablet Take 500 mg by mouth every 6 (six) hours as needed for mild pain or headache.    Historical Provider, MD  amoxicillin-clavulanate (AUGMENTIN) 875-125 MG tablet Take 1 tablet by mouth 3 (three) times daily. 07/03/16 07/13/16  Gareth Morgan, MD    Family History History reviewed. No pertinent family history.  Social History Social History  Substance Use Topics  . Smoking status: Never Smoker  . Smokeless tobacco: Current User    Types: Chew  . Alcohol use Yes     Comment: 4 ounces wine daily      Allergies   Ciprofloxacin and Flagyl  [metronidazole]   Review of Systems Review of Systems  Constitutional: Negative for fever.  HENT: Negative for sore throat.   Eyes: Negative for visual disturbance.  Respiratory: Negative for shortness of breath.   Cardiovascular: Negative for chest pain.  Gastrointestinal: Positive for abdominal pain. Negative for constipation, diarrhea, nausea and vomiting.  Genitourinary: Negative for difficulty urinating.  Musculoskeletal: Negative for back pain and neck stiffness.  Skin: Negative for rash.  Neurological: Negative for syncope and headaches.     Physical Exam Updated Vital Signs BP 134/73 (BP Location: Left Arm)   Pulse 62   Temp 98.5 F (36.9 C) (Oral)   Resp 16   Ht 5' 8.5" (1.74 m)   Wt 193 lb 5 oz (87.7 kg)   SpO2 97%   BMI 28.97 kg/m   Physical Exam  Constitutional: He is oriented to person, place, and time. He appears well-developed and well-nourished. No distress.  HENT:  Head: Normocephalic and atraumatic.  Eyes: Conjunctivae and EOM are normal.  Neck: Normal range of motion.  Cardiovascular: Normal rate, regular rhythm, normal heart sounds and intact distal pulses.  Exam reveals no gallop and no friction rub.   No murmur heard. Pulmonary/Chest: Effort normal and breath sounds normal. No respiratory distress. He has no wheezes. He has no rales.  Abdominal: Soft. He exhibits no distension. There is tenderness (LLQ, left middle abdomen). There is guarding.  Musculoskeletal: He exhibits no edema.  Neurological: He is alert and oriented to person, place, and time.  Skin: Skin is warm and dry. He is not diaphoretic.  Nursing note and vitals reviewed.    ED Treatments / Results  Labs (all labs ordered are listed, but only abnormal results are displayed) Labs Reviewed  COMPREHENSIVE METABOLIC PANEL - Abnormal; Notable for the following:       Result Value   ALT 16 (*)    All other components within normal limits  CBC - Abnormal; Notable for the following:     WBC 11.7 (*)    All other components within normal limits  URINALYSIS, ROUTINE W REFLEX MICROSCOPIC - Abnormal; Notable for the following:    Color, Urine STRAW (*)    Specific Gravity, Urine 1.002 (*)    Hgb urine dipstick MODERATE (*)    All other components within normal limits  LIPASE, BLOOD    EKG  EKG Interpretation None       Radiology Ct Abdomen Pelvis W Contrast  Result Date: 07/03/2016 CLINICAL DATA:  Left lower quadrant pain with history of recurrent diverticulitis. EXAM: CT ABDOMEN AND PELVIS WITH CONTRAST TECHNIQUE: Multidetector CT imaging of the abdomen and pelvis was performed using the standard protocol following bolus administration of intravenous contrast. CONTRAST:  136mL ISOVUE-300 IOPAMIDOL (ISOVUE-300) INJECTION 61% COMPARISON:  CT, 08/18/2015 FINDINGS: Lower chest: No acute abnormality. Hepatobiliary: Unremarkable liver. Small dependent gallstones. No gallbladder wall  thickening or adjacent inflammation. No bile duct dilation. Pancreas: Unremarkable. No pancreatic ductal dilatation or surrounding inflammatory changes. Spleen: Normal in size without focal abnormality. Adrenals/Urinary Tract: No adrenal masses. 3.8 cm lower pole left renal cyst. No other renal masses, no stones and no hydronephrosis. Normal ureters. Normal bladder. Stomach/Bowel: Hazy inflammatory changes seen adjacent to proximal sigmoid colon in the left lower quadrant. There are associated diverticula. There is no extraluminal air or formed fluid collection to suggest an abscess. Numerous other noninflamed diverticula are noted throughout the colon. No other colonic inflammation. Small to moderate hiatal hernia. Stomach otherwise unremarkable. Normal small bowel. Normal appendix visualized. Vascular/Lymphatic: Aortic atherosclerosis. No enlarged abdominal or pelvic lymph nodes. Reproductive: Unremarkable. Other: No abdominal wall hernia.  No ascites. Musculoskeletal: No fracture or acute finding. No  osteoblastic or osteolytic lesions. IMPRESSION: 1. Acute uncomplicated diverticulitis of the proximal sigmoid colon. No evidence of an abscess. No extraluminal or free air. 2. No other acute abnormalities. 3. Extensive colonic diverticulosis. 4. Gallstones, left renal cyst and small to moderate size hiatal hernia. Electronically Signed   By: Lajean Manes M.D.   On: 07/03/2016 17:33    Procedures Procedures (including critical care time)  Medications Ordered in ED Medications  iopamidol (ISOVUE-300) 61 % injection (100 mLs  Contrast Given 07/03/16 1704)     Initial Impression / Assessment and Plan / ED Course  I have reviewed the triage vital signs and the nursing notes.  Pertinent labs & imaging results that were available during my care of the patient were reviewed by me and considered in my medical decision making (see chart for details).     76 year old male with a history of hypertension and diverticulitis presents with concern for left lower quadrant abdominal pain. Patient is scheduled to have knee surgery tomorrow. CT abdomen and pelvis was ordered which showed acute uncomplicated diverticulitis.  Patient with allergies to cipro/flagyl.  Given TID augmentin for 10 days. Instructed to call Orthopedic surgeon regarding diagnosis, possible delay of surgery. Pt declines nausea and pain medications. Patient discharged in stable condition with understanding of reasons to return.   Final Clinical Impressions(s) / ED Diagnoses   Final diagnoses:  Diverticulitis of large intestine without perforation or abscess without bleeding    New Prescriptions New Prescriptions   AMOXICILLIN-CLAVULANATE (AUGMENTIN) 875-125 MG TABLET    Take 1 tablet by mouth 3 (three) times daily.     Gareth Morgan, MD 07/03/16 [phone]

## 2016-07-03 NOTE — ED Triage Notes (Signed)
Pt has hx of diverticulitis.  Pt having knee surgery in morning.  Pt started having left lower abdominal pain yesterday.  Same area of diverticulitis.  No n/v/d

## 2016-07-04 ENCOUNTER — Encounter (HOSPITAL_COMMUNITY): Admission: RE | Payer: Self-pay | Source: Ambulatory Visit

## 2016-07-04 ENCOUNTER — Inpatient Hospital Stay (HOSPITAL_COMMUNITY): Admission: RE | Admit: 2016-07-04 | Payer: Medicare Other | Source: Ambulatory Visit | Admitting: Orthopedic Surgery

## 2016-07-04 LAB — TYPE AND SCREEN
ABO/RH(D): A POS
Antibody Screen: NEGATIVE

## 2016-07-04 SURGERY — ARTHROPLASTY, KNEE, TOTAL
Anesthesia: Choice | Site: Knee | Laterality: Left

## 2016-07-08 ENCOUNTER — Ambulatory Visit: Payer: PRIVATE HEALTH INSURANCE | Admitting: Physical Therapy

## 2016-07-14 DIAGNOSIS — R109 Unspecified abdominal pain: Secondary | ICD-10-CM | POA: Diagnosis not present

## 2016-07-14 DIAGNOSIS — K5792 Diverticulitis of intestine, part unspecified, without perforation or abscess without bleeding: Secondary | ICD-10-CM | POA: Diagnosis not present

## 2016-07-14 DIAGNOSIS — D72829 Elevated white blood cell count, unspecified: Secondary | ICD-10-CM | POA: Diagnosis not present

## 2016-07-27 DIAGNOSIS — K5730 Diverticulosis of large intestine without perforation or abscess without bleeding: Secondary | ICD-10-CM | POA: Diagnosis not present

## 2016-08-01 ENCOUNTER — Ambulatory Visit: Payer: Self-pay | Admitting: Orthopedic Surgery

## 2016-08-02 ENCOUNTER — Ambulatory Visit: Payer: Self-pay | Admitting: Orthopedic Surgery

## 2016-08-02 ENCOUNTER — Other Ambulatory Visit (HOSPITAL_COMMUNITY): Payer: Self-pay | Admitting: Emergency Medicine

## 2016-08-02 ENCOUNTER — Other Ambulatory Visit (HOSPITAL_COMMUNITY): Payer: Medicare Other

## 2016-08-02 NOTE — Patient Instructions (Signed)
Lane Eland Corle  08/02/2016   Your procedure is scheduled on: 08-08-16  Report to [facility] Main  Entrance take [facility]  elevators to 3rd floor to  [place] at [phone].  Call this number if you have problems the morning of surgery [phone]   Remember: ONLY 1 PERSON MAY GO WITH YOU TO SHORT STAY TO GET  READY MORNING OF [place].  Do not eat food or drink liquids :After Midnight.     Take these medicines the morning of surgery with A SIP OF WATER: allegra as needed, tylenol as needed                                You may not have any metal on your body including hair pins and              piercings  Do not wear jewelry, make-up, lotions, powders or perfumes, deodorant             Do not wear nail polish.  Do not shave  48 hours prior to surgery.              Men may shave face and neck.   Do not bring valuables to the hospital. [place].  Contacts, dentures or bridgework may not be worn into surgery.  Leave suitcase in the car. After surgery it may be brought to your room.               Please read over the following fact sheets you were given: _____________________________________________________________________             [facility] - Preparing for Surgery Before surgery, you can play an important role.  Because skin is not sterile, your skin needs to be as free of germs as possible.  You can reduce the number of germs on your skin by washing with CHG (chlorahexidine gluconate) soap before surgery.  CHG is an antiseptic cleaner which kills germs and bonds with the skin to continue killing germs even after washing. Please DO NOT use if you have an allergy to CHG or antibacterial soaps.  If your skin becomes reddened/irritated stop using the CHG and inform your nurse when you arrive at Short Stay. Do not shave (including legs and underarms) for at least 48 hours prior to the first CHG shower.   You may shave your face/neck. Please follow these instructions carefully:  1.  Shower with CHG Soap the night before surgery and the  morning of Surgery.  2.  If you choose to wash your hair, wash your hair first as usual with your  normal  shampoo.  3.  After you shampoo, rinse your hair and body thoroughly to remove the  shampoo.                           4.  Use CHG as you would any other liquid soap.  You can apply chg directly  to the skin and wash                       Gently with a scrungie or clean washcloth.  5.  Apply the CHG  Soap to your body ONLY FROM THE NECK DOWN.   Do not use on face/ open                           Wound or open sores. Avoid contact with eyes, ears mouth and genitals (private parts).                       Wash face,  Genitals (private parts) with your normal soap.             6.  Wash thoroughly, paying special attention to the area where your surgery  will be performed.  7.  Thoroughly rinse your body with warm water from the neck down.  8.  DO NOT shower/wash with your normal soap after using and rinsing off  the CHG Soap.                9.  Pat yourself dry with a clean towel.            10.  Wear clean pajamas.            11.  Place clean sheets on your bed the night of your first shower and do not  sleep with pets. Day of Surgery : Do not apply any lotions/deodorants the morning of surgery.  Please wear clean clothes to the hospital/surgery center.  FAILURE TO FOLLOW THESE INSTRUCTIONS MAY RESULT IN THE CANCELLATION OF YOUR SURGERY PATIENT SIGNATURE_________________________________  NURSE SIGNATURE__________________________________  ________________________________________________________________________   [name]  An incentive spirometer is a tool that can help keep your lungs clear and active. This tool measures how well you are filling your lungs with each breath. Taking long deep breaths may help reverse or decrease the chance of  developing breathing (pulmonary) problems (especially infection) following:  A long period of time when you are unable to move or be active. BEFORE THE PROCEDURE   If the spirometer includes an indicator to show your best effort, your nurse or respiratory therapist will set it to a desired goal.  If possible, sit up straight or lean slightly forward. Try not to slouch.  Hold the incentive spirometer in an upright position. INSTRUCTIONS FOR USE  1. Sit on the edge of your bed if possible, or sit up as far as you can in bed or on a chair. 2. Hold the incentive spirometer in an upright position. 3. Breathe out normally. 4. Place the mouthpiece in your mouth and seal your lips tightly around it. 5. Breathe in slowly and as deeply as possible, raising the piston or the ball toward the top of the column. 6. Hold your breath for 3-5 seconds or for as long as possible. Allow the piston or ball to fall to the bottom of the column. 7. Remove the mouthpiece from your mouth and breathe out normally. 8. Rest for a few seconds and repeat Steps 1 through 7 at least 10 times every 1-2 hours when you are awake. Take your time and take a few normal breaths between deep breaths. 9. The spirometer may include an indicator to show your best effort. Use the indicator as a goal to work toward during each repetition. 10. After each set of 10 deep breaths, practice coughing to be sure your lungs are clear. If you have an incision (the cut made at the time of surgery), support your incision when coughing by placing a pillow or rolled up towels  firmly against it. Once you are able to get out of bed, walk around indoors and cough well. You may stop using the incentive spirometer when instructed by your caregiver.  RISKS AND COMPLICATIONS  Take your time so you do not get dizzy or light-headed.  If you are in pain, you may need to take or ask for pain medication before doing incentive spirometry. It is harder to take a  deep breath if you are having pain. AFTER USE  Rest and breathe slowly and easily.  It can be helpful to keep track of a log of your progress. Your caregiver can provide you with a simple table to help with this. If you are using the spirometer at home, follow these instructions: Many IF:   You are having difficultly using the spirometer.  You have trouble using the spirometer as often as instructed.  Your pain medication is not giving enough relief while using the spirometer.  You develop fever of 100.5 F (38.1 C) or higher. SEEK IMMEDIATE MEDICAL CARE IF:   You cough up bloody sputum that had not been present before.  You develop fever of 102 F (38.9 C) or greater.  You develop worsening pain at or near the incision site. MAKE SURE YOU:   Understand these instructions.  Will watch your condition.  Will get help right away if you are not doing well or get worse. Document Released: 09/05/2006 Document Revised: 07/18/2011 Document Reviewed: 11/06/2006 ExitCare Patient Information 2014 ExitCare, [place].   ________________________________________________________________________  WHAT IS A BLOOD TRANSFUSION? Blood Transfusion Information  A transfusion is the replacement of blood or some of its parts. Blood is made up of multiple cells which provide different functions.  Red blood cells carry oxygen and are used for blood loss replacement.  White blood cells fight against infection.  Platelets control bleeding.  Plasma helps clot blood.  Other blood products are available for specialized needs, such as hemophilia or other clotting disorders. BEFORE THE TRANSFUSION  Who gives blood for transfusions?   Healthy volunteers who are fully evaluated to make sure their blood is safe. This is blood bank blood. Transfusion therapy is the safest it has ever been in the practice of medicine. Before blood is taken from a donor, a complete history is taken to make  sure that person has no history of diseases nor engages in risky social behavior (examples are intravenous drug use or sexual activity with multiple partners). The donor's travel history is screened to minimize risk of transmitting infections, such as malaria. The donated blood is tested for signs of infectious diseases, such as HIV and hepatitis. The blood is then tested to be sure it is compatible with you in order to minimize the chance of a transfusion reaction. If you or a relative donates blood, this is often done in anticipation of surgery and is not appropriate for emergency situations. It takes many days to process the donated blood. RISKS AND COMPLICATIONS Although transfusion therapy is very safe and saves many lives, the main dangers of transfusion include:   Getting an infectious disease.  Developing a transfusion reaction. This is an allergic reaction to something in the blood you were given. Every precaution is taken to prevent this. The decision to have a blood transfusion has been considered carefully by your caregiver before blood is given. Blood is not given unless the benefits outweigh the risks. AFTER THE TRANSFUSION  Right after receiving a blood transfusion, you will usually feel much better  and more energetic. This is especially true if your red blood cells have gotten low (anemic). The transfusion raises the level of the red blood cells which carry oxygen, and this usually causes an energy increase.  The nurse administering the transfusion will monitor you carefully for complications. HOME CARE INSTRUCTIONS  No special instructions are needed after a transfusion. You may find your energy is better. Speak with your caregiver about any limitations on activity for underlying diseases you may have. SEEK MEDICAL CARE IF:   Your condition is not improving after your transfusion.  You develop redness or irritation at the intravenous (IV) site. SEEK IMMEDIATE MEDICAL CARE IF:   Any of the following symptoms occur over the next 12 hours:  Shaking chills.  You have a temperature by mouth above 102 F (38.9 C), not controlled by medicine.  Chest, back, or muscle pain.  People around you feel you are not acting correctly or are confused.  Shortness of breath or difficulty breathing.  Dizziness and fainting.  You get a rash or develop hives.  You have a decrease in urine output.  Your urine turns a dark color or changes to pink, red, or brown. Any of the following symptoms occur over the next 10 days:  You have a temperature by mouth above 102 F (38.9 C), not controlled by medicine.  Shortness of breath.  Weakness after normal activity.  The white part of the eye turns yellow (jaundice).  You have a decrease in the amount of urine or are urinating less often.  Your urine turns a dark color or changes to pink, red, or brown. Document Released: 04/22/2000 Document Revised: 07/18/2011 Document Reviewed: 12/10/2007 [facility] Patient Information 2014 [place], [place].  _______________________________________________________________________

## 2016-08-02 NOTE — H&P (Signed)
John Austin DOB: 04/15/1941 Married / Language: English / Race: White Male Date of Admission:  08/08/2016 CC:  Left Knee Pain History of Present Illness  The patient is a 76 year old male who comes in for a preoperative History and Physical. The patient is scheduled for a left total knee arthroplasty to be performed by Dr. Dione Plover. Aluisio, MD at [facility] on 08-08-2016. The patient is a 76 year old male who presented for follow up of their knee. The patient is being followed for their bilateral knee pain (left worse than right) and osteoarthritis. Symptoms reported include: pain (lateral side pain), aching (left baker's cyst), popping, giving way and pain with weightbearing. The patient feels that they are doing poorly and report their pain level to be moderate. The following medication has been used for pain control: antiinflammatory medication (Celebrex). The patient has reported temporary improvement of their symptoms with: Cortisone injections. Unfortunately, his left knee is getting progressively worse. He states it is limiting what he can and cannot do. He is extremely active and does a lot of physical work, but now is getting much more difficult to do so. He has had cortisone in the past without benefit. He is at a stage now where he wants a more permanent fix for this knee because he wants to try and maintain his activity level. He is ready to proceed with surgery. Please note that he was originally scheduled for [name] but has a flare up of his diverticular disease. He was treated and now has resolved. He is rescheduled and ready to proceed at this time. They have been treated conservatively in the past for the above stated problem and despite conservative measures, they continue to have progressive pain and severe functional limitations and dysfunction. They have failed non-operative management including home exercise, medications, and injections. It is felt that they would benefit  from undergoing total joint replacement. Risks and benefits of the procedure have been discussed with the patient and they elect to proceed with surgery. There are no active contraindications to surgery such as ongoing infection or rapidly progressive neurological disease.   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)  Diverticulitis Of Colon  Osteoarthritis  High blood pressure  Skin Cancer  Vertigo  Shingles  Hypertension  Hiatal Hernia  Diverticulosis    Allergies  Cipro *FLUOROQUINOLONES*  Flagyl *ANTI-INFECTIVE AGENTS - MISC.*  altered mental status  Family History Cerebrovascular Accident  grandfather mothers side Drug / Alcohol Addiction  father Hypertension  brother and grandfather mothers side Osteoarthritis  grandfather mothers side  Social History Alcohol use  current drinker; drinks beer; less than 5 per week Children  5 or more Current work status  retired Engineer, agricultural (Currently)  no Drug/Alcohol Rehab (Previously)  no Exercise  Exercises weekly; does gym / weights Illicit drug use  no Living situation  live with spouse Marital status  married Pain Contract  no Tobacco / smoke exposure  no Tobacco use  former smoker; smoke(d) 3/4 pack(s) per day; uses 2 or more can(s) smokeless per week  Medication History Cartia XT (120MG  Capsule ER 24HR, Oral) Active. Flonase (50MCG/ACT Suspension, Nasal) Active. Vitamin D Active. MetroNIDAZOLE (0.75% Gel, External) Active. Triamcinolone Acetonide (0.1% Cream, External) Active. Fluticasone Propionate (50MCG/ACT Suspension, Nasal) Active. Aspirin (81MG  Tablet, Oral) Active. Meclizine HCl (25MG  Tablet, Oral) Active. Celecoxib (200MG  Capsule, Oral as needed) Active. Fish Oil (Oral) Specific strength unknown - Active. Multivitamin (Oral) Active. B Complex Vitamins (Oral) Active.  Past Surgical History Arthroscopy of Shoulder   bilateral Cataract Surgery  bilateral Foot Surgery  right Rotator Cuff Repair  left   Review of Systems  General Not Present- Chills, Fatigue, Fever, Memory Loss, Night Sweats, Weight Gain and Weight Loss. Skin Not Present- Eczema, Hives, Itching, Lesions and Rash. HEENT Not Present- Dentures, Double Vision, Headache, Hearing Loss, Tinnitus and Visual Loss. Respiratory Not Present- Allergies, Chronic Cough, Coughing up blood, Shortness of breath at rest and Shortness of breath with exertion. Cardiovascular Not Present- Chest Pain, Difficulty Breathing Lying Down, Murmur, Palpitations, Racing/skipping heartbeats and Swelling. Gastrointestinal Not Present- Abdominal Pain, Bloody Stool, Constipation, Diarrhea, Difficulty Swallowing, Heartburn, Jaundice, Loss of appetitie, Nausea and Vomiting. Male Genitourinary Present- Blood in Urine (Chronic for long time, has been worked up in the past) and [place] at [employer]. Not Present- Discharge, Flank Pain, Incontinence, Painful Urination, Urgency, Urinary frequency, Urinary Retention and Weak urinary stream. Musculoskeletal Present- Joint Pain. Not Present- Back Pain, Joint Swelling, Morning Stiffness, Muscle Pain, Muscle Weakness and Spasms. Neurological Not Present- Blackout spells, Difficulty with balance, Dizziness, Paralysis, Tremor and Weakness. Psychiatric Not Present- Insomnia.  Vitals Weight: 191 lb Height: 68in Weight was reported by patient. Height was reported by patient. Body Surface Area: 2 m Body Mass Index: 29.04 kg/m  Pulse: 76 (Regular)  BP: 142/76 (Sitting, Left Arm, Standard)  Physical Exam  General Mental Status -Alert, cooperative and good historian. General Appearance-pleasant, Not in acute distress. Orientation-Oriented X3. Build & Nutrition-Well nourished and Well developed.  Head and Neck Head-normocephalic, atraumatic . Neck Global Assessment - supple, no bruit auscultated on the right, no  bruit auscultated on the left.  Eye Vision-Wears corrective lenses. Pupil - Bilateral-Regular and Round. Motion - Bilateral-EOMI.  ENMT Note: upper and lower denture plates   Chest and Lung Exam Auscultation Breath sounds - clear at anterior chest wall and clear at posterior chest wall. Adventitious sounds - No Adventitious sounds.  Cardiovascular Auscultation Rhythm - Regular rate and rhythm. Heart Sounds - S1 WNL and S2 WNL. Murmurs & Other Heart Sounds - Auscultation of the heart reveals - No Murmurs.  Abdomen Palpation/Percussion Tenderness - Abdomen is non-tender to palpation. Rigidity (guarding) - Abdomen is soft. Auscultation Auscultation of the abdomen reveals - Bowel sounds normal.  Male Genitourinary Note: Not done, not pertinent to present illness   Musculoskeletal Note: On exam, he is in no distress. His left knee shows no effusion. He has varus deformity. His range is 5 to 125. He is tender medial greater than lateral, no instability. He is walking with a limp on that side. Right knee, no effusion. Range 5 to 130, slight crepitus on range of motion. No instability.  RADIOGRAPHS We reviewed his radiographs, AP and lateral of the knee. These were taken today and he has got bone-on-bone arthritis in the medial and patellofemoral compartment of the left knee. Due to the pain in the right knee, we obtained x-rays there and he also has arthritis in the right, but to a slightly lesser degree.  Assessment & Plan Primary osteoarthritis of both knees (M17.0)  Note:Surgical Plans: Left Total Knee Replacement  Disposition: Home - Straight to outpatient therapy at [facility] [place].  PCP: Dr. Ashby Dawes - Patient has been seen preoperatively and given a verbal clearance and felt to be stable for surgery.  IV TXA  Anesthesia Issues: None except hard to get to sleep and hard to wake up  Patient was instructed on what medications to stop prior to  surgery.  Signed electronically by Joelene Millin, III PA-C

## 2016-08-02 NOTE — Progress Notes (Addendum)
OV; Medical/cardiac clearance Dr Ashby Dawes 06-09-16 on chart EKG 06-09-16 chart CXR 08-18-15 epic Office visit F/U after diverticulitis to clear for surgery again  flare Dr Ashby Dawes 07-27-16 chart

## 2016-08-03 ENCOUNTER — Encounter (HOSPITAL_COMMUNITY): Payer: Self-pay

## 2016-08-03 ENCOUNTER — Encounter (HOSPITAL_COMMUNITY)
Admission: RE | Admit: 2016-08-03 | Discharge: 2016-08-03 | Disposition: A | Discharge status: Home / Self Care | Payer: Medicare Other | Source: Ambulatory Visit | Attending: Orthopedic Surgery | Admitting: Orthopedic Surgery

## 2016-08-03 DIAGNOSIS — Z01818 Encounter for other preprocedural examination: Secondary | ICD-10-CM | POA: Diagnosis not present

## 2016-08-03 DIAGNOSIS — M1712 Unilateral primary osteoarthritis, left knee: Secondary | ICD-10-CM | POA: Insufficient documentation

## 2016-08-03 HISTORY — DX: Other specified postprocedural states: Z98.890

## 2016-08-03 HISTORY — DX: Other specified postprocedural states: R11.2

## 2016-08-03 LAB — COMPREHENSIVE METABOLIC PANEL
ALK PHOS: 57 U/L (ref 38–126)
ALT: 24 U/L (ref 17–63)
ANION GAP: 7 (ref 5–15)
AST: 23 U/L (ref 15–41)
Albumin: 3.9 g/dL (ref 3.5–5.0)
BILIRUBIN TOTAL: 1 mg/dL (ref 0.3–1.2)
BUN: 8 mg/dL (ref 6–20)
CALCIUM: 9.2 mg/dL (ref 8.9–10.3)
CO2: 30 mmol/L (ref 22–32)
Chloride: 103 mmol/L (ref 101–111)
Creatinine, Ser: 0.81 mg/dL (ref 0.61–1.24)
GFR calc non Af Amer: >60 mL/min (ref >60)
GLUCOSE: 95 mg/dL (ref 65–99)
Potassium: 3.8 mmol/L (ref 3.5–5.1)
Sodium: 140 mmol/L (ref 135–145)
TOTAL PROTEIN: 7.1 g/dL (ref 6.5–8.1)

## 2016-08-03 LAB — PROTIME-INR
INR: 1.03
Prothrombin Time: 13.6 seconds (ref 11.4–15.2)

## 2016-08-03 LAB — CBC
HEMATOCRIT: 40.7 % (ref 39.0–52.0)
HEMOGLOBIN: 13.7 g/dL (ref 13.0–17.0)
MCH: 26.7 pg (ref 26.0–34.0)
MCHC: 33.7 g/dL (ref 30.0–36.0)
MCV: 79.2 fL (ref 78.0–100.0)
Platelets: 297 10*3/uL (ref 150–400)
RBC: 5.14 MIL/uL (ref 4.22–5.81)
RDW: 14.2 % (ref 11.5–15.5)
WBC: 9.9 10*3/uL (ref 4.0–10.5)

## 2016-08-03 LAB — SURGICAL PCR SCREEN
MRSA, PCR: NEGATIVE
Staphylococcus aureus: NEGATIVE

## 2016-08-03 LAB — APTT: aPTT: 29 seconds (ref 24–36)

## 2016-08-04 ENCOUNTER — Ambulatory Visit: Payer: Self-pay | Admitting: Orthopedic Surgery

## 2016-08-08 ENCOUNTER — Encounter (HOSPITAL_COMMUNITY): Payer: Self-pay | Admitting: *Deleted

## 2016-08-08 ENCOUNTER — Inpatient Hospital Stay (HOSPITAL_COMMUNITY): Payer: Medicare Other | Admitting: Anesthesiology

## 2016-08-08 ENCOUNTER — Inpatient Hospital Stay (HOSPITAL_COMMUNITY)
Admission: RE | Admit: 2016-08-08 | Discharge: 2016-08-10 | DRG: 470 | Disposition: A | Discharge status: Home / Self Care | Payer: Medicare Other | Source: Ambulatory Visit | Attending: Orthopedic Surgery | Admitting: Orthopedic Surgery

## 2016-08-08 ENCOUNTER — Encounter (HOSPITAL_COMMUNITY)
Admission: RE | Disposition: A | Discharge status: Home / Self Care | Payer: Self-pay | Source: Ambulatory Visit | Attending: Orthopedic Surgery

## 2016-08-08 DIAGNOSIS — I10 Essential (primary) hypertension: Secondary | ICD-10-CM | POA: Diagnosis present

## 2016-08-08 DIAGNOSIS — F17290 Nicotine dependence, other tobacco product, uncomplicated: Secondary | ICD-10-CM | POA: Diagnosis present

## 2016-08-08 DIAGNOSIS — K219 Gastro-esophageal reflux disease without esophagitis: Secondary | ICD-10-CM | POA: Diagnosis present

## 2016-08-08 DIAGNOSIS — Z7951 Long term (current) use of inhaled steroids: Secondary | ICD-10-CM

## 2016-08-08 DIAGNOSIS — Z881 Allergy status to other antibiotic agents status: Secondary | ICD-10-CM | POA: Diagnosis not present

## 2016-08-08 DIAGNOSIS — Z8261 Family history of arthritis: Secondary | ICD-10-CM

## 2016-08-08 DIAGNOSIS — Z811 Family history of alcohol abuse and dependence: Secondary | ICD-10-CM

## 2016-08-08 DIAGNOSIS — M1710 Unilateral primary osteoarthritis, unspecified knee: Secondary | ICD-10-CM | POA: Diagnosis present

## 2016-08-08 DIAGNOSIS — Z883 Allergy status to other anti-infective agents status: Secondary | ICD-10-CM | POA: Diagnosis not present

## 2016-08-08 DIAGNOSIS — R1032 Left lower quadrant pain: Secondary | ICD-10-CM | POA: Diagnosis not present

## 2016-08-08 DIAGNOSIS — K5790 Diverticulosis of intestine, part unspecified, without perforation or abscess without bleeding: Secondary | ICD-10-CM | POA: Diagnosis present

## 2016-08-08 DIAGNOSIS — M179 Osteoarthritis of knee, unspecified: Secondary | ICD-10-CM | POA: Diagnosis present

## 2016-08-08 DIAGNOSIS — Z8249 Family history of ischemic heart disease and other diseases of the circulatory system: Secondary | ICD-10-CM | POA: Diagnosis not present

## 2016-08-08 DIAGNOSIS — Z85828 Personal history of other malignant neoplasm of skin: Secondary | ICD-10-CM

## 2016-08-08 DIAGNOSIS — Z823 Family history of stroke: Secondary | ICD-10-CM | POA: Diagnosis not present

## 2016-08-08 DIAGNOSIS — M170 Bilateral primary osteoarthritis of knee: Secondary | ICD-10-CM | POA: Diagnosis present

## 2016-08-08 DIAGNOSIS — M25562 Pain in left knee: Secondary | ICD-10-CM | POA: Diagnosis not present

## 2016-08-08 DIAGNOSIS — G8918 Other acute postprocedural pain: Secondary | ICD-10-CM | POA: Diagnosis not present

## 2016-08-08 DIAGNOSIS — M1712 Unilateral primary osteoarthritis, left knee: Secondary | ICD-10-CM | POA: Diagnosis not present

## 2016-08-08 HISTORY — PX: TOTAL KNEE ARTHROPLASTY: SHX125

## 2016-08-08 LAB — TYPE AND SCREEN
ABO/RH(D): A POS
Antibody Screen: NEGATIVE

## 2016-08-08 SURGERY — ARTHROPLASTY, KNEE, TOTAL
Anesthesia: General | Site: Knee | Laterality: Left

## 2016-08-08 MED ORDER — ACETAMINOPHEN 500 MG PO TABS
1000.0000 mg | ORAL_TABLET | Freq: Four times a day (QID) | ORAL | Status: AC
Start: 2016-08-08 — End: 2016-08-09
  Administered 2016-08-08 – 2016-08-09 (×4): 1000 mg via ORAL
  Filled 2016-08-08 (×4): qty 2

## 2016-08-08 MED ORDER — HYDROMORPHONE HCL 1 MG/ML IJ SOLN
INTRAMUSCULAR | Status: AC
  Filled 2016-08-08: qty 1

## 2016-08-08 MED ORDER — METHOCARBAMOL 500 MG PO TABS
500.0000 mg | ORAL_TABLET | Freq: Four times a day (QID) | ORAL | Status: DC | PRN
  Administered 2016-08-08 – 2016-08-10 (×5): 500 mg via ORAL
  Filled 2016-08-08 (×6): qty 1

## 2016-08-08 MED ORDER — PROPOFOL 10 MG/ML IV BOLUS
INTRAVENOUS | Status: AC
  Filled 2016-08-08: qty 40

## 2016-08-08 MED ORDER — METHOCARBAMOL 1000 MG/10ML IJ SOLN
500.0000 mg | Freq: Four times a day (QID) | INTRAMUSCULAR | Status: DC | PRN
  Administered 2016-08-08: 500 mg via INTRAVENOUS
  Filled 2016-08-08: qty 550
  Filled 2016-08-08: qty 5

## 2016-08-08 MED ORDER — TRAMADOL HCL 50 MG PO TABS: 50.0000 mg | ORAL_TABLET | Freq: Four times a day (QID) | ORAL | Status: DC | PRN

## 2016-08-08 MED ORDER — DEXAMETHASONE SODIUM PHOSPHATE 10 MG/ML IJ SOLN
INTRAMUSCULAR | Status: AC
  Filled 2016-08-08: qty 1

## 2016-08-08 MED ORDER — FENTANYL CITRATE (PF) 100 MCG/2ML IJ SOLN
INTRAMUSCULAR | Status: AC
  Administered 2016-08-08: 50 ug via INTRAVENOUS
  Filled 2016-08-08: qty 2

## 2016-08-08 MED ORDER — GABAPENTIN 300 MG PO CAPS
ORAL_CAPSULE | ORAL | Status: AC
  Administered 2016-08-08: 300 mg via ORAL
  Filled 2016-08-08: qty 1

## 2016-08-08 MED ORDER — ACETAMINOPHEN 325 MG PO TABS
650.0000 mg | ORAL_TABLET | Freq: Four times a day (QID) | ORAL | Status: DC | PRN
  Administered 2016-08-10 (×2): 650 mg via ORAL
  Filled 2016-08-08 (×3): qty 2

## 2016-08-08 MED ORDER — CEFAZOLIN SODIUM-DEXTROSE 2-4 GM/100ML-% IV SOLN
2.0000 g | INTRAVENOUS | Status: AC
  Administered 2016-08-08: 2 g via INTRAVENOUS

## 2016-08-08 MED ORDER — BUPIVACAINE HCL (PF) 0.5 % IJ SOLN
INTRAMUSCULAR | Status: DC | PRN
  Administered 2016-08-08: 15 mg via INTRATHECAL

## 2016-08-08 MED ORDER — METOCLOPRAMIDE HCL 5 MG PO TABS
5.0000 mg | ORAL_TABLET | Freq: Three times a day (TID) | ORAL | Status: DC | PRN
Start: 2016-08-08 — End: 2016-08-10

## 2016-08-08 MED ORDER — BUPIVACAINE LIPOSOME 1.3 % IJ SUSP
20.0000 mL | Freq: Once | INTRAMUSCULAR | Status: DC
  Filled 2016-08-08: qty 20

## 2016-08-08 MED ORDER — ONDANSETRON HCL 4 MG/2ML IJ SOLN: 4.0000 mg | Freq: Four times a day (QID) | INTRAMUSCULAR | Status: DC | PRN

## 2016-08-08 MED ORDER — FENTANYL CITRATE (PF) 100 MCG/2ML IJ SOLN
50.0000 ug | INTRAMUSCULAR | Status: DC | PRN
  Administered 2016-08-08 (×2): 50 ug via INTRAVENOUS

## 2016-08-08 MED ORDER — POLYETHYLENE GLYCOL 3350 17 G PO PACK
17.0000 g | PACK | Freq: Every day | ORAL | Status: DC | PRN
Start: 2016-08-08 — End: 2016-08-10
  Administered 2016-08-10: 17 g via ORAL
  Filled 2016-08-08: qty 1

## 2016-08-08 MED ORDER — CEFAZOLIN SODIUM-DEXTROSE 2-4 GM/100ML-% IV SOLN
INTRAVENOUS | Status: AC
  Filled 2016-08-08: qty 100

## 2016-08-08 MED ORDER — PROPOFOL 500 MG/50ML IV EMUL
INTRAVENOUS | Status: DC | PRN
  Administered 2016-08-08: 100 ug/kg/min via INTRAVENOUS

## 2016-08-08 MED ORDER — SODIUM CHLORIDE 0.9 % IJ SOLN
INTRAMUSCULAR | Status: DC | PRN
  Administered 2016-08-08: 30 mL

## 2016-08-08 MED ORDER — DEXAMETHASONE SODIUM PHOSPHATE 10 MG/ML IJ SOLN
10.0000 mg | Freq: Once | INTRAMUSCULAR | Status: AC
  Administered 2016-08-08: 10 mg via INTRAVENOUS

## 2016-08-08 MED ORDER — TRANEXAMIC ACID 1000 MG/10ML IV SOLN
1000.0000 mg | Freq: Once | INTRAVENOUS | Status: AC
  Administered 2016-08-08: 1000 mg via INTRAVENOUS
  Filled 2016-08-08: qty 1100

## 2016-08-08 MED ORDER — SODIUM CHLORIDE 0.9 % IJ SOLN
INTRAMUSCULAR | Status: AC
  Filled 2016-08-08: qty 50

## 2016-08-08 MED ORDER — ACETAMINOPHEN 650 MG RE SUPP: 650.0000 mg | Freq: Four times a day (QID) | RECTAL | Status: DC | PRN

## 2016-08-08 MED ORDER — HYDROMORPHONE HCL 1 MG/ML IJ SOLN
0.2500 mg | INTRAMUSCULAR | Status: DC | PRN
  Administered 2016-08-08 (×4): 0.5 mg via INTRAVENOUS

## 2016-08-08 MED ORDER — BUPIVACAINE HCL (PF) 0.5 % IJ SOLN
INTRAMUSCULAR | Status: AC
  Filled 2016-08-08: qty 30

## 2016-08-08 MED ORDER — CEFAZOLIN SODIUM-DEXTROSE 2-4 GM/100ML-% IV SOLN
2.0000 g | Freq: Four times a day (QID) | INTRAVENOUS | Status: AC
  Administered 2016-08-08 – 2016-08-09 (×2): 2 g via INTRAVENOUS
  Filled 2016-08-08 (×2): qty 100

## 2016-08-08 MED ORDER — ONDANSETRON HCL 4 MG PO TABS: 4.0000 mg | ORAL_TABLET | Freq: Four times a day (QID) | ORAL | Status: DC | PRN

## 2016-08-08 MED ORDER — LORATADINE 10 MG PO TABS: 10.0000 mg | ORAL_TABLET | Freq: Every day | ORAL | Status: DC | PRN

## 2016-08-08 MED ORDER — STERILE WATER FOR IRRIGATION IR SOLN
Status: DC | PRN
  Administered 2016-08-08: 2000 mL

## 2016-08-08 MED ORDER — MORPHINE SULFATE (PF) 2 MG/ML IV SOLN
1.0000 mg | INTRAVENOUS | Status: DC | PRN
  Administered 2016-08-08: 1 mg via INTRAVENOUS
  Filled 2016-08-08: qty 1

## 2016-08-08 MED ORDER — OXYCODONE HCL 5 MG/5ML PO SOLN
5.0000 mg | Freq: Once | ORAL | Status: DC | PRN
  Filled 2016-08-08: qty 5

## 2016-08-08 MED ORDER — FLEET ENEMA 7-19 GM/118ML RE ENEM: 1.0000 | ENEMA | Freq: Once | RECTAL | Status: DC | PRN

## 2016-08-08 MED ORDER — ACETAMINOPHEN 10 MG/ML IV SOLN
1000.0000 mg | Freq: Once | INTRAVENOUS | Status: AC
  Administered 2016-08-08: 1000 mg via INTRAVENOUS

## 2016-08-08 MED ORDER — OXYCODONE HCL 5 MG PO TABS
5.0000 mg | ORAL_TABLET | ORAL | Status: DC | PRN
  Administered 2016-08-08 – 2016-08-09 (×2): 5 mg via ORAL
  Administered 2016-08-09: 12:00:00 10 mg via ORAL
  Filled 2016-08-08 (×2): qty 2
  Filled 2016-08-08: qty 1

## 2016-08-08 MED ORDER — METOCLOPRAMIDE HCL 5 MG/ML IJ SOLN: 5.0000 mg | Freq: Three times a day (TID) | INTRAMUSCULAR | Status: DC | PRN

## 2016-08-08 MED ORDER — BUPIVACAINE HCL (PF) 0.25 % IJ SOLN
INTRAMUSCULAR | Status: AC
  Filled 2016-08-08: qty 30

## 2016-08-08 MED ORDER — ONDANSETRON HCL 4 MG/2ML IJ SOLN
INTRAMUSCULAR | Status: AC
  Filled 2016-08-08: qty 2

## 2016-08-08 MED ORDER — 0.9 % SODIUM CHLORIDE (POUR BTL) OPTIME
TOPICAL | Status: DC | PRN
  Administered 2016-08-08: 1000 mL

## 2016-08-08 MED ORDER — PHENOL 1.4 % MT LIQD
1.0000 | OROMUCOSAL | Status: DC | PRN
  Filled 2016-08-08: qty 177

## 2016-08-08 MED ORDER — SODIUM CHLORIDE 0.9 % IR SOLN
Status: DC | PRN
  Administered 2016-08-08: 1000 mL

## 2016-08-08 MED ORDER — SODIUM CHLORIDE 0.9 % IV SOLN
INTRAVENOUS | Status: DC
  Administered 2016-08-08: 18:00:00 via INTRAVENOUS

## 2016-08-08 MED ORDER — GABAPENTIN 600 MG PO TABS
300.0000 mg | ORAL_TABLET | Freq: Once | ORAL | Status: DC
  Filled 2016-08-08: qty 0.5

## 2016-08-08 MED ORDER — OXYCODONE HCL 5 MG PO TABS
5.0000 mg | ORAL_TABLET | Freq: Once | ORAL | Status: DC | PRN
Start: 2016-08-08 — End: 2016-08-08

## 2016-08-08 MED ORDER — BUPIVACAINE-EPINEPHRINE (PF) 0.5% -1:200000 IJ SOLN
INTRAMUSCULAR | Status: DC | PRN
  Administered 2016-08-08: 20 mL via PERINEURAL

## 2016-08-08 MED ORDER — MECLIZINE HCL 12.5 MG PO TABS
12.5000 mg | ORAL_TABLET | Freq: Every day | ORAL | Status: DC | PRN
  Filled 2016-08-08: qty 2

## 2016-08-08 MED ORDER — GABAPENTIN 300 MG PO CAPS
300.0000 mg | ORAL_CAPSULE | Freq: Once | ORAL | Status: AC
  Administered 2016-08-08: 300 mg via ORAL

## 2016-08-08 MED ORDER — APIXABAN 2.5 MG PO TABS
2.5000 mg | ORAL_TABLET | Freq: Two times a day (BID) | ORAL | Status: DC
  Administered 2016-08-09 – 2016-08-10 (×3): 2.5 mg via ORAL
  Filled 2016-08-08 (×4): qty 1

## 2016-08-08 MED ORDER — FLUTICASONE PROPIONATE 50 MCG/ACT NA SUSP
2.0000 | Freq: Every day | NASAL | Status: DC | PRN
  Filled 2016-08-08: qty 16

## 2016-08-08 MED ORDER — ACETAMINOPHEN 10 MG/ML IV SOLN
INTRAVENOUS | Status: AC
  Filled 2016-08-08: qty 100

## 2016-08-08 MED ORDER — DOCUSATE SODIUM 100 MG PO CAPS
100.0000 mg | ORAL_CAPSULE | Freq: Two times a day (BID) | ORAL | Status: DC
  Administered 2016-08-08 – 2016-08-10 (×4): 100 mg via ORAL
  Filled 2016-08-08 (×4): qty 1

## 2016-08-08 MED ORDER — MENTHOL 3 MG MT LOZG: 1.0000 | LOZENGE | OROMUCOSAL | Status: DC | PRN

## 2016-08-08 MED ORDER — ONDANSETRON HCL 4 MG/2ML IJ SOLN
INTRAMUSCULAR | Status: DC | PRN
  Administered 2016-08-08: 4 mg via INTRAVENOUS

## 2016-08-08 MED ORDER — MIDAZOLAM HCL 2 MG/2ML IJ SOLN
1.0000 mg | INTRAMUSCULAR | Status: DC | PRN
  Administered 2016-08-08: 2 mg via INTRAVENOUS
  Filled 2016-08-08: qty 2

## 2016-08-08 MED ORDER — DEXAMETHASONE SODIUM PHOSPHATE 10 MG/ML IJ SOLN
10.0000 mg | Freq: Once | INTRAMUSCULAR | Status: AC
  Administered 2016-08-09: 09:00:00 10 mg via INTRAVENOUS
  Filled 2016-08-08: qty 1

## 2016-08-08 MED ORDER — BISACODYL 10 MG RE SUPP: 10.0000 mg | Freq: Every day | RECTAL | Status: DC | PRN

## 2016-08-08 MED ORDER — DIPHENHYDRAMINE HCL 12.5 MG/5ML PO ELIX
12.5000 mg | ORAL_SOLUTION | ORAL | Status: DC | PRN
Start: 2016-08-08 — End: 2016-08-10

## 2016-08-08 MED ORDER — TRANEXAMIC ACID 1000 MG/10ML IV SOLN
1000.0000 mg | INTRAVENOUS | Status: AC
  Administered 2016-08-08: 1000 mg via INTRAVENOUS
  Filled 2016-08-08: qty 1100

## 2016-08-08 MED ORDER — DILTIAZEM HCL ER COATED BEADS 120 MG PO CP24
120.0000 mg | ORAL_CAPSULE | Freq: Every day | ORAL | Status: DC
  Administered 2016-08-08 – 2016-08-09 (×2): 120 mg via ORAL
  Filled 2016-08-08 (×2): qty 1

## 2016-08-08 MED ORDER — PROPOFOL 10 MG/ML IV BOLUS
INTRAVENOUS | Status: DC | PRN
  Administered 2016-08-08 (×3): 50 mg via INTRAVENOUS

## 2016-08-08 MED ORDER — LACTATED RINGERS IV SOLN
INTRAVENOUS | Status: DC
  Administered 2016-08-08 (×2): via INTRAVENOUS

## 2016-08-08 MED ORDER — CHLORHEXIDINE GLUCONATE 4 % EX LIQD: 60.0000 mL | Freq: Once | CUTANEOUS | Status: DC

## 2016-08-08 MED ORDER — MIDAZOLAM HCL 2 MG/2ML IJ SOLN
INTRAMUSCULAR | Status: AC
  Filled 2016-08-08: qty 2

## 2016-08-08 MED ORDER — BUPIVACAINE LIPOSOME 1.3 % IJ SUSP
INTRAMUSCULAR | Status: DC | PRN
  Administered 2016-08-08: 20 mL

## 2016-08-08 SURGICAL SUPPLY — 49 items
BAG SPEC THK2 15X12 ZIP CLS (MISCELLANEOUS) ×1
BAG ZIPLOCK 12X15 (MISCELLANEOUS) ×2 IMPLANT
BANDAGE ACE 6X5 VEL STRL LF (GAUZE/BANDAGES/DRESSINGS) ×2 IMPLANT
BLADE SAG 18X100X1.27 (BLADE) ×2 IMPLANT
BLADE SAW SGTL 11.0X1.19X90.0M (BLADE) ×2 IMPLANT
BOWL SMART MIX CTS (DISPOSABLE) ×2 IMPLANT
CAP KNEE TOTAL 3 SIGMA ×1 IMPLANT
CEMENT HV SMART SET (Cement) ×4 IMPLANT
CUFF TOURN SGL QUICK 34 (TOURNIQUET CUFF) ×2
CUFF TRNQT CYL 34X4X40X1 (TOURNIQUET CUFF) ×1 IMPLANT
DECANTER SPIKE VIAL GLASS SM (MISCELLANEOUS) ×2 IMPLANT
DRAPE U-SHAPE 47X51 STRL (DRAPES) ×2 IMPLANT
DRSG ADAPTIC 3X8 NADH LF (GAUZE/BANDAGES/DRESSINGS) ×2 IMPLANT
DRSG PAD ABDOMINAL 8X10 ST (GAUZE/BANDAGES/DRESSINGS) ×2 IMPLANT
DURAPREP 26ML APPLICATOR (WOUND CARE) ×2 IMPLANT
ELECT REM PT RETURN 15FT ADLT (MISCELLANEOUS) ×2 IMPLANT
EVACUATOR 1/8 PVC DRAIN (DRAIN) ×2 IMPLANT
GAUZE SPONGE 4X4 12PLY STRL (GAUZE/BANDAGES/DRESSINGS) ×2 IMPLANT
GLOVE BIO SURGEON STRL SZ8 (GLOVE) ×2 IMPLANT
GLOVE BIOGEL PI IND STRL 6.5 (GLOVE) IMPLANT
GLOVE BIOGEL PI IND STRL 7.0 (GLOVE) IMPLANT
GLOVE BIOGEL PI IND STRL 7.5 (GLOVE) IMPLANT
GLOVE BIOGEL PI IND STRL 8 (GLOVE) ×1 IMPLANT
GLOVE BIOGEL PI INDICATOR 6.5 (GLOVE) ×1
GLOVE BIOGEL PI INDICATOR 7.0 (GLOVE) ×2
GLOVE BIOGEL PI INDICATOR 7.5 (GLOVE) ×3
GLOVE BIOGEL PI INDICATOR 8 (GLOVE) ×1
GLOVE SURG SS PI 6.5 STRL IVOR (GLOVE) ×1 IMPLANT
GLOVE SURG SS PI 7.5 STRL IVOR (GLOVE) ×1 IMPLANT
GOWN SPEC L3 XXLG W/TWL (GOWN DISPOSABLE) ×1 IMPLANT
GOWN STRL REUS W/TWL LRG LVL3 (GOWN DISPOSABLE) ×3 IMPLANT
GOWN STRL REUS W/TWL XL LVL3 (GOWN DISPOSABLE) ×1 IMPLANT
HANDPIECE INTERPULSE COAX TIP (DISPOSABLE) ×2
IMMOBILIZER KNEE 20 (SOFTGOODS) ×2
IMMOBILIZER KNEE 20 THIGH 36 (SOFTGOODS) ×1 IMPLANT
MANIFOLD NEPTUNE II (INSTRUMENTS) ×2 IMPLANT
PACK TOTAL KNEE CUSTOM (KITS) ×2 IMPLANT
PADDING CAST COTTON 6X4 STRL (CAST SUPPLIES) ×6 IMPLANT
POSITIONER SURGICAL ARM (MISCELLANEOUS) ×2 IMPLANT
SET HNDPC FAN SPRY TIP SCT (DISPOSABLE) ×1 IMPLANT
STRIP CLOSURE SKIN 1/2X4 (GAUZE/BANDAGES/DRESSINGS) ×4 IMPLANT
SUT MNCRL AB 4-0 PS2 18 (SUTURE) ×2 IMPLANT
SUT STRATAFIX 0 PDS 27 VIOLET (SUTURE) ×2
SUT VIC AB 2-0 CT1 27 (SUTURE) ×8
SUT VIC AB 2-0 CT1 TAPERPNT 27 (SUTURE) ×3 IMPLANT
SUTURE STRATFX 0 PDS 27 VIOLET (SUTURE) ×1 IMPLANT
TRAY FOLEY W/METER SILVER 16FR (SET/KITS/TRAYS/PACK) ×2 IMPLANT
WRAP KNEE MAXI GEL POST OP (GAUZE/BANDAGES/DRESSINGS) ×2 IMPLANT
YANKAUER SUCT BULB TIP 10FT TU (MISCELLANEOUS) ×2 IMPLANT

## 2016-08-08 NOTE — Transfer of Care (Signed)
Immediate Anesthesia Transfer of Care Note  Patient: John Austin  Procedure(s) Performed: Procedure(s) with comments: LEFT TOTAL KNEE ARTHROPLASTY (Left) - Adductor Block; Failed Spinal  Patient Location: PACU  Anesthesia Type:General and Regional  Level of Consciousness: sedated  Airway & Oxygen Therapy: Patient Spontanous Breathing and Patient connected to face mask oxygen  Post-op Assessment: Report given to RN and Post -op Vital signs reviewed and stable  Post vital signs: Reviewed and stable  Last Vitals:  Vitals:   08/08/16 1116 08/08/16 1118  BP:    Pulse: (!) 57 61  Resp: (!) 9 11  Temp:      Last Pain:  Vitals:   08/08/16 0955  TempSrc:   PainSc: 3          Complications: No apparent anesthesia complications

## 2016-08-08 NOTE — H&P (View-Only) (Signed)
John Austin DOB: 1940/08/10 Married / Language: English / Race: White Male Date of Admission:  08/08/2016 CC:  Left Knee Pain History of Present Illness  The patient is a 76 year old male who comes in for a preoperative History and Physical. The patient is scheduled for a left total knee arthroplasty to be performed by Dr. Dione Plover. Aluisio, MD at [facility] on 08-08-2016. The patient is a 76 year old male who presented for follow up of their knee. The patient is being followed for their bilateral knee pain (left worse than right) and osteoarthritis. Symptoms reported include: pain (lateral side pain), aching (left baker's cyst), popping, giving way and pain with weightbearing. The patient feels that they are doing poorly and report their pain level to be moderate. The following medication has been used for pain control: antiinflammatory medication (Celebrex). The patient has reported temporary improvement of their symptoms with: Cortisone injections. Unfortunately, his left knee is getting progressively worse. He states it is limiting what he can and cannot do. He is extremely active and does a lot of physical work, but now is getting much more difficult to do so. He has had cortisone in the past without benefit. He is at a stage now where he wants a more permanent fix for this knee because he wants to try and maintain his activity level. He is ready to proceed with surgery. Please note that he was originally scheduled for [name] but has a flare up of his diverticular disease. He was treated and now has resolved. He is rescheduled and ready to proceed at this time. They have been treated conservatively in the past for the above stated problem and despite conservative measures, they continue to have progressive pain and severe functional limitations and dysfunction. They have failed non-operative management including home exercise, medications, and injections. It is felt that they would benefit  from undergoing total joint replacement. Risks and benefits of the procedure have been discussed with the patient and they elect to proceed with surgery. There are no active contraindications to surgery such as ongoing infection or rapidly progressive neurological disease.   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)  Diverticulitis Of Colon  Osteoarthritis  High blood pressure  Skin Cancer  Vertigo  Shingles  Hypertension  Hiatal Hernia  Diverticulosis    Allergies  Cipro *FLUOROQUINOLONES*  Flagyl *ANTI-INFECTIVE AGENTS - MISC.*  altered mental status  Family History Cerebrovascular Accident  grandfather mothers side Drug / Alcohol Addiction  father Hypertension  brother and grandfather mothers side Osteoarthritis  grandfather mothers side  Social History Alcohol use  current drinker; drinks beer; less than 5 per week Children  5 or more Current work status  retired Engineer, agricultural (Currently)  no Drug/Alcohol Rehab (Previously)  no Exercise  Exercises weekly; does gym / weights Illicit drug use  no Living situation  live with spouse Marital status  married Pain Contract  no Tobacco / smoke exposure  no Tobacco use  former smoker; smoke(d) 3/4 pack(s) per day; uses 2 or more can(s) smokeless per week  Medication History Cartia XT (120MG  Capsule ER 24HR, Oral) Active. Flonase (50MCG/ACT Suspension, Nasal) Active. Vitamin D Active. MetroNIDAZOLE (0.75% Gel, External) Active. Triamcinolone Acetonide (0.1% Cream, External) Active. Fluticasone Propionate (50MCG/ACT Suspension, Nasal) Active. Aspirin (81MG  Tablet, Oral) Active. Meclizine HCl (25MG  Tablet, Oral) Active. Celecoxib (200MG  Capsule, Oral as needed) Active. Fish Oil (Oral) Specific strength unknown - Active. Multivitamin (Oral) Active. B Complex Vitamins (Oral) Active.  Past Surgical History Arthroscopy of Shoulder   bilateral Cataract Surgery  bilateral Foot Surgery  right Rotator Cuff Repair  left   Review of Systems  General Not Present- Chills, Fatigue, Fever, Memory Loss, Night Sweats, Weight Gain and Weight Loss. Skin Not Present- Eczema, Hives, Itching, Lesions and Rash. HEENT Not Present- Dentures, Double Vision, Headache, Hearing Loss, Tinnitus and Visual Loss. Respiratory Not Present- Allergies, Chronic Cough, Coughing up blood, Shortness of breath at rest and Shortness of breath with exertion. Cardiovascular Not Present- Chest Pain, Difficulty Breathing Lying Down, Murmur, Palpitations, Racing/skipping heartbeats and Swelling. Gastrointestinal Not Present- Abdominal Pain, Bloody Stool, Constipation, Diarrhea, Difficulty Swallowing, Heartburn, Jaundice, Loss of appetitie, Nausea and Vomiting. Male Genitourinary Present- Blood in Urine (Chronic for long time, has been worked up in the past) and [place] at [employer]. Not Present- Discharge, Flank Pain, Incontinence, Painful Urination, Urgency, Urinary frequency, Urinary Retention and Weak urinary stream. Musculoskeletal Present- Joint Pain. Not Present- Back Pain, Joint Swelling, Morning Stiffness, Muscle Pain, Muscle Weakness and Spasms. Neurological Not Present- Blackout spells, Difficulty with balance, Dizziness, Paralysis, Tremor and Weakness. Psychiatric Not Present- Insomnia.  Vitals Weight: 191 lb Height: 68in Weight was reported by patient. Height was reported by patient. Body Surface Area: 2 m Body Mass Index: 29.04 kg/m  Pulse: 76 (Regular)  BP: 142/76 (Sitting, Left Arm, Standard)  Physical Exam  General Mental Status -Alert, cooperative and good historian. General Appearance-pleasant, Not in acute distress. Orientation-Oriented X3. Build & Nutrition-Well nourished and Well developed.  Head and Neck Head-normocephalic, atraumatic . Neck Global Assessment - supple, no bruit auscultated on the right, no  bruit auscultated on the left.  Eye Vision-Wears corrective lenses. Pupil - Bilateral-Regular and Round. Motion - Bilateral-EOMI.  ENMT Note: upper and lower denture plates   Chest and Lung Exam Auscultation Breath sounds - clear at anterior chest wall and clear at posterior chest wall. Adventitious sounds - No Adventitious sounds.  Cardiovascular Auscultation Rhythm - Regular rate and rhythm. Heart Sounds - S1 WNL and S2 WNL. Murmurs & Other Heart Sounds - Auscultation of the heart reveals - No Murmurs.  Abdomen Palpation/Percussion Tenderness - Abdomen is non-tender to palpation. Rigidity (guarding) - Abdomen is soft. Auscultation Auscultation of the abdomen reveals - Bowel sounds normal.  Male Genitourinary Note: Not done, not pertinent to present illness   Musculoskeletal Note: On exam, he is in no distress. His left knee shows no effusion. He has varus deformity. His range is 5 to 125. He is tender medial greater than lateral, no instability. He is walking with a limp on that side. Right knee, no effusion. Range 5 to 130, slight crepitus on range of motion. No instability.  RADIOGRAPHS We reviewed his radiographs, AP and lateral of the knee. These were taken today and he has got bone-on-bone arthritis in the medial and patellofemoral compartment of the left knee. Due to the pain in the right knee, we obtained x-rays there and he also has arthritis in the right, but to a slightly lesser degree.  Assessment & Plan Primary osteoarthritis of both knees (M17.0)  Note:Surgical Plans: Left Total Knee Replacement  Disposition: Home - Straight to outpatient therapy at [facility].  PCP: Dr. Ashby Dawes - Patient has been seen preoperatively and given a verbal clearance and felt to be stable for surgery.  IV TXA  Anesthesia Issues: None except hard to get to sleep and hard to wake up  Patient was instructed on what medications to stop prior to  surgery.  Signed electronically by Joelene Millin, III PA-C

## 2016-08-08 NOTE — Anesthesia Postprocedure Evaluation (Signed)
Anesthesia Post Note  Patient: John Austin  Procedure(s) Performed: Procedure(s) (LRB): LEFT TOTAL KNEE ARTHROPLASTY (Left)  Patient location during evaluation: PACU Anesthesia Type: General Level of consciousness: awake and alert and patient cooperative Pain management: pain level controlled Vital Signs Assessment: post-procedure vital signs reviewed and stable Respiratory status: spontaneous breathing and respiratory function stable Cardiovascular status: stable Anesthetic complications: no       Last Vitals:  Vitals:   08/08/16 1520 08/08/16 1615  BP: 129/66 140/65  Pulse: (!) 52 (!) 53  Resp: 16 14  Temp: 36.6 C 36.7 C    Last Pain:  Vitals:   08/08/16 1615  TempSrc: Oral  PainSc:                  [name]

## 2016-08-08 NOTE — Op Note (Signed)
OPERATIVE REPORT-TOTAL KNEE ARTHROPLASTY   Pre-operative diagnosis- Osteoarthritis  Left knee(s)  Post-operative diagnosis- Osteoarthritis Left knee(s)  Procedure-  Left  Total Knee Arthroplasty  Surgeon- Dione Plover. Shanetra Blumenstock, MD  Assistant- Ardeen Jourdain, PA-C   Anesthesia-  GA combined with regional for post-op pain  EBL-* No blood loss amount entered *   Drains Hemovac  Tourniquet time-  Total Tourniquet Time Documented: Thigh (Left) - 32 minutes Total: Thigh (Left) - 32 minutes     Complications- None  Condition-PACU - hemodynamically stable.   Brief Clinical Note   John Austin is a 76 y.o. year old male with end stage OA of his left knee with progressively worsening pain and dysfunction. He has constant pain, with activity and at rest and significant functional deficits with difficulties even with ADLs. He has had extensive non-op management including analgesics, injections of cortisone and viscosupplements, and home exercise program, but remains in significant pain with significant dysfunction. Radiographs show bone on bone arthritis medial and patellofemoral. He presents now for left Total Knee Arthroplasty.     Procedure in detail---   The patient is brought into the operating room and positioned supine on the operating table. After successful administration of  GA combined with regional for post-op pain,   a tourniquet is placed high on the  Left thigh(s) and the lower extremity is prepped and draped in the usual sterile fashion. Time out is performed by the operating team and then the  Left lower extremity is wrapped in Esmarch, knee flexed and the tourniquet inflated to 300 mmHg.       A midline incision is made with a ten blade through the subcutaneous tissue to the level of the extensor mechanism. A fresh blade is used to make a medial parapatellar arthrotomy. Soft tissue over the proximal medial tibia is subperiosteally elevated to the joint line with a knife and  into the semimembranosus bursa with a Cobb elevator. Soft tissue over the proximal lateral tibia is elevated with attention being paid to avoiding the patellar tendon on the tibial tubercle. The patella is everted, knee flexed 90 degrees and the ACL and PCL are removed. Findings are bone on bone medial and patellofemoral with large global osteophytes.        The drill is used to create a starting hole in the distal femur and the canal is thoroughly irrigated with sterile saline to remove the fatty contents. The 5 degree Left  valgus alignment guide is placed into the femoral canal and the distal femoral cutting block is pinned to remove 10 mm off the distal femur. Resection is made with an oscillating saw.      The tibia is subluxed forward and the menisci are removed. The extramedullary alignment guide is placed referencing proximally at the medial aspect of the tibial tubercle and distally along the second metatarsal axis and tibial crest. The block is pinned to remove 57mm off the more deficient medial  side. Resection is made with an oscillating saw. Size 4is the most appropriate size for the tibia and the proximal tibia is prepared with the modular drill and keel punch for that size.      The femoral sizing guide is placed and size 4 is most appropriate. Rotation is marked off the epicondylar axis and confirmed by creating a rectangular flexion gap at 90 degrees. The size 4 cutting block is pinned in this rotation and the anterior, posterior and chamfer cuts are made with the oscillating saw. The  intercondylar block is then placed and that cut is made.      Trial size 4 tibial component, trial size 4 posterior stabilized femur and a 112.5  mm posterior stabilized rotating platform insert trial is placed. Full extension is achieved with excellent varus/valgus and anterior/posterior balance throughout full range of motion. The patella is everted and thickness measured to be 27  mm. Free hand resection is taken  to 15 mm, a 41 template is placed, lug holes are drilled, trial patella is placed, and it tracks normally. Osteophytes are removed off the posterior femur with the trial in place. All trials are removed and the cut bone surfaces prepared with pulsatile lavage. Cement is mixed and once ready for implantation, the size 4 tibial implant, size  4 posterior stabilized femoral component, and the size 41 patella are cemented in place and the patella is held with the clamp. The trial insert is placed and the knee held in full extension. The Exparel (20 ml mixed with 30 ml saline) is injected into the extensor mechanism, posterior capsule, medial and lateral gutters and subcutaneous tissues.  All extruded cement is removed and once the cement is hard the permanent 12.5 mm posterior stabilized rotating platform insert is placed into the tibial tray.      The wound is copiously irrigated with saline solution and the extensor mechanism closed over a hemovac drain with #1 V-loc suture. The tourniquet is released for a total tourniquet time of 32  minutes. Flexion against gravity is 140 degrees and the patella tracks normally. Subcutaneous tissue is closed with 2.0 vicryl and subcuticular with running 4.0 Monocryl. The incision is cleaned and dried and steri-strips and a bulky sterile dressing are applied. The limb is placed into a knee immobilizer and the patient is awakened and transported to recovery in stable condition.      Please note that a surgical assistant was a medical necessity for this procedure in order to perform it in a safe and expeditious manner. Surgical assistant was necessary to retract the ligaments and vital neurovascular structures to prevent injury to them and also necessary for proper positioning of the limb to allow for anatomic placement of the prosthesis.   Dione Plover Meleana Commerford, MD    08/08/2016, 12:44 PM

## 2016-08-08 NOTE — Anesthesia Procedure Notes (Signed)
Procedure Name: LMA Insertion Date/Time: 08/08/2016 12:00 PM Performed by: Lind Covert Pre-anesthesia Checklist: Patient identified, Emergency Drugs available, Suction available, Patient being monitored and Timeout performed Patient Re-evaluated:Patient Re-evaluated prior to inductionOxygen Delivery Method: Circle system utilized Preoxygenation: Pre-oxygenation with 100% oxygen Intubation Type: IV induction Ventilation: Mask ventilation without difficulty LMA: LMA inserted LMA Size: 4.0 Number of attempts: 1 Placement Confirmation: ETT inserted through vocal cords under direct vision,  positive ETCO2 and breath sounds checked- equal and bilateral Tube secured with: Tape Dental Injury: Teeth and Oropharynx as per pre-operative assessment

## 2016-08-08 NOTE — Anesthesia Procedure Notes (Signed)
Anesthesia Regional Block: Adductor canal block   Pre-Anesthetic Checklist: ,, timeout performed, Correct Patient, Correct Site, Correct Laterality, Correct Procedure, Correct Position, site marked, Risks and benefits discussed,  Surgical consent,  Pre-op evaluation,  At surgeon's request and post-op pain management  Laterality: Left  Prep: chloraprep       Needles:  Injection technique: Single-shot  Needle Type: Echogenic Needle     Needle Length: 9cm  Needle Gauge: 21     Additional Needles:   Procedures: ultrasound guided,,,,,,,,  Narrative:  Start time: 08/08/2016 11:08 AM End time: 08/08/2016 11:15 AM Injection made incrementally with aspirations every 5 mL.  Performed by: Personally  Anesthesiologist: Hobert Poplaski  Additional Notes: Pt tolerated the procedure well.

## 2016-08-08 NOTE — Interval H&P Note (Signed)
History and Physical Interval Note:  08/08/2016 10:42 AM  John Austin  has presented today for surgery, with the diagnosis of Osteoarthritis left knee  The various methods of treatment have been discussed with the patient and family. After consideration of risks, benefits and other options for treatment, the patient has consented to  Procedure(s): LEFT TOTAL KNEE ARTHROPLASTY (Left) as a surgical intervention .  The patient's history has been reviewed, patient examined, no change in status, stable for surgery.  I have reviewed the patient's chart and labs.  Questions were answered to the patient's satisfaction.     [name]

## 2016-08-08 NOTE — Anesthesia Preprocedure Evaluation (Signed)
Anesthesia Evaluation  Patient identified by MRN, date of birth, ID band Patient awake    Reviewed: Allergy & Precautions, H&P , NPO status , Patient's Chart, lab work & pertinent test results  History of Anesthesia Complications (+) PONV  Airway Mallampati: II   Neck ROM: full    Dental   Pulmonary former smoker,    breath sounds clear to auscultation       Cardiovascular hypertension,  Rhythm:regular Rate:Normal     Neuro/Psych    GI/Hepatic hiatal hernia, GERD  ,  Endo/Other    Renal/GU      Musculoskeletal  (+) Arthritis , Osteoarthritis,    Abdominal   Peds  Hematology   Anesthesia Other Findings   Reproductive/Obstetrics                             Anesthesia Physical Anesthesia Plan  ASA: II  Anesthesia Plan: Spinal   Post-op Pain Management:  Regional for Post-op pain   Induction: Intravenous  Airway Management Planned: Simple Face Mask  Additional Equipment:   Intra-op Plan:   Post-operative Plan:   Informed Consent: I have reviewed the patients History and Physical, chart, labs and discussed the procedure including the risks, benefits and alternatives for the proposed anesthesia with the patient or authorized representative who has indicated his/her understanding and acceptance.     Plan Discussed with: CRNA, Anesthesiologist and Surgeon  Anesthesia Plan Comments:         Anesthesia Quick Evaluation

## 2016-08-08 NOTE — Anesthesia Procedure Notes (Signed)
Spinal  Patient location during procedure: OR Start time: 08/08/2016 11:32 AM End time: 08/08/2016 11:34 AM Staffing Resident/CRNA: Harle Stanford R Performed: resident/CRNA  Preanesthetic Checklist Completed: patient identified, site marked, surgical consent, pre-op evaluation, timeout performed, IV checked, risks and benefits discussed and monitors and equipment checked Spinal Block Patient position: sitting Prep: Betadine Patient monitoring: heart rate, cardiac monitor, continuous pulse ox and blood pressure Approach: midline Location: L3-4 Injection technique: single-shot Needle Needle type: Pencan  Needle gauge: 24 G Needle length: 10 cm Needle insertion depth: 7 cm Additional Notes Timeout performed. SAB kit date checked. SAB without difficulty

## 2016-08-09 LAB — BASIC METABOLIC PANEL
Anion gap: 5 (ref 5–15)
BUN: 8 mg/dL (ref 6–20)
CALCIUM: 8.9 mg/dL (ref 8.9–10.3)
CHLORIDE: 108 mmol/L (ref 101–111)
CO2: 28 mmol/L (ref 22–32)
CREATININE: 0.6 mg/dL — AB (ref 0.61–1.24)
Glucose, Bld: 129 mg/dL — ABNORMAL HIGH (ref 65–99)
Potassium: 4.9 mmol/L (ref 3.5–5.1)
SODIUM: 141 mmol/L (ref 135–145)

## 2016-08-09 LAB — CBC
HCT: 38.8 % — ABNORMAL LOW (ref 39.0–52.0)
HEMOGLOBIN: 12.8 g/dL — AB (ref 13.0–17.0)
MCH: 26.9 pg (ref 26.0–34.0)
MCHC: 33 g/dL (ref 30.0–36.0)
MCV: 81.7 fL (ref 78.0–100.0)
PLATELETS: 245 10*3/uL (ref 150–400)
RBC: 4.75 MIL/uL (ref 4.22–5.81)
RDW: 14.1 % (ref 11.5–15.5)
WBC: 16.9 10*3/uL — ABNORMAL HIGH (ref 4.0–10.5)

## 2016-08-09 MED ORDER — OXYCODONE HCL 5 MG PO TABS: 5.0000 mg | ORAL_TABLET | ORAL | 0 refills | Status: DC | PRN

## 2016-08-09 MED ORDER — TRAMADOL HCL 50 MG PO TABS: 50.0000 mg | ORAL_TABLET | Freq: Four times a day (QID) | ORAL | 0 refills | Status: DC | PRN

## 2016-08-09 MED ORDER — METHOCARBAMOL 500 MG PO TABS: 500.0000 mg | ORAL_TABLET | Freq: Four times a day (QID) | ORAL | 0 refills | Status: DC | PRN

## 2016-08-09 MED ORDER — APIXABAN 2.5 MG PO TABS: 2.5000 mg | ORAL_TABLET | Freq: Two times a day (BID) | ORAL | 0 refills | Status: DC

## 2016-08-09 NOTE — Evaluation (Signed)
Physical Therapy Evaluation Patient Details [name] MRN: [id number] DOB: 28-Jul-1940 Today's Date: 08/09/2016   History of Present Illness  s/p L TKA  Clinical Impression  Pt is s/p TKA resulting in the deficits listed below (see PT Problem List).   Pt will benefit from skilled PT to increase their independence and safety with mobility to allow discharge to the venue listed below.  Pt tolerated ambulation and exercises well this morning.  Pt plans to receive OP PT on Friday.     Follow Up Recommendations Outpatient PT    Equipment Recommendations  None recommended by PT    Recommendations for Other Services       Precautions / Restrictions Precautions Precautions: Knee Precaution Comments: able to perform SLR Required Braces or Orthoses: Knee Immobilizer - Right Restrictions Other Position/Activity Restrictions: WBAT      Mobility  Bed Mobility Overal bed mobility: Needs Assistance Bed Mobility: Supine to Sit     Supine to sit: Supervision     General bed mobility comments: pt up in recliner on arrival  Transfers Overall transfer level: Needs assistance Equipment used: Rolling walker (2 wheeled) Transfers: Sit to/from Stand Sit to Stand: Min guard         General transfer comment: verbal cues for UE and LE positioning  Ambulation/Gait Ambulation/Gait assistance: Min guard Ambulation Distance (Feet): 100 Feet Assistive device: Rolling walker (2 wheeled) Gait Pattern/deviations: Step-to pattern;Decreased stance time - left;Antalgic     General Gait Details: verbal cues for sequence, RW positioning, step length, posture  Stairs            Wheelchair Mobility    Modified Rankin (Stroke Patients Only)       Balance                                             Pertinent Vitals/Pain Pain Assessment: 0-10 Pain Score: 4  Faces Pain Scale: Hurts a little bit Pain Location: L knee Pain Descriptors / Indicators:  Aching;Sore Pain Intervention(s): Limited activity within patient's tolerance;Monitored during session;Repositioned;Premedicated before session    [place] expects to be discharged to:: Private residence Living Arrangements: Spouse/significant other Available Help at Discharge: Family Type of Home: House Home Access: Stairs to enter   Technical brewer of Steps: 5 Home Layout: Able to live on main level with bedroom/bathroom;Laundry or work area in [place]: Tub bench;Bedside commode;Walker - 2 wheels Additional Comments: pt doesn't want to use BSC over commode    Prior Function Level of Independence: Independent               Hand Dominance        Extremity/Trunk Assessment   Upper Extremity Assessment Upper Extremity Assessment: Overall WFL for tasks assessed    Lower Extremity Assessment Lower Extremity Assessment: LLE deficits/detail LLE Deficits / Details: able to perform SLR, AAROM knee flexion 45* limited by pain       Communication   Communication: No difficulties  Cognition Arousal/Alertness: Awake/alert Behavior During Therapy: WFL for tasks assessed/performed Overall Cognitive Status: Within Functional Limits for tasks assessed                                        General Comments      Exercises Total  Joint Exercises Ankle Circles/Pumps: AROM;10 reps;Both Quad Sets: AROM;Left;10 reps Short Arc Quad: AROM;Left;10 reps Heel Slides: AAROM;Left;10 reps;Supine Hip ABduction/ADduction: AROM;Left;10 reps Straight Leg Raises: AROM;Left;10 reps   Assessment/Plan    PT Assessment Patient needs continued PT services  PT Problem List Decreased strength;Decreased range of motion;Decreased mobility;Decreased knowledge of use of DME;Decreased knowledge of precautions;Pain       PT Treatment Interventions Functional mobility training;Stair training;Gait training;DME instruction;Therapeutic  activities;Therapeutic exercise;Patient/family education    PT Goals (Current goals can be found in the Care Plan section)  Acute Rehab PT Goals Patient Stated Goal: independence; less pain PT Goal Formulation: With patient Time For Goal Achievement: 08/12/16 Potential to Achieve Goals: Good    Frequency 7X/week   Barriers to discharge        Co-evaluation               End of Session Equipment Utilized During Treatment: Gait belt Activity Tolerance: Patient tolerated treatment well Patient left: with call bell/phone within reach;with family/visitor present;in chair   PT Visit Diagnosis: Other abnormalities of gait and mobility (R26.89);Pain Pain - Right/Left: Left Pain - part of body: Knee    Time: 5146-0479 PT Time Calculation (min) (ACUTE ONLY): 21 min   Charges:   PT Evaluation $PT Eval Low Complexity: 1 Procedure     PT G Codes:        Carmelia Bake, PT, DPT 08/09/2016 Pager: [phone]   York Ram E 08/09/2016, 12:37 PM

## 2016-08-09 NOTE — Care Management Note (Signed)
Case Management Note  Patient Details  [name] MRN: [id number] Date of Birth: Jul 06, 1940  Subjective/Objective:   76 yo admitted for LEFT TOTAL KNEE ARTHROPLASTY                  Action/Plan: Pt from home with spouse. Per pt, he is set up for Outpatient PT at discharge and he has a cane, rw, 3in1, and tub seat at home already. No other CM needs communicated.  Expected Discharge Date:  08/10/16               Expected Discharge Plan:  Home/Self Care  In-House Referral:     Discharge planning Services  CM Consult  Post Acute Care Choice:    Choice offered to:     DME Arranged:    DME Agency:     HH Arranged:    HH Agency:     Status of Service:  Completed, signed off  If discussed at [employer] of Stay Meetings, dates discussed:    Additional CommentsLynnell Catalan, RN 08/09/2016, 10:47 AM  [phone]

## 2016-08-09 NOTE — Progress Notes (Signed)
   Subjective: 1 Day Post-Op Procedure(s) (LRB): LEFT TOTAL KNEE ARTHROPLASTY (Left) Patient reports pain as mild.   Patient seen in rounds with Dr. Wynelle Link. Patient is well, and has had no acute complaints or problems. Denies SOB and chest pain. No issues overnight. Reports that he was able to get some rest last night.  We will start therapy today.  Plan is to go Home after hospital stay.  Objective: Vital signs in last 24 hours: Temp:  [97.7 F (36.5 C)-98.7 F (37.1 C)] 98.1 F (36.7 C) (04/03 0556) Pulse Rate:  [52-69] 57 (04/03 0556) Resp:  [0-26] 14 (04/03 0556) BP: (120-150)/(62-76) 123/76 (04/03 0556) SpO2:  [96 %-100 %] 100 % (04/03 0556) Weight:  [86.4 kg (190 lb 9 oz)] 86.4 kg (190 lb 9 oz) (04/02 0955)  Intake/Output from previous day:  Intake/Output Summary (Last 24 hours) at 08/09/16 0815 Last data filed at 08/09/16 0545  Gross per 24 hour  Intake             4975 ml  Output             5055 ml  Net              -80 ml     Labs:  Recent Labs  08/09/16 0415  HGB 12.8*    Recent Labs  08/09/16 0415  WBC 16.9*  RBC 4.75  HCT 38.8*  PLT 245    Recent Labs  08/09/16 0415  NA 141  K 4.9  CL 108  CO2 28  BUN 8  CREATININE 0.60*  GLUCOSE 129*  CALCIUM 8.9    EXAM General - Patient is Alert and Oriented Extremity - Neurologically intact Intact pulses distally Dorsiflexion/Plantar flexion intact Compartment soft Dressing - dressing C/D/I Motor Function - intact, moving foot and toes well on exam.  Hemovac pulled without difficulty.  Past Medical History:  Diagnosis Date  . Arthritis   . Cancer (HCC)    hx of skin cancer   . Complication of anesthesia   . Essential hypertension   . GERD (gastroesophageal reflux disease)   . History of hiatal hernia   . PONV (postoperative nausea and vomiting)    per pt "slow to wake, difficult to put to sleep"     Assessment/Plan: 1 Day Post-Op Procedure(s) (LRB): LEFT TOTAL KNEE ARTHROPLASTY  (Left) Principal Problem:   OA (osteoarthritis) of knee  Estimated body mass index is 28.97 kg/m as calculated from the following:   Height as of this encounter: 5\' 8"  (1.727 m).   Weight as of this encounter: 86.4 kg (190 lb 9 oz). Advance diet Up with therapy D/C IV fluids when tolerating POs well  DVT Prophylaxis - Eliquis Weight-Bearing as tolerated  D/C O2 and Pulse OX and try on Room Air  Will start therapy today. Plan for DC home tomorrow. Will start outpatient therapy Friday at [facility] in [place].   Ardeen Jourdain, PA-C Orthopaedic Surgery 08/09/2016, 8:15 AM

## 2016-08-09 NOTE — Evaluation (Signed)
Occupational Therapy Evaluation Patient Details [name] MRN: [id number] DOB: May 03, 1941 Today's Date: 08/09/2016    History of Present Illness s/p L TKA   Clinical Impression   This 76 year old man was admitted for the above sx. All education was completed. No further OT is needed at this time    Follow Up Recommendations  No OT follow up;Supervision/Assistance - 24 hour    Equipment Recommendations  None recommended by OT    Recommendations for Other Services       Precautions / Restrictions Precautions Precautions: None;Knee Precaution Comments: Pt performed SLR; used KI this session Required Braces or Orthoses: Knee Immobilizer - Right Restrictions Weight Bearing Restrictions: No Other Position/Activity Restrictions: WBAT      Mobility Bed Mobility Overal bed mobility: Needs Assistance Bed Mobility: Supine to Sit     Supine to sit: Supervision     General bed mobility comments: from flat bed. cues for sequence  Transfers Overall transfer level: Needs assistance Equipment used: Rolling walker (2 wheeled) Transfers: Sit to/from Stand Sit to Stand: Min guard         General transfer comment: for safety.  cues for UE/LE placement    Balance                                           ADL either performed or assessed with clinical judgement   ADL Overall ADL's : Needs assistance/impaired Eating/Feeding: Independent   Grooming: Supervision/safety;Standing   Upper Body Bathing: Set up;Sitting   Lower Body Bathing: Minimal assistance;Sit to/from stand   Upper Body Dressing : Set up;Sitting   Lower Body Dressing: Moderate assistance;Sit to/from stand   Toilet Transfer: Min guard;Ambulation;RW;Comfort height toilet   Toileting- Clothing Manipulation and Hygiene: Min guard;Sit to/from stand         General ADL Comments: educated on using toilet seat and RW to for transfer; pt doesn't want to use 3:1 over toilet.  He  has a tub bench; educated on sequence. wife feels comfortable with using this     Vision         Perception     Praxis      Pertinent Vitals/Pain Pain Assessment: Faces Faces Pain Scale: Hurts a little bit Pain Location: R knee Pain Descriptors / Indicators: Sore Pain Intervention(s): Limited activity within patient's tolerance;Repositioned;Monitored during session;Premedicated before session     Hand Dominance     Extremity/Trunk Assessment Upper Extremity Assessment Upper Extremity Assessment: Overall WFL for tasks assessed           Communication Communication Communication: No difficulties   Cognition Arousal/Alertness: Awake/alert Behavior During Therapy: WFL for tasks assessed/performed Overall Cognitive Status: Within Functional Limits for tasks assessed                                     General Comments       Exercises     Shoulder Instructions      Home Living Family/patient expects to be discharged to:: Private residence Living Arrangements: Spouse/significant other Available Help at Discharge: Family               Bathroom Shower/Tub: Teaching laboratory technician Toilet: Handicapped height     Home Equipment: Tub bench;Bedside commode   Additional Comments: pt doesn't want to  use BSC over commode      Prior Functioning/Environment Level of Independence: Independent                 OT Problem List:        OT Treatment/Interventions:      OT Goals(Current goals can be found in the care plan section) Acute Rehab OT Goals Patient Stated Goal: independence; less pain  OT Frequency:     Barriers to D/C:            Co-evaluation              End of Session CPM Left Knee CPM Left Knee: Off  Activity Tolerance: Patient tolerated treatment well Patient left: with call bell/phone within reach;in chair;with family/visitor present  OT Visit Diagnosis: Pain Pain - Right/Left: Right Pain - part of  body: Knee                Time: 8563-1497 OT Time Calculation (min): 24 min Charges:  OT General Charges $OT Visit: 1 Procedure OT Evaluation $OT Eval Low Complexity: 1 Procedure OT Treatments $Self Care/Home Management : 8-22 mins G-Codes:     [place], OTR/L [phone] 08/09/2016  John Austin 08/09/2016, 9:43 AM

## 2016-08-09 NOTE — Progress Notes (Signed)
   08/09/16 1400  PT Visit Information  Last PT Received On 08/09/16  Assistance Needed +1  History of Present Illness s/p L TKA  Subjective Data  Subjective Pt ambulated again in hallway and reports more soreness this afternoon.  Precautions  Precautions Knee  Precaution Comments able to perform SLR  Restrictions  Other Position/Activity Restrictions WBAT  Pain Assessment  Pain Assessment 0-10  Pain Score 5  Pain Location L knee  Pain Descriptors / Indicators Aching;Sore  Pain Intervention(s) Limited activity within patient's tolerance;Monitored during session;Premedicated before session;Repositioned  Cognition  Arousal/Alertness Awake/alert  Behavior During Therapy WFL for tasks assessed/performed  Overall Cognitive Status Within Functional Limits for tasks assessed  Bed Mobility  Overal bed mobility Needs Assistance  Bed Mobility Supine to Sit;Sit to Supine  Supine to sit Supervision  Sit to supine Supervision  General bed mobility comments pt self assists L LE with UEs  Transfers  Overall transfer level Needs assistance  Equipment used Rolling walker (2 wheeled)  Transfers Sit to/from Stand  Sit to Stand Min guard  General transfer comment verbal cues for UE and LE positioning, requires repeated cues for UE placement  Ambulation/Gait  Ambulation/Gait assistance Min guard  Ambulation Distance (Feet) 120 Feet  Assistive device Rolling walker (2 wheeled)  Gait Pattern/deviations Step-to pattern;Decreased stance time - left;Antalgic  General Gait Details verbal cues for sequence, RW positioning, step length, posture, heel strike  PT - End of Session  Activity Tolerance Patient tolerated treatment well  Patient left in bed;with call bell/phone within reach;with family/visitor present  PT - Assessment/Plan  PT Plan Current plan remains appropriate  PT Visit Diagnosis Other abnormalities of gait and mobility (R26.89);Pain  PT Frequency (ACUTE ONLY) 7X/week  Follow Up  Recommendations Outpatient PT  PT equipment None recommended by PT  AM-PAC PT "6 Clicks" Daily Activity Outcome Measure  Difficulty turning over in bed (including adjusting bedclothes, sheets and blankets)? 4  Difficulty moving from lying on back to sitting on the side of the bed?  3  Difficulty sitting down on and standing up from a chair with arms (e.g., wheelchair, bedside commode, etc,.)? 3  Help needed moving to and from a bed to chair (including a wheelchair)? 3  Help needed walking in hospital room? 3  Help needed climbing 3-5 steps with a railing?  3  6 Click Score 19  Mobility G Code  CJ  PT Goal Progression  Progress towards PT goals Progressing toward goals  PT Time Calculation  PT Start Time (ACUTE ONLY) 1332  PT Stop Time (ACUTE ONLY) 1352  PT Time Calculation (min) (ACUTE ONLY) 20 min  PT General Charges  $$ ACUTE PT VISIT 1 Procedure  PT Treatments  $Gait Training 8-22 mins   Carmelia Bake, PT, DPT 08/09/2016 Pager: [phone]

## 2016-08-09 NOTE — Discharge Instructions (Addendum)
Dr. Gaynelle Arabian Total Joint Specialist [facility] [address], [place], [place] [address] [phone]  TOTAL KNEE REPLACEMENT POSTOPERATIVE DIRECTIONS  Knee Rehabilitation, Guidelines Following Surgery  Results after knee surgery are often greatly improved when you follow the exercise, range of motion and muscle strengthening exercises prescribed by your doctor. Safety measures are also important to protect the knee from further injury. Any time any of these exercises cause you to have increased pain or swelling in your knee joint, decrease the amount until you are comfortable again and slowly increase them. If you have problems or questions, call your caregiver or physical therapist for advice.   HOME CARE INSTRUCTIONS  Remove items at home which could result in a fall. This includes throw rugs or furniture in walking pathways.   ICE to the affected knee every three hours for 30 minutes at a time and then as needed for pain and swelling.  Continue to use ice on the knee for pain and swelling from surgery. You may notice swelling that will progress down to the foot and ankle.  This is normal after surgery.  Elevate the leg when you are not up walking on it.    Continue to use the breathing machine which will help keep your temperature down.  It is common for your temperature to cycle up and down following surgery, especially at night when you are not up moving around and exerting yourself.  The breathing machine keeps your lungs expanded and your temperature down.  Do not place pillow under knee, focus on keeping the knee straight while resting  DIET You may resume your previous home diet once your are discharged from the hospital.  DRESSING / WOUND CARE / SHOWERING You may start showering once you are discharged home but do not submerge the incision under water. Just pat the incision dry and apply a dry gauze dressing on daily. Change the surgical  dressing daily and reapply a dry dressing each time.  ACTIVITY Walk with your walker as instructed. Use walker as long as suggested by your caregivers. Avoid periods of inactivity such as sitting longer than an hour when not asleep. This helps prevent blood clots.  You may resume a sexual relationship in one month or when given the OK by your doctor.  You may return to work once you are cleared by your doctor.  Do not drive a car for 6 weeks or until released by you surgeon.  Do not drive while taking narcotics.  WEIGHT BEARING Weight bearing as tolerated with assist device (walker, cane, etc) as directed, use it as long as suggested by your surgeon or therapist, typically at least 4-6 weeks.  POSTOPERATIVE CONSTIPATION PROTOCOL Constipation - defined medically as fewer than three stools per week and severe constipation as less than one stool per week.  One of the most common issues patients have following surgery is constipation.  Even if you have a regular bowel pattern at home, your normal regimen is likely to be disrupted due to multiple reasons following surgery.  Combination of anesthesia, postoperative narcotics, change in appetite and fluid intake all can affect your bowels.  In order to avoid complications following surgery, here are some recommendations in order to help you during your recovery period.  Colace (docusate) - Pick up an over-the-counter form of Colace or another stool softener and take twice a day as long as you are requiring postoperative pain medications.  Take with a full glass  of water daily.  If you experience loose stools or diarrhea, hold the colace until you stool forms back up.  If your symptoms do not get better within 1 week or if they get worse, check with your doctor.  Dulcolax (bisacodyl) - Pick up over-the-counter and take as directed by the product packaging as needed to assist with the movement of your bowels.  Take with a full glass of water.  Use this  product as needed if not relieved by Colace only.   MiraLax (polyethylene glycol) - Pick up over-the-counter to have on hand.  MiraLax is a solution that will increase the amount of water in your bowels to assist with bowel movements.  Take as directed and can mix with a glass of water, juice, soda, coffee, or tea.  Take if you go more than two days without a movement. Do not use MiraLax more than once per day. Call your doctor if you are still constipated or irregular after using this medication for 7 days in a row.  If you continue to have problems with postoperative constipation, please contact the office for further assistance and recommendations.  If you experience "the worst abdominal pain ever" or develop nausea or vomiting, please contact the office immediatly for further recommendations for treatment.  ITCHING  If you experience itching with your medications, try taking only a single pain pill, or even half a pain pill at a time.  You can also use Benadryl over the counter for itching or also to help with sleep.   TED HOSE STOCKINGS Wear the elastic stockings on both legs for three weeks following surgery during the day but you may remove then at night for sleeping.  MEDICATIONS See your medication summary on the After Visit Summary that the nursing staff will review with you prior to discharge.  You may have some home medications which will be placed on hold until you complete the course of blood thinner medication.  It is important for you to complete the blood thinner medication as prescribed by your surgeon.  Continue your approved medications as instructed at time of discharge.  PRECAUTIONS If you experience chest pain or shortness of breath - call 911 immediately for transfer to the hospital emergency department.  If you develop a fever greater that 101 F, purulent drainage from wound, increased redness or drainage from wound, foul odor from the wound/dressing, or calf pain -  CONTACT YOUR SURGEON.                                                   FOLLOW-UP APPOINTMENTS Make sure you keep all of your appointments after your operation with your surgeon and caregivers. You should call the office at the above phone number and make an appointment for approximately two weeks after the date of your surgery or on the date instructed by your surgeon outlined in the "After Visit Summary".   RANGE OF MOTION AND STRENGTHENING EXERCISES  Rehabilitation of the knee is important following a knee injury or an operation. After just a few days of immobilization, the muscles of the thigh which control the knee become weakened and shrink (atrophy). Knee exercises are designed to build up the tone and strength of the thigh muscles and to improve knee motion. Often times heat used for twenty to thirty minutes before working out  will loosen up your tissues and help with improving the range of motion but do not use heat for the first two weeks following surgery. These exercises can be done on a training (exercise) mat, on the floor, on a table or on a bed. Use what ever works the best and is most comfortable for you Knee exercises include:  Leg Lifts - While your knee is still immobilized in a splint or cast, you can do straight leg raises. Lift the leg to 60 degrees, hold for 3 sec, and slowly lower the leg. Repeat 10-20 times 2-3 times daily. Perform this exercise against resistance later as your knee gets better.  Quad and Hamstring Sets - Tighten up the muscle on the front of the thigh (Quad) and hold for 5-10 sec. Repeat this 10-20 times hourly. Hamstring sets are done by pushing the foot backward against an object and holding for 5-10 sec. Repeat as with quad sets.   Leg Slides: Lying on your back, slowly slide your foot toward your buttocks, bending your knee up off the floor (only go as far as is comfortable). Then slowly slide your foot back down until your leg is flat on the floor  again.  Angel Wings: Lying on your back spread your legs to the side as far apart as you can without causing discomfort.  A rehabilitation program following serious knee injuries can speed recovery and prevent re-injury in the future due to weakened muscles. Contact your doctor or a physical therapist for more information on knee rehabilitation.   IF YOU ARE TRANSFERRED TO A SKILLED REHAB FACILITY If the patient is transferred to a skilled rehab facility following release from the hospital, a list of the current medications will be sent to the facility for the patient to continue.  When discharged from the skilled rehab facility, please have the facility set up the patient's [place] prior to being released. Also, the skilled facility will be responsible for providing the patient with their medications at time of release from the facility to include their pain medication, the muscle relaxants, and their blood thinner medication. If the patient is still at the rehab facility at time of the two week follow up appointment, the skilled rehab facility will also need to assist the patient in arranging follow up appointment in our office and any transportation needs.  MAKE SURE YOU:  Understand these instructions.  Get help right away if you are not doing well or get worse.    Pick up stool softner and laxative for home use following surgery while on pain medications. Do not submerge incision under water. Please use good hand washing techniques while changing dressing each day. May shower starting three days after surgery. Please use a clean towel to pat the incision dry following showers. Continue to use ice for pain and swelling after surgery. Do not use any lotions or creams on the incision until instructed by your surgeon.  INSTRUCTIONS AFTER JOINT REPLACEMENT   o Remove items at home which could result in a fall. This includes throw rugs or furniture in walking  pathways o ICE to the affected joint every three hours while awake for 30 minutes at a time, for at least the first 3-5 days, and then as needed for pain and swelling.  Continue to use ice for pain and swelling. You may notice swelling that will progress down to the foot and ankle.  This is normal after surgery.  Elevate your leg when  you are not up walking on it.   o Continue to use the breathing machine you got in the hospital (incentive spirometer) which will help keep your temperature down.  It is common for your temperature to cycle up and down following surgery, especially at night when you are not up moving around and exerting yourself.  The breathing machine keeps your lungs expanded and your temperature down.   DIET:  As you were doing prior to hospitalization, we recommend a well-balanced diet.  DRESSING / WOUND CARE / SHOWERING  You may change your dressing every day with sterile gauze.  Please use good hand washing techniques before changing the dressing.  Do not use any lotions or creams on the incision until instructed by your surgeon.  ACTIVITY  o Increase activity slowly as tolerated, but follow the weight bearing instructions below.   o No driving for 6 weeks or until further direction given by your physician.  You cannot drive while taking narcotics.  o No lifting or carrying greater than 10 lbs. until further directed by your surgeon. o Avoid periods of inactivity such as sitting longer than an hour when not asleep. This helps prevent blood clots.  o You may return to work once you are authorized by your doctor.     WEIGHT BEARING   Weight bearing as tolerated with assist device (walker, cane, etc) as directed, use it as long as suggested by your surgeon or therapist, typically at least 4-6 weeks.   EXERCISES  Results after joint replacement surgery are often greatly improved when you follow the exercise, range of motion and muscle strengthening exercises prescribed by  your doctor. Safety measures are also important to protect the joint from further injury. Any time any of these exercises cause you to have increased pain or swelling, decrease what you are doing until you are comfortable again and then slowly increase them. If you have problems or questions, call your caregiver or physical therapist for advice.   Rehabilitation is important following a joint replacement. After just a few days of immobilization, the muscles of the leg can become weakened and shrink (atrophy).  These exercises are designed to build up the tone and strength of the thigh and leg muscles and to improve motion. Often times heat used for twenty to thirty minutes before working out will loosen up your tissues and help with improving the range of motion but do not use heat for the first two weeks following surgery (sometimes heat can increase post-operative swelling).   These exercises can be done on a training (exercise) mat, on the floor, on a table or on a bed. Use whatever works the best and is most comfortable for you.    Use music or television while you are exercising so that the exercises are a pleasant break in your day. This will make your life better with the exercises acting as a break in your routine that you can look forward to.   Perform all exercises about fifteen times, three times per day or as directed.  You should exercise both the operative leg and the other leg as well.  Exercises include:    Quad Sets - Tighten up the muscle on the front of the thigh (Quad) and hold for 5-10 seconds.    Straight Leg Raises - With your knee straight (if you were given a brace, keep it on), lift the leg to 60 degrees, hold for 3 seconds, and slowly lower the leg.  Perform  this exercise against resistance later as your leg gets stronger.   Leg Slides: Lying on your back, slowly slide your foot toward your buttocks, bending your knee up off the floor (only go as far as is comfortable). Then  slowly slide your foot back down until your leg is flat on the floor again.   Angel Wings: Lying on your back spread your legs to the side as far apart as you can without causing discomfort.   Hamstring Strength:  Lying on your back, push your heel against the floor with your leg straight by tightening up the muscles of your buttocks.  Repeat, but this time bend your knee to a comfortable angle, and push your heel against the floor.  You may put a pillow under the heel to make it more comfortable if necessary.   A rehabilitation program following joint replacement surgery can speed recovery and prevent re-injury in the future due to weakened muscles. Contact your doctor or a physical therapist for more information on knee rehabilitation.    CONSTIPATION  Constipation is defined medically as fewer than three stools per week and severe constipation as less than one stool per week.  Even if you have a regular bowel pattern at home, your normal regimen is likely to be disrupted due to multiple reasons following surgery.  Combination of anesthesia, postoperative narcotics, change in appetite and fluid intake all can affect your bowels.   YOU MUST use at least one of the following options; they are listed in order of increasing strength to get the job done.  They are all available over the counter, and you may need to use some, POSSIBLY even all of these options:    Drink plenty of fluids (prune juice may be helpful) and high fiber foods Colace 100 mg by mouth twice a day  Senokot for constipation as directed and as needed Dulcolax (bisacodyl), take with full glass of water  Miralax (polyethylene glycol) once or twice a day as needed.  If you have tried all these things and are unable to have a bowel movement in the first 3-4 days after surgery call either your surgeon or your primary doctor.    If you experience loose stools or diarrhea, hold the medications until you stool forms back up.  If your  symptoms do not get better within 1 week or if they get worse, check with your doctor.  If you experience "the worst abdominal pain ever" or develop nausea or vomiting, please contact the office immediately for further recommendations for treatment.   ITCHING:  If you experience itching with your medications, try taking only a single pain pill, or even half a pain pill at a time.  You can also use Benadryl over the counter for itching or also to help with sleep.   TED HOSE STOCKINGS:  Use stockings on both legs until for at least 2 weeks or as directed by physician office. They may be removed at night for sleeping.  MEDICATIONS:  See your medication summary on the After Visit Summary that nursing will review with you.  You may have some home medications which will be placed on hold until you complete the course of blood thinner medication.  It is important for you to complete the blood thinner medication as prescribed.  PRECAUTIONS:  If you experience chest pain or shortness of breath - call 911 immediately for transfer to the hospital emergency department.   If you develop a fever greater that 101 F,  purulent drainage from wound, increased redness or drainage from wound, foul odor from the wound/dressing, or calf pain - CONTACT YOUR SURGEON.                                                   FOLLOW-UP APPOINTMENTS:  If you do not already have a post-op appointment, please call the office for an appointment to be seen by your surgeon.  Guidelines for how soon to be seen are listed in your After Visit Summary, but are typically between 1-4 weeks after surgery.  OTHER INSTRUCTIONS:   Knee Replacement:  Do not place pillow under knee, focus on keeping the knee straight while resting. CPM instructions: 0-90 degrees, 2 hours in the morning, 2 hours in the afternoon, and 2 hours in the evening. Place foam block, curve side up under heel at all times except when in CPM or when walking.  DO NOT modify,  tear, cut, or change the foam block in any way.  MAKE SURE YOU:   Understand these instructions.   Get help right away if you are not doing well or get worse.    Thank you for letting us be a part of your medical care team.  It is a privilege we respect greatly.  We hope these instructions will help you stay on track for a fast and full recovery!       Information on my medicine - ELIQUIS (apixaban)  This medication education was reviewed with me or my healthcare representative as part of my discharge preparation.  The pharmacist that spoke with me during my hospital stay was:  Eudelia Bunch, [facility]  Why was Eliquis prescribed for you? Eliquis was prescribed for you to reduce the risk of blood clots forming after orthopedic surgery.    What do You need to know about Eliquis? Take your Eliquis TWICE DAILY - one tablet in the morning and one tablet in the evening with or without food.  It would be best to take the dose about the same time each day.  If you have difficulty swallowing the tablet whole please discuss with your pharmacist how to take the medication safely.  Take Eliquis exactly as prescribed by your doctor and DO NOT stop taking Eliquis without talking to the doctor who prescribed the medication.  Stopping without other medication to take the place of Eliquis may increase your risk of developing a clot.  After discharge, you should have regular check-up appointments with your healthcare provider that is prescribing your Eliquis.  What do you do if you miss a dose? If a dose of ELIQUIS is not taken at the scheduled time, take it as soon as possible on the same day and twice-daily administration should be resumed.  The dose should not be doubled to make up for a missed dose.  Do not take more than one tablet of ELIQUIS at the same time.  Important Safety Information A possible side effect of Eliquis is bleeding. You should call your healthcare provider right  away if you experience any of the following: ? Bleeding from an injury or your nose that does not stop. ? Unusual colored urine (red or dark brown) or unusual colored stools (red or black). ? Unusual bruising for unknown reasons. ? A serious fall or if you hit your head (even if there is  no bleeding).  Some medicines may interact with Eliquis and might increase your risk of bleeding or clotting while on Eliquis. To help avoid this, consult your healthcare provider or pharmacist prior to using any new prescription or non-prescription medications, including herbals, vitamins, non-steroidal anti-inflammatory drugs (NSAIDs) and supplements.  This website has more information on Eliquis (apixaban): http://www.eliquis.com/eliquis/home

## 2016-08-09 NOTE — Progress Notes (Signed)
Patient's behavior has changed significantly since earlier this afternoon. Impulsive, snapping at his spouse confused, argumentative, getting out of bed by himself. [employer] PA notified.

## 2016-08-10 LAB — BASIC METABOLIC PANEL
ANION GAP: 9 (ref 5–15)
BUN: 10 mg/dL (ref 6–20)
CHLORIDE: 106 mmol/L (ref 101–111)
CO2: 26 mmol/L (ref 22–32)
Calcium: 9.1 mg/dL (ref 8.9–10.3)
Creatinine, Ser: 0.71 mg/dL (ref 0.61–1.24)
GFR calc non Af Amer: >60 mL/min (ref >60)
Glucose, Bld: 132 mg/dL — ABNORMAL HIGH (ref 65–99)
Potassium: 4.1 mmol/L (ref 3.5–5.1)
Sodium: 141 mmol/L (ref 135–145)

## 2016-08-10 LAB — CBC
HEMATOCRIT: 36.9 % — AB (ref 39.0–52.0)
HEMOGLOBIN: 12.4 g/dL — AB (ref 13.0–17.0)
MCH: 27.3 pg (ref 26.0–34.0)
MCHC: 33.6 g/dL (ref 30.0–36.0)
MCV: 81.1 fL (ref 78.0–100.0)
Platelets: 265 10*3/uL (ref 150–400)
RBC: 4.55 MIL/uL (ref 4.22–5.81)
RDW: 14.5 % (ref 11.5–15.5)
WBC: 19.9 10*3/uL — AB (ref 4.0–10.5)

## 2016-08-10 NOTE — Progress Notes (Signed)
RN reviewed discharge instructions with patient and family. All questions answered.   Paperwork and prescriptions given.   NT rolled patient down with all belongings to family car. 

## 2016-08-10 NOTE — Progress Notes (Signed)
qPhysical Therapy Treatment Patient Details [name] MRN: [id number] DOB: 1941/04/22 Today's Date: 08/10/2016    History of Present Illness s/p L TKA    PT Comments    Pt ambulated in hallway and practiced safe stair technique.  Pt also performed LE exercises and provided with HEP handout.  Pt feels ready for d/c home today.  Follow Up Recommendations  Outpatient PT     Equipment Recommendations  None recommended by PT    Recommendations for Other Services       Precautions / Restrictions Precautions Precautions: Knee Precaution Comments: able to perform SLR Restrictions Other Position/Activity Restrictions: WBAT    Mobility  Bed Mobility Overal bed mobility: Needs Assistance Bed Mobility: Supine to Sit     Supine to sit: Supervision     General bed mobility comments: pt self assists L LE with UEs  Transfers Overall transfer level: Needs assistance Equipment used: Rolling walker (2 wheeled) Transfers: Sit to/from Stand Sit to Stand: Min guard         General transfer comment: verbal cues for UE positioning  Ambulation/Gait Ambulation/Gait assistance: Min guard Ambulation Distance (Feet): 160 Feet Assistive device: Rolling walker (2 wheeled) Gait Pattern/deviations: Step-through pattern;Decreased stance time - left;Antalgic     General Gait Details: verbal cues for RW positioning, step length, heel strike   Stairs Stairs: Yes   Stair Management: Step to pattern;Backwards;With walker Number of Stairs: 4 General stair comments: verbal cues for sequence, safety, technique, RW positioning, spouse present and observed, pt states his steps are to a basement like area and he only has one step to get into home so verbally discussed one step forwards with RW  Wheelchair Mobility    Modified Rankin (Stroke Patients Only)       Balance                                            Cognition Arousal/Alertness:  Awake/alert Behavior During Therapy: WFL for tasks assessed/performed Overall Cognitive Status: Within Functional Limits for tasks assessed                                        Exercises Total Joint Exercises Ankle Circles/Pumps: AROM;10 reps;Both Quad Sets: AROM;Left;10 reps Short Arc Quad: AROM;Left;10 reps Heel Slides: Left;10 reps;AAROM;Seated Hip ABduction/ADduction: AROM;Left;10 reps Straight Leg Raises: AROM;Left;10 reps    General Comments        Pertinent Vitals/Pain Pain Assessment: 0-10 Pain Score: 4  Pain Location: L knee Pain Descriptors / Indicators: Aching;Sore Pain Intervention(s): Limited activity within patient's tolerance;Monitored during session;Repositioned    Home Living                      Prior Function            PT Goals (current goals can now be found in the care plan section) Progress towards PT goals: Progressing toward goals    Frequency    7X/week      PT Plan Current plan remains appropriate    Co-evaluation             End of Session   Activity Tolerance: Patient tolerated treatment well Patient left: with call bell/phone within reach;with family/visitor present;in chair   PT Visit Diagnosis: Other abnormalities  of gait and mobility (R26.89);Pain Pain - Right/Left: Left Pain - part of body: Knee     Time: 9355-2174 PT Time Calculation (min) (ACUTE ONLY): 20 min  Charges:  $Gait Training: 8-22 mins                    G Codes:       Carmelia Bake, PT, DPT 08/10/2016 Pager: [phone]    York Ram E 08/10/2016, 1:15 PM

## 2016-08-10 NOTE — Progress Notes (Signed)
   Subjective: 2 Days Post-Op Procedure(s) (LRB): LEFT TOTAL KNEE ARTHROPLASTY (Left) Patient reports pain as mild.   Patient seen in rounds with Dr. Wynelle Link. Patient is well, and has had no acute complaints or problems. He is feeling much more like himself today. Had some issues with confusion and impulsive behavior. No SOB or chest pain. Voiding well . Positive flatus.  Plan is to go Home after hospital stay.  Objective: Vital signs in last 24 hours: Temp:  [98.4 F (36.9 C)-98.8 F (37.1 C)] (P) 98.4 F (36.9 C) (04/04 0551) Pulse Rate:  [65-78] (P) 65 (04/04 0551) Resp:  [14-15] (P) 15 (04/04 0551) BP: (126-144)/(62-72) (P) 133/64 (04/04 0551) SpO2:  [97 %-99 %] (P) 97 % (04/04 0551)  Intake/Output from previous day:  Intake/Output Summary (Last 24 hours) at 08/10/16 0720 Last data filed at 08/10/16 0225  Gross per 24 hour  Intake             1260 ml  Output              200 ml  Net             1060 ml     Labs:  Recent Labs  08/09/16 0415 08/10/16 0413  HGB 12.8* 12.4*    Recent Labs  08/09/16 0415 08/10/16 0413  WBC 16.9* 19.9*  RBC 4.75 4.55  HCT 38.8* 36.9*  PLT 245 265    Recent Labs  08/09/16 0415 08/10/16 0413  NA 141 141  K 4.9 4.1  CL 108 106  CO2 28 26  BUN 8 10  CREATININE 0.60* 0.71  GLUCOSE 129* 132*  CALCIUM 8.9 9.1    EXAM General - Patient is Alert and Oriented Extremity - Neurologically intact Intact pulses distally Dorsiflexion/Plantar flexion intact No cellulitis present Compartment soft Dressing/Incision - clean, dry, no drainage Motor Function - intact, moving foot and toes well on exam.   Past Medical History:  Diagnosis Date  . Arthritis   . Cancer (HCC)    hx of skin cancer   . Complication of anesthesia   . Essential hypertension   . GERD (gastroesophageal reflux disease)   . History of hiatal hernia   . PONV (postoperative nausea and vomiting)    per pt "slow to wake, difficult to put to sleep"      Assessment/Plan: 2 Days Post-Op Procedure(s) (LRB): LEFT TOTAL KNEE ARTHROPLASTY (Left) Principal Problem:   OA (osteoarthritis) of knee  Estimated body mass index is 28.97 kg/m as calculated from the following:   Height as of this encounter: 5\' 8"  (1.727 m).   Weight as of this encounter: 86.4 kg (190 lb 9 oz). Advance diet Up with therapy Discharge home   DVT Prophylaxis - Eliquis Weight Bearing As Tolerated   Patient is doing much better this morning since DC of narcotics. Will continue therapy today. Plan for DC home today. Discharge instructions given. Will start outpatient therapy in [place] on Friday.  Ardeen Jourdain, PA-C Orthopaedic Surgery 08/10/2016, 7:20 AM

## 2016-08-12 ENCOUNTER — Ambulatory Visit: Payer: Medicare Other | Attending: Orthopedic Surgery | Admitting: Physical Therapy

## 2016-08-12 DIAGNOSIS — M25562 Pain in left knee: Secondary | ICD-10-CM | POA: Insufficient documentation

## 2016-08-12 DIAGNOSIS — G8929 Other chronic pain: Secondary | ICD-10-CM | POA: Insufficient documentation

## 2016-08-12 DIAGNOSIS — R600 Localized edema: Secondary | ICD-10-CM | POA: Insufficient documentation

## 2016-08-12 DIAGNOSIS — M25662 Stiffness of left knee, not elsewhere classified: Secondary | ICD-10-CM | POA: Diagnosis not present

## 2016-08-12 NOTE — Therapy (Signed)
[facility] [place], [place], [address] Phone: [phone]   Fax:  [phone]  Physical Therapy Evaluation  Patient Details  [name] MRN: [id number] Date of Birth: 01/14/1941 Referring Provider: Gaynelle Arabian MD  Encounter Date: 08/12/2016      PT End of Session - 08/12/16 1129    Visit Number 1   Number of Visits 12   Date for PT Re-Evaluation 10/11/16   PT Start Time 0950   PT Stop Time 1044   PT Time Calculation (min) 54 min   Activity Tolerance Patient tolerated treatment well   Behavior During Therapy [facility] for tasks assessed/performed      Past Medical History:  Diagnosis Date  . Arthritis   . Cancer (HCC)    hx of skin cancer   . Complication of anesthesia   . Essential hypertension   . GERD (gastroesophageal reflux disease)   . History of hiatal hernia   . PONV (postoperative nausea and vomiting)    per pt "slow to wake, difficult to put to sleep"     Past Surgical History:  Procedure Laterality Date  . BILATERAL SHOULDER SURGERY    . COLONOSCOPY    . EYE SURGERY     cataract extractions   . eyebrow surgery     . RIGHT FOOT SURGERY    . 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       Subjective Assessment - 08/12/16 1121    Subjective The patient underwent a left total knee replacement on 08/08/16.  We went over his HEP.  He is pleased with how well he is doing thus far.  His pain-level is a 6/10 today.  Rest decreases his pain and walking and increased activity increases his pain.   Patient Stated Goals Get out of pain and back to normal.   Pain Score 6    Pain Location Knee   Pain Orientation Left   Pain Descriptors / Indicators Aching   Pain Type Surgical pain   Pain  Frequency Constant   Multiple Pain Sites No            [facility] PT Assessment - 08/12/16 0001      Assessment   Medical Diagnosis Left total knee replacement.   Referring Provider Gaynelle Arabian MD   Onset Date/Surgical Date --  08/08/16 (surgery date).     Precautions   Precautions --  No ultrasound.     Restrictions   Weight Bearing Restrictions No     Balance Screen   Has the patient fallen in the past 6 months No   Has the patient had a decrease in activity level because of a fear of falling?  No   Is the patient reluctant to leave their home because of a fear of falling?  No     Home Environment   Living Environment Private residence     Prior Function   Level of Independence Independent     Observation/Other Assessments   Observations Sterile gauze over steri-strips.     Observation/Other Assessments-Edema    Edema --  Left knee 46 cms and right= 43 cms.     ROM / Strength   AROM / PROM / Strength AROM;Strength     AROM  Overall AROM Comments -10 degrees of active right knee extension and -7 degrees passive with active flexion to 87 degrees and passive to 94 degrees.     Strength   Overall Strength Comments Left hip strength= 4-/5 and left knee strength= 4/5.     Palpation   Palpation comment Palpably tender diffusely around the patient's left anterior knee.     Transfers   Transfers --  Independent.     Ambulation/Gait   Gait Pattern Decreased step length - right;Decreased step length - left;Decreased stance time - left;Decreased stride length;Antalgic   Gait Comments Safe ambulation with a FWW.                   [facility] Adult PT Treatment/Exercise - 08/12/16 0001      Exercises   Exercises Knee/Hip     Knee/Hip Exercises: Aerobic   Nustep Level 3 x 8 minutes moving forward x 1 to increase flexion.     Modalities   Modalities Location manager Stimulation Location Left knee.    Electrical Stimulation Action IFC at 1-10 Hz x 15 minutes.   Electrical Stimulation Goals Edema;Pain     Vasopneumatic   Number Minutes Vasopneumatic  15 minutes   Vasopnuematic Location  --  Left knee.   Vasopneumatic Pressure Medium                  PT Short Term Goals - 08/12/16 1319      PT SHORT TERM GOAL #1   Title Full active left knee extension in order to normalize gait.           PT Long Term Goals - 08/12/16 1321      PT LONG TERM GOAL #1   Title Independent with a HEP.   Time 8   Period Weeks   Status New     PT LONG TERM GOAL #2   Title Active right knee flexion to 115 degrees+ so the patient can perform functional tasks and do so with pain not > 2-3/10.   Time 8   Period Weeks   Status New     PT LONG TERM GOAL #3   Title Increase knee strength to a solid 5/5 to provide good stability for accomplishment of functional activities   Time 8   Period Weeks   Status New     PT LONG TERM GOAL #4   Title Perform a reciprocating stair gait with one railing with pain not > 2-3/10.   Time 8   Period Weeks   Status New     PT LONG TERM GOAL #5   Title Perform ADL's with pain not > 3/10.   Time 8   Period Weeks   Status New               Plan - 08/12/16 1241    Clinical Impression Statement The patient presents to OPPT s/p left total knee replacement performed on 08/08/16.  He lacks extension and flexion but is doing very well.  His edema is an expected level as well.  The patient also has expected losses of left knee and hip strength.  Patient will benefit from skilled physical therapy to include range of motion and strengthening and modalites to decrease pain and swelling.   Rehab Potential Excellent   PT Frequency 3x / week   PT Duration 4 weeks   PT Treatment/Interventions ADLs/Self Care Home Management;Cryotherapy;Occupational psychologist;Therapeutic  activities;Therapeutic exercise;Neuromuscular  re-education;Patient/family education;Manual techniques;Passive range of motion;Vasopneumatic Device   PT Next Visit Plan Nustep; ROM.  TKA protocol.  Vasopnuematic and electrical stimulation.   Consulted and Agree with Plan of Care Patient      Patient will benefit from skilled therapeutic intervention in order to improve the following deficits and impairments:  Pain, Increased edema, Decreased strength, Decreased range of motion, Abnormal gait  Visit Diagnosis: Chronic pain of left knee - Plan: PT plan of care cert/re-cert  Stiffness of left knee, not elsewhere classified - Plan: PT plan of care cert/re-cert  Localized edema - Plan: PT plan of care cert/re-cert      G-Codes - 27/07/86 1038    Functional Assessment Tool Used (Outpatient Only) FOTO...70% limitation.   Functional Limitation Mobility: Walking and moving around   Mobility: Walking and Moving Around Current Status [phone]) At least 60 percent but less than 80 percent impaired, limited or restricted   Mobility: Walking and Moving Around Goal Status [phone]) At least 20 percent but less than 40 percent impaired, limited or restricted       Problem List Patient Active Problem List   Diagnosis Date Noted  . OA (osteoarthritis) of knee 08/08/2016  . Leukocytosis 04/29/2015  . Abdominal pain, left lower quadrant 04/29/2015  . Acute diverticulitis 04/27/2015  . Essential hypertension 04/27/2015    Zeven Kocak, [place] MPT 08/12/2016, 1:26 PM  [facility] [address] [place], [place], [address] Phone: [phone]   Fax:  [phone]  [name] MRN: [id number] Date of Birth: May 02, 1941

## 2016-08-15 ENCOUNTER — Encounter: Payer: Self-pay | Admitting: Physical Therapy

## 2016-08-15 ENCOUNTER — Ambulatory Visit: Payer: Medicare Other | Admitting: Physical Therapy

## 2016-08-15 DIAGNOSIS — M25662 Stiffness of left knee, not elsewhere classified: Secondary | ICD-10-CM

## 2016-08-15 DIAGNOSIS — G8929 Other chronic pain: Secondary | ICD-10-CM

## 2016-08-15 DIAGNOSIS — R600 Localized edema: Secondary | ICD-10-CM

## 2016-08-15 DIAGNOSIS — M25562 Pain in left knee: Principal | ICD-10-CM

## 2016-08-15 NOTE — Discharge Summary (Signed)
Physician Discharge Summary   Patient ID: John Austin MRN: [id number] DOB/AGE: 76-Jan-1942 76 y.o.  Admit date: 08/08/2016 Discharge date: 08/10/2016  Primary Diagnosis: Primary osteoarthritis left knee   Admission Diagnoses:  Past Medical History:  Diagnosis Date  . Arthritis   . Cancer (HCC)    hx of skin cancer   . Complication of anesthesia   . Essential hypertension   . GERD (gastroesophageal reflux disease)   . History of hiatal hernia   . PONV (postoperative nausea and vomiting)    per pt "slow to wake, difficult to put to sleep"    Discharge Diagnoses:   Principal Problem:   OA (osteoarthritis) of knee  Estimated body mass index is 28.97 kg/m as calculated from the following:   Height as of this encounter: _0  (1.727 m).   Weight as of this encounter: 86.4 kg (190 lb 9 oz).  Procedure:  Procedure(s) (LRB): LEFT TOTAL KNEE ARTHROPLASTY (Left)   Consults: None  HPI: The patient is a 76 year old male who presented for follow up of their knee. The patient is being followed for their bilateral knee pain (left worse than right) and osteoarthritis. Symptoms reported include: pain (lateral side pain), aching (left baker's cyst), popping, giving way and pain with weightbearing. The patient feels that they are doing poorly and report their pain level to be moderate. The following medication has been used for pain control: antiinflammatory medication (Celebrex). The patient has reported temporary improvement of their symptoms with: Cortisone injections. Unfortunately, his left knee is getting progressively worse. He states it is limiting what he can and cannot do. He is extremely active and does a lot of physical work, but now is getting much more difficult to do so. He has had cortisone in the past without benefit. He is at a stage now where he wants a more permanent fix for this knee because he wants to try and maintain his activity level. He is ready to proceed with  surgery. Please note that he was originally scheduled for [name] but has a flare up of his diverticular disease. He was treated and now has resolved. He is rescheduled and ready to proceed at this time. They have been treated conservatively in the past for the above stated problem and despite conservative measures, they continue to have progressive pain and severe functional limitations and dysfunction. They have failed non-operative management including home exercise, medications, and injections. It is felt that they would benefit from undergoing total joint replacement. Risks and benefits of the procedure have been discussed with the patient and they elect to proceed with surgery. There are no active contraindications to surgery such as ongoing infection or rapidly progressive neurological disease.  Laboratory Data: Admission on 08/08/2016, Discharged on 08/10/2016  Component Date Value Ref Range Status  . WBC 08/09/2016 16.9* 4.0 - 10.5 K/uL Final  . RBC 08/09/2016 4.75  4.22 - 5.81 MIL/uL Final  . Hemoglobin 08/09/2016 12.8* 13.0 - 17.0 g/dL Final  . HCT 08/09/2016 38.8* 39.0 - 52.0 % Final  . MCV 08/09/2016 81.7  78.0 - 100.0 fL Final  . MCH 08/09/2016 26.9  26.0 - 34.0 pg Final  . MCHC 08/09/2016 33.0  30.0 - 36.0 g/dL Final  . RDW 08/09/2016 14.1  11.5 - 15.5 % Final  . Platelets 08/09/2016 245  150 - 400 K/uL Final  . Sodium 08/09/2016 141  135 - 145 mmol/L Final  . Potassium 08/09/2016 4.9  3.5 - 5.1 mmol/L Final  .  Chloride 08/09/2016 108  101 - 111 mmol/L Final  . CO2 08/09/2016 28  22 - 32 mmol/L Final  . Glucose, Bld 08/09/2016 129* 65 - 99 mg/dL Final  . BUN 08/09/2016 8  6 - 20 mg/dL Final  . Creatinine, Ser 08/09/2016 0.60* 0.61 - 1.24 mg/dL Final  . Calcium 08/09/2016 8.9  8.9 - 10.3 mg/dL Final  . GFR calc non Af Amer 08/09/2016 >60  >60 mL/min Final  . GFR calc Af Amer 08/09/2016 >60  >60 mL/min Final   Comment: (NOTE) The eGFR has been calculated using the CKD EPI  equation. This calculation has not been validated in all clinical situations. eGFR's persistently <60 mL/min signify possible Chronic Kidney Disease.   . Anion gap 08/09/2016 5  5 - 15 Final  . WBC 08/10/2016 19.9* 4.0 - 10.5 K/uL Final  . RBC 08/10/2016 4.55  4.22 - 5.81 MIL/uL Final  . Hemoglobin 08/10/2016 12.4* 13.0 - 17.0 g/dL Final  . HCT 08/10/2016 36.9* 39.0 - 52.0 % Final  . MCV 08/10/2016 81.1  78.0 - 100.0 fL Final  . MCH 08/10/2016 27.3  26.0 - 34.0 pg Final  . MCHC 08/10/2016 33.6  30.0 - 36.0 g/dL Final  . RDW 08/10/2016 14.5  11.5 - 15.5 % Final  . Platelets 08/10/2016 265  150 - 400 K/uL Final  . Sodium 08/10/2016 141  135 - 145 mmol/L Final  . Potassium 08/10/2016 4.1  3.5 - 5.1 mmol/L Final  . Chloride 08/10/2016 106  101 - 111 mmol/L Final  . CO2 08/10/2016 26  22 - 32 mmol/L Final  . Glucose, Bld 08/10/2016 132* 65 - 99 mg/dL Final  . BUN 08/10/2016 10  6 - 20 mg/dL Final  . Creatinine, Ser 08/10/2016 0.71  0.61 - 1.24 mg/dL Final  . Calcium 08/10/2016 9.1  8.9 - 10.3 mg/dL Final  . GFR calc non Af Amer 08/10/2016 >60  >60 mL/min Final  . GFR calc Af Amer 08/10/2016 >60  >60 mL/min Final   Comment: (NOTE) The eGFR has been calculated using the CKD EPI equation. This calculation has not been validated in all clinical situations. eGFR's persistently <60 mL/min signify possible Chronic Kidney Disease.   Georgiann Hahn gap 08/10/2016 9  5 - 15 Final  Hospital Outpatient Visit on 08/03/2016  Component Date Value Ref Range Status  . aPTT 08/03/2016 29  24 - 36 seconds Final  . WBC 08/03/2016 9.9  4.0 - 10.5 K/uL Final  . RBC 08/03/2016 5.14  4.22 - 5.81 MIL/uL Final  . Hemoglobin 08/03/2016 13.7  13.0 - 17.0 g/dL Final  . HCT 08/03/2016 40.7  39.0 - 52.0 % Final  . MCV 08/03/2016 79.2  78.0 - 100.0 fL Final  . MCH 08/03/2016 26.7  26.0 - 34.0 pg Final  . MCHC 08/03/2016 33.7  30.0 - 36.0 g/dL Final  . RDW 08/03/2016 14.2  11.5 - 15.5 % Final  . Platelets 08/03/2016  297  150 - 400 K/uL Final  . Sodium 08/03/2016 140  135 - 145 mmol/L Final  . Potassium 08/03/2016 3.8  3.5 - 5.1 mmol/L Final  . Chloride 08/03/2016 103  101 - 111 mmol/L Final  . CO2 08/03/2016 30  22 - 32 mmol/L Final  . Glucose, Bld 08/03/2016 95  65 - 99 mg/dL Final  . BUN 08/03/2016 8  6 - 20 mg/dL Final  . Creatinine, Ser 08/03/2016 0.81  0.61 - 1.24 mg/dL Final  . Calcium 08/03/2016 9.2  8.9 -  10.3 mg/dL Final  . Total Protein 08/03/2016 7.1  6.5 - 8.1 g/dL Final  . Albumin 08/03/2016 3.9  3.5 - 5.0 g/dL Final  . AST 08/03/2016 23  15 - 41 U/L Final  . ALT 08/03/2016 24  17 - 63 U/L Final  . Alkaline Phosphatase 08/03/2016 57  38 - 126 U/L Final  . Total Bilirubin 08/03/2016 1.0  0.3 - 1.2 mg/dL Final  . GFR calc non Af Amer 08/03/2016 >60  >60 mL/min Final  . GFR calc Af Amer 08/03/2016 >60  >60 mL/min Final   Comment: (NOTE) The eGFR has been calculated using the CKD EPI equation. This calculation has not been validated in all clinical situations. eGFR's persistently <60 mL/min signify possible Chronic Kidney Disease.   . Anion gap 08/03/2016 7  5 - 15 Final  . Prothrombin Time 08/03/2016 13.6  11.4 - 15.2 seconds Final  . INR 08/03/2016 1.03   Final  . ABO/RH(D) 08/03/2016 A POS   Final  . Antibody Screen 08/03/2016 NEG   Final  . Sample Expiration 08/03/2016 08/11/2016   Final  . Extend sample reason 08/03/2016 NO TRANSFUSIONS OR PREGNANCY IN THE PAST 3 MONTHS   Final  . MRSA, PCR 08/03/2016 NEGATIVE  NEGATIVE Final  . Staphylococcus aureus 08/03/2016 NEGATIVE  NEGATIVE Final   Comment:        The Xpert SA Assay (FDA approved for NASAL specimens in patients over 69 years of age), is one component of a comprehensive surveillance program.  Test performance has been validated by [facility] for patients greater than or equal to 78 year old. It is not intended to diagnose infection nor to guide or monitor treatment.   Admission on 07/03/2016, Discharged on  07/03/2016  Component Date Value Ref Range Status  . Lipase 07/03/2016 20  11 - 51 U/L Final  . Sodium 07/03/2016 141  135 - 145 mmol/L Final  . Potassium 07/03/2016 3.8  3.5 - 5.1 mmol/L Final  . Chloride 07/03/2016 104  101 - 111 mmol/L Final  . CO2 07/03/2016 30  22 - 32 mmol/L Final  . Glucose, Bld 07/03/2016 93  65 - 99 mg/dL Final  . BUN 07/03/2016 7  6 - 20 mg/dL Final  . Creatinine, Ser 07/03/2016 0.73  0.61 - 1.24 mg/dL Final  . Calcium 07/03/2016 9.0  8.9 - 10.3 mg/dL Final  . Total Protein 07/03/2016 6.7  6.5 - 8.1 g/dL Final  . Albumin 07/03/2016 3.8  3.5 - 5.0 g/dL Final  . AST 07/03/2016 19  15 - 41 U/L Final  . ALT 07/03/2016 16* 17 - 63 U/L Final  . Alkaline Phosphatase 07/03/2016 58  38 - 126 U/L Final  . Total Bilirubin 07/03/2016 0.9  0.3 - 1.2 mg/dL Final  . GFR calc non Af Amer 07/03/2016 >60  >60 mL/min Final  . GFR calc Af Amer 07/03/2016 >60  >60 mL/min Final   Comment: (NOTE) The eGFR has been calculated using the CKD EPI equation. This calculation has not been validated in all clinical situations. eGFR's persistently <60 mL/min signify possible Chronic Kidney Disease.   . Anion gap 07/03/2016 7  5 - 15 Final  . WBC 07/03/2016 11.7* 4.0 - 10.5 K/uL Final  . RBC 07/03/2016 5.00  4.22 - 5.81 MIL/uL Final  . Hemoglobin 07/03/2016 13.7  13.0 - 17.0 g/dL Final  . HCT 07/03/2016 40.1  39.0 - 52.0 % Final  . MCV 07/03/2016 80.2  78.0 - 100.0 fL Final  .  MCH 07/03/2016 27.4  26.0 - 34.0 pg Final  . MCHC 07/03/2016 34.2  30.0 - 36.0 g/dL Final  . RDW 07/03/2016 14.6  11.5 - 15.5 % Final  . Platelets 07/03/2016 252  150 - 400 K/uL Final  . Color, Urine 07/03/2016 STRAW* YELLOW Final  . APPearance 07/03/2016 CLEAR  CLEAR Final  . Specific Gravity, Urine 07/03/2016 1.002* 1.005 - 1.030 Final  . pH 07/03/2016 8.0  5.0 - 8.0 Final  . Glucose, UA 07/03/2016 NEGATIVE  NEGATIVE mg/dL Final  . Hgb urine dipstick 07/03/2016 MODERATE* NEGATIVE Final  . Bilirubin Urine  07/03/2016 NEGATIVE  NEGATIVE Final  . Ketones, ur 07/03/2016 NEGATIVE  NEGATIVE mg/dL Final  . Protein, ur 07/03/2016 NEGATIVE  NEGATIVE mg/dL Final  . Nitrite 07/03/2016 NEGATIVE  NEGATIVE Final  . Leukocytes, UA 07/03/2016 NEGATIVE  NEGATIVE Final  . RBC / HPF 07/03/2016 0-5  0 - 5 RBC/hpf Final  . WBC, UA 07/03/2016 0-5  0 - 5 WBC/hpf Final  . Bacteria, UA 07/03/2016 NONE SEEN  NONE SEEN Final  . Squamous Epithelial / LPF 07/03/2016 NONE SEEN  NONE SEEN Final  Hospital Outpatient Visit on 06/28/2016  Component Date Value Ref Range Status  . aPTT 06/28/2016 30  24 - 36 seconds Final  . WBC 06/28/2016 8.3  4.0 - 10.5 K/uL Final  . RBC 06/28/2016 5.08  4.22 - 5.81 MIL/uL Final  . Hemoglobin 06/28/2016 13.8  13.0 - 17.0 g/dL Final  . HCT 06/28/2016 41.0  39.0 - 52.0 % Final  . MCV 06/28/2016 80.7  78.0 - 100.0 fL Final  . MCH 06/28/2016 27.2  26.0 - 34.0 pg Final  . MCHC 06/28/2016 33.7  30.0 - 36.0 g/dL Final  . RDW 06/28/2016 14.5  11.5 - 15.5 % Final  . Platelets 06/28/2016 272  150 - 400 K/uL Final  . Sodium 06/28/2016 140  135 - 145 mmol/L Final  . Potassium 06/28/2016 3.5  3.5 - 5.1 mmol/L Final  . Chloride 06/28/2016 102  101 - 111 mmol/L Final  . CO2 06/28/2016 31  22 - 32 mmol/L Final  . Glucose, Bld 06/28/2016 108* 65 - 99 mg/dL Final  . BUN 06/28/2016 10  6 - 20 mg/dL Final  . Creatinine, Ser 06/28/2016 0.90  0.61 - 1.24 mg/dL Final  . Calcium 06/28/2016 9.5  8.9 - 10.3 mg/dL Final  . Total Protein 06/28/2016 7.0  6.5 - 8.1 g/dL Final  . Albumin 06/28/2016 4.1  3.5 - 5.0 g/dL Final  . AST 06/28/2016 22  15 - 41 U/L Final  . ALT 06/28/2016 18  17 - 63 U/L Final  . Alkaline Phosphatase 06/28/2016 56  38 - 126 U/L Final  . Total Bilirubin 06/28/2016 0.9  0.3 - 1.2 mg/dL Final  . GFR calc non Af Amer 06/28/2016 >60  >60 mL/min Final  . GFR calc Af Amer 06/28/2016 >60  >60 mL/min Final   Comment: (NOTE) The eGFR has been calculated using the CKD EPI equation. This  calculation has not been validated in all clinical situations. eGFR's persistently <60 mL/min signify possible Chronic Kidney Disease.   . Anion gap 06/28/2016 7  5 - 15 Final  . Prothrombin Time 06/28/2016 13.8  11.4 - 15.2 seconds Final  . INR 06/28/2016 1.06   Final  . ABO/RH(D) 06/28/2016 A POS   Final  . Antibody Screen 06/28/2016 NEG   Final  . Sample Expiration 06/28/2016 07/07/2016   Final  . Extend sample reason 06/28/2016 NO  TRANSFUSIONS OR PREGNANCY IN THE PAST 3 MONTHS   Final  . MRSA, PCR 06/28/2016 NEGATIVE  NEGATIVE Final  . Staphylococcus aureus 06/28/2016 NEGATIVE  NEGATIVE Final   Comment:        The Xpert SA Assay (FDA approved for NASAL specimens in patients over 3 years of age), is one component of a comprehensive surveillance program.  Test performance has been validated by [facility] for patients greater than or equal to 93 year old. It is not intended to diagnose infection nor to guide or monitor treatment.   . ABO/RH(D) 06/28/2016 A POS   Final     Hospital Course: John Austin is a 76 y.o. who was admitted to [facility]. They were brought to the operating room on 08/08/2016 and underwent Procedure(s): LEFT TOTAL KNEE ARTHROPLASTY.  Patient tolerated the procedure well and was later transferred to the recovery room and then to the orthopaedic floor for postoperative care.  They were given PO and IV analgesics for pain control following their surgery.  They were given 24 hours of postoperative antibiotics of  Anti-infectives    Start     Dose/Rate Route Frequency Ordered Stop   08/08/16 1730  ceFAZolin (ANCEF) IVPB 2g/100 mL premix     2 g 200 mL/hr over 30 Minutes Intravenous Every 6 hours 08/08/16 1452 08/09/16 0038   08/08/16 0946  ceFAZolin (ANCEF) 2-4 GM/100ML-% IVPB    Comments:  Riling, Sonya   : cabinet override      08/08/16 0946 08/08/16 1135   08/08/16 0939  ceFAZolin (ANCEF) IVPB 2g/100 mL premix     2 g 200 mL/hr over 30  Minutes Intravenous On call to O.R. 08/08/16 [id number] 08/08/16 1135     and started on DVT prophylaxis in the form of Eliquis.   PT and OT were ordered for total joint protocol.  Discharge planning consulted to help with postop disposition and equipment needs.  Patient had a fair night on the evening of surgery.  They started to get up OOB with therapy on day one. Hemovac drain was pulled without difficulty.  Patient had some difficulty with confusion and impulsive behavior the afternoon of post op day one. Narcotics were held and the patient improved. Continued to work with therapy into day two.  Dressing was changed on day two and the incision was clean and dry.  The patient had progressed with therapy and meeting their goals.  Incision was healing well.  Patient was seen in rounds and was ready to go home.   Diet: Cardiac diet Activity:WBAT Follow-up:in 2 weeks Disposition - Home Discharged Condition: stable   Discharge Instructions    Call MD / Call 911    Complete by:  As directed    If you experience chest pain or shortness of breath, CALL 911 and be transported to the hospital emergency room.  If you develope a fever above 101 F, pus (white drainage) or increased drainage or redness at the wound, or calf pain, call your surgeon's office.   Constipation Prevention    Complete by:  As directed    Drink plenty of fluids.  Prune juice may be helpful.  You may use a stool softener, such as Colace (over the counter) 100 mg twice a day.  Use MiraLax (over the counter) for constipation as needed.   Diet - low sodium heart healthy    Complete by:  As directed    Discharge instructions    Complete by:  As directed    Dr. Gaynelle Arabian Total Joint Specialist [facility] [address], [place], [place] [address] [phone]  TOTAL KNEE REPLACEMENT POSTOPERATIVE DIRECTIONS  Knee Rehabilitation, Guidelines Following Surgery  Results after knee surgery are often greatly  improved when you follow the exercise, range of motion and muscle strengthening exercises prescribed by your doctor. Safety measures are also important to protect the knee from further injury. Any time any of these exercises cause you to have increased pain or swelling in your knee joint, decrease the amount until you are comfortable again and slowly increase them. If you have problems or questions, call your caregiver or physical therapist for advice.   HOME CARE INSTRUCTIONS  Remove items at home which could result in a fall. This includes throw rugs or furniture in walking pathways.  ICE to the affected knee every three hours for 30 minutes at a time and then as needed for pain and swelling.  Continue to use ice on the knee for pain and swelling from surgery. You may notice swelling that will progress down to the foot and ankle.  This is normal after surgery.  Elevate the leg when you are not up walking on it.   Continue to use the breathing machine which will help keep your temperature down.  It is common for your temperature to cycle up and down following surgery, especially at night when you are not up moving around and exerting yourself.  The breathing machine keeps your lungs expanded and your temperature down. Do not place pillow under knee, focus on keeping the knee straight while resting  DIET You may resume your previous home diet once your are discharged from the hospital.  DRESSING / WOUND CARE / SHOWERING You may start showering once you are discharged home but do not submerge the incision under water. Just pat the incision dry and apply a dry gauze dressing on daily. Change the surgical dressing daily and reapply a dry dressing each time.  ACTIVITY Walk with your walker as instructed. Use walker as long as suggested by your caregivers. Avoid periods of inactivity such as sitting longer than an hour when not asleep. This helps prevent blood clots.  You may resume a sexual  relationship in one month or when given the OK by your doctor.  You may return to work once you are cleared by your doctor.  Do not drive a car for 6 weeks or until released by you surgeon.  Do not drive while taking narcotics.  WEIGHT BEARING Weight bearing as tolerated with assist device (walker, cane, etc) as directed, use it as long as suggested by your surgeon or therapist, typically at least 4-6 weeks.  POSTOPERATIVE CONSTIPATION PROTOCOL Constipation - defined medically as fewer than three stools per week and severe constipation as less than one stool per week.  One of the most common issues patients have following surgery is constipation.  Even if you have a regular bowel pattern at home, your normal regimen is likely to be disrupted due to multiple reasons following surgery.  Combination of anesthesia, postoperative narcotics, change in appetite and fluid intake all can affect your bowels.  In order to avoid complications following surgery, here are some recommendations in order to help you during your recovery period.  Colace (docusate) - Pick up an over-the-counter form of Colace or another stool softener and take twice a day as long as you are requiring postoperative pain medications.  Take with a full glass  of water daily.  If you experience loose stools or diarrhea, hold the colace until you stool forms back up.  If your symptoms do not get better within 1 week or if they get worse, check with your doctor.  Dulcolax (bisacodyl) - Pick up over-the-counter and take as directed by the product packaging as needed to assist with the movement of your bowels.  Take with a full glass of water.  Use this product as needed if not relieved by Colace only.   MiraLax (polyethylene glycol) - Pick up over-the-counter to have on hand.  MiraLax is a solution that will increase the amount of water in your bowels to assist with bowel movements.  Take as directed and can mix with a glass of water, juice,  soda, coffee, or tea.  Take if you go more than two days without a movement. Do not use MiraLax more than once per day. Call your doctor if you are still constipated or irregular after using this medication for 7 days in a row.  If you continue to have problems with postoperative constipation, please contact the office for further assistance and recommendations.  If you experience "the worst abdominal pain ever" or develop nausea or vomiting, please contact the office immediatly for further recommendations for treatment.  ITCHING  If you experience itching with your medications, try taking only a single pain pill, or even half a pain pill at a time.  You can also use Benadryl over the counter for itching or also to help with sleep.   TED HOSE STOCKINGS Wear the elastic stockings on both legs for three weeks following surgery during the day but you may remove then at night for sleeping.  MEDICATIONS See your medication summary on the "After Visit Summary" that the nursing staff will review with you prior to discharge.  You may have some home medications which will be placed on hold until you complete the course of blood thinner medication.  It is important for you to complete the blood thinner medication as prescribed by your surgeon.  Continue your approved medications as instructed at time of discharge.  PRECAUTIONS If you experience chest pain or shortness of breath - call 911 immediately for transfer to the hospital emergency department.  If you develop a fever greater that 101 F, purulent drainage from wound, increased redness or drainage from wound, foul odor from the wound/dressing, or calf pain - CONTACT YOUR SURGEON.                                                   FOLLOW-UP APPOINTMENTS Make sure you keep all of your appointments after your operation with your surgeon and caregivers. You should call the office at the above phone number and make an appointment for approximately two weeks  after the date of your surgery or on the date instructed by your surgeon outlined in the "After Visit Summary".   RANGE OF MOTION AND STRENGTHENING EXERCISES  Rehabilitation of the knee is important following a knee injury or an operation. After just a few days of immobilization, the muscles of the thigh which control the knee become weakened and shrink (atrophy). Knee exercises are designed to build up the tone and strength of the thigh muscles and to improve knee motion. Often times heat used for twenty to thirty minutes before working out  will loosen up your tissues and help with improving the range of motion but do not use heat for the first two weeks following surgery. These exercises can be done on a training (exercise) mat, on the floor, on a table or on a bed. Use what ever works the best and is most comfortable for you Knee exercises include:  Leg Lifts - While your knee is still immobilized in a splint or cast, you can do straight leg raises. Lift the leg to 60 degrees, hold for 3 sec, and slowly lower the leg. Repeat 10-20 times 2-3 times daily. Perform this exercise against resistance later as your knee gets better.  Quad and Hamstring Sets - Tighten up the muscle on the front of the thigh (Quad) and hold for 5-10 sec. Repeat this 10-20 times hourly. Hamstring sets are done by pushing the foot backward against an object and holding for 5-10 sec. Repeat as with quad sets.  Leg Slides: Lying on your back, slowly slide your foot toward your buttocks, bending your knee up off the floor (only go as far as is comfortable). Then slowly slide your foot back down until your leg is flat on the floor again. Angel Wings: Lying on your back spread your legs to the side as far apart as you can without causing discomfort.  A rehabilitation program following serious knee injuries can speed recovery and prevent re-injury in the future due to weakened muscles. Contact your doctor or a physical therapist for more  information on knee rehabilitation.   IF YOU ARE TRANSFERRED TO A SKILLED REHAB FACILITY If the patient is transferred to a skilled rehab facility following release from the hospital, a list of the current medications will be sent to the facility for the patient to continue.  When discharged from the skilled rehab facility, please have the facility set up the patient's [place] prior to being released. Also, the skilled facility will be responsible for providing the patient with their medications at time of release from the facility to include their pain medication, the muscle relaxants, and their blood thinner medication. If the patient is still at the rehab facility at time of the two week follow up appointment, the skilled rehab facility will also need to assist the patient in arranging follow up appointment in our office and any transportation needs.  MAKE SURE YOU:  Understand these instructions.  Get help right away if you are not doing well or get worse.    Pick up stool softner and laxative for home use following surgery while on pain medications. Do not submerge incision under water. Please use good hand washing techniques while changing dressing each day. May shower starting three days after surgery. Please use a clean towel to pat the incision dry following showers. Continue to use ice for pain and swelling after surgery. Do not use any lotions or creams on the incision until instructed by your surgeon.  INSTRUCTIONS AFTER JOINT REPLACEMENT   Remove items at home which could result in a fall. This includes throw rugs or furniture in walking pathways ICE to the affected joint every three hours while awake for 30 minutes at a time, for at least the first 3-5 days, and then as needed for pain and swelling.  Continue to use ice for pain and swelling. You may notice swelling that will progress down to the foot and ankle.  This is normal after surgery.  Elevate your leg  when you are not up  walking on it.   Continue to use the breathing machine you got in the hospital (incentive spirometer) which will help keep your temperature down.  It is common for your temperature to cycle up and down following surgery, especially at night when you are not up moving around and exerting yourself.  The breathing machine keeps your lungs expanded and your temperature down.   DIET:  As you were doing prior to hospitalization, we recommend a well-balanced diet.  DRESSING / WOUND CARE / SHOWERING  You may change your dressing every day with sterile gauze.  Please use good hand washing techniques before changing the dressing.  Do not use any lotions or creams on the incision until instructed by your surgeon.  ACTIVITY  Increase activity slowly as tolerated, but follow the weight bearing instructions below.   No driving for 6 weeks or until further direction given by your physician.  You cannot drive while taking narcotics.  No lifting or carrying greater than 10 lbs. until further directed by your surgeon. Avoid periods of inactivity such as sitting longer than an hour when not asleep. This helps prevent blood clots.  You may return to work once you are authorized by your doctor.     WEIGHT BEARING   Weight bearing as tolerated with assist device (walker, cane, etc) as directed, use it as long as suggested by your surgeon or therapist, typically at least 4-6 weeks.   EXERCISES  Results after joint replacement surgery are often greatly improved when you follow the exercise, range of motion and muscle strengthening exercises prescribed by your doctor. Safety measures are also important to protect the joint from further injury. Any time any of these exercises cause you to have increased pain or swelling, decrease what you are doing until you are comfortable again and then slowly increase them. If you have problems or questions, call your caregiver or physical therapist for  advice.   Rehabilitation is important following a joint replacement. After just a few days of immobilization, the muscles of the leg can become weakened and shrink (atrophy).  These exercises are designed to build up the tone and strength of the thigh and leg muscles and to improve motion. Often times heat used for twenty to thirty minutes before working out will loosen up your tissues and help with improving the range of motion but do not use heat for the first two weeks following surgery (sometimes heat can increase post-operative swelling).   These exercises can be done on a training (exercise) mat, on the floor, on a table or on a bed. Use whatever works the best and is most comfortable for you.    Use music or television while you are exercising so that the exercises are a pleasant break in your day. This will make your life better with the exercises acting as a break in your routine that you can look forward to.   Perform all exercises about fifteen times, three times per day or as directed.  You should exercise both the operative leg and the other leg as well.  Exercises include:   Quad Sets - Tighten up the muscle on the front of the thigh (Quad) and hold for 5-10 seconds.   Straight Leg Raises - With your knee straight (if you were given a brace, keep it on), lift the leg to 60 degrees, hold for 3 seconds, and slowly lower the leg.  Perform this exercise against resistance later as your leg gets stronger.  Leg  Slides: Lying on your back, slowly slide your foot toward your buttocks, bending your knee up off the floor (only go as far as is comfortable). Then slowly slide your foot back down until your leg is flat on the floor again.  Angel Wings: Lying on your back spread your legs to the side as far apart as you can without causing discomfort.  Hamstring Strength:  Lying on your back, push your heel against the floor with your leg straight by tightening up the muscles of your buttocks.  Repeat,  but this time bend your knee to a comfortable angle, and push your heel against the floor.  You may put a pillow under the heel to make it more comfortable if necessary.   A rehabilitation program following joint replacement surgery can speed recovery and prevent re-injury in the future due to weakened muscles. Contact your doctor or a physical therapist for more information on knee rehabilitation.    CONSTIPATION  Constipation is defined medically as fewer than three stools per week and severe constipation as less than one stool per week.  Even if you have a regular bowel pattern at home, your normal regimen is likely to be disrupted due to multiple reasons following surgery.  Combination of anesthesia, postoperative narcotics, change in appetite and fluid intake all can affect your bowels.   YOU MUST use at least one of the following options; they are listed in order of increasing strength to get the job done.  They are all available over the counter, and you may need to use some, POSSIBLY even all of these options:    Drink plenty of fluids (prune juice may be helpful) and high fiber foods Colace 100 mg by mouth twice a day  Senokot for constipation as directed and as needed Dulcolax (bisacodyl), take with full glass of water  Miralax (polyethylene glycol) once or twice a day as needed.  If you have tried all these things and are unable to have a bowel movement in the first 3-4 days after surgery call either your surgeon or your primary doctor.    If you experience loose stools or diarrhea, hold the medications until you stool forms back up.  If your symptoms do not get better within 1 week or if they get worse, check with your doctor.  If you experience "the worst abdominal pain ever" or develop nausea or vomiting, please contact the office immediately for further recommendations for treatment.   ITCHING:  If you experience itching with your medications, try taking only a single pain pill,  or even half a pain pill at a time.  You can also use Benadryl over the counter for itching or also to help with sleep.   TED HOSE STOCKINGS:  Use stockings on both legs until for at least 2 weeks or as directed by physician office. They may be removed at night for sleeping.  MEDICATIONS:  See your medication summary on the "After Visit Summary" that nursing will review with you.  You may have some home medications which will be placed on hold until you complete the course of blood thinner medication.  It is important for you to complete the blood thinner medication as prescribed.  PRECAUTIONS:  If you experience chest pain or shortness of breath - call 911 immediately for transfer to the hospital emergency department.   If you develop a fever greater that 101 F, purulent drainage from wound, increased redness or drainage from wound, foul odor from the wound/dressing,  or calf pain - CONTACT YOUR SURGEON.                                                   FOLLOW-UP APPOINTMENTS:  If you do not already have a post-op appointment, please call the office for an appointment to be seen by your surgeon.  Guidelines for how soon to be seen are listed in your "After Visit Summary", but are typically between 1-4 weeks after surgery.  OTHER INSTRUCTIONS:   Knee Replacement:  Do not place pillow under knee, focus on keeping the knee straight while resting. CPM instructions: 0-90 degrees, 2 hours in the morning, 2 hours in the afternoon, and 2 hours in the evening. Place foam block, curve side up under heel at all times except when in CPM or when walking.  DO NOT modify, tear, cut, or change the foam block in any way.  MAKE SURE YOU:  Understand these instructions.  Get help right away if you are not doing well or get worse.    Thank you for letting us be a part of your medical care team.  It is a privilege we respect greatly.  We hope these instructions will help you stay on track for a fast and full  recovery!   Increase activity slowly as tolerated    Complete by:  As directed      Allergies as of 08/10/2016   No Active Allergies     Medication List    STOP taking these medications   aspirin EC 81 MG tablet   b complex vitamins tablet   celecoxib 200 MG capsule Commonly known as:  CELEBREX   Cinnamon 500 MG Tabs   Fish Oil 1000 MG Caps   multivitamin with minerals tablet   Turmeric 500 MG Caps   Vitamin D (Ergocalciferol) 50000 units Caps capsule Commonly known as:  DRISDOL     TAKE these medications   acetaminophen 500 MG tablet Commonly known as:  TYLENOL Take 500 mg by mouth every 6 (six) hours as needed for mild pain or headache.   apixaban 2.5 MG Tabs tablet Commonly known as:  ELIQUIS Take 1 tablet (2.5 mg total) by mouth every 12 (twelve) hours.   diltiazem 120 MG 24 hr capsule Commonly known as:  CARDIZEM CD Take 120 mg by mouth at bedtime.   docusate sodium 100 MG capsule Commonly known as:  COLACE Take 200 mg by mouth at bedtime.   doxycycline 50 MG tablet Commonly known as:  ADOXA Take 50 mg by mouth 2 (two) times daily as needed (Rosacea).   fexofenadine 180 MG tablet Commonly known as:  ALLEGRA Take 180 mg by mouth daily as needed for allergies.   fluticasone 50 MCG/ACT nasal spray Commonly known as:  FLONASE Place 2 sprays into both nostrils daily as needed for allergies. Allergies.   meclizine 25 MG tablet Commonly known as:  ANTIVERT Take 12.5-25 mg by mouth daily as needed. dizziness   methocarbamol 500 MG tablet Commonly known as:  ROBAXIN Take 1 tablet (500 mg total) by mouth every 6 (six) hours as needed for muscle spasms.   metroNIDAZOLE 0.75 % cream Commonly known as:  METROCREAM Apply 1 application topically daily as needed.   traMADol 50 MG tablet Commonly known as:  ULTRAM Take 1-2 tablets (50-100 mg total) by mouth every  6 (six) hours as needed for moderate pain.   triamcinolone cream 0.1 % Commonly known as:   KENALOG Apply 1 application topically daily as needed.      Follow-up Information    [name], MD. Schedule an appointment as soon as possible for a visit on 08/23/2016.   Specialty:  Orthopedic Surgery Why:  Call [phone] tomorrow to [facility] the appointment Contact information: [address] [place] [address] [phone]           Signed: Ardeen Jourdain, PA-C Orthopaedic Surgery 08/15/2016, 7:27 AM

## 2016-08-15 NOTE — Therapy (Signed)
[facility] [place], [place], [address] Phone: [phone]   Fax:  [phone]  Physical Therapy Treatment  Patient Details  [name] MRN: [id number] Date of Birth: [birth year] Referring Provider: Gaynelle Arabian MD  Encounter Date: 08/15/2016      PT End of Session - 08/15/16 1422    Visit Number 2   Number of Visits 12   Date for PT Re-Evaluation 10/11/16   PT Start Time 1025   PT Stop Time 1517   PT Time Calculation (min) 57 min   Activity Tolerance Patient tolerated treatment well   Behavior During Therapy [facility] for tasks assessed/performed      Past Medical History:  Diagnosis Date  . Arthritis   . Cancer (HCC)    hx of skin cancer   . Complication of anesthesia   . Essential hypertension   . GERD (gastroesophageal reflux disease)   . History of hiatal hernia   . PONV (postoperative nausea and vomiting)    per pt "slow to wake, difficult to put to sleep"     Past Surgical History:  Procedure Laterality Date  . BILATERAL SHOULDER SURGERY    . COLONOSCOPY    . EYE SURGERY     cataract extractions   . eyebrow surgery     . RIGHT FOOT SURGERY    . 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      Subjective Assessment - 08/15/16 1421    Subjective States that today his knee is more painful for unknown reason. Patient states that he has been using exercises that he was provided in evaluation at home.   Patient Stated Goals Get out of pain and back to normal.   Currently in Pain? Yes   Pain Score 4    Pain Location Knee   Pain Orientation Left   Pain Descriptors / Indicators Discomfort   Pain Type Surgical pain   Pain Onset 1 to 4 weeks ago            [facility] PT Assessment - 08/15/16  0001      Assessment   Medical Diagnosis Left total knee replacement.   Onset Date/Surgical Date 08/08/16     Restrictions   Weight Bearing Restrictions No     ROM / Strength   AROM / PROM / Strength AROM     AROM   Overall AROM  Deficits   AROM Assessment Site Knee   Right/Left Knee Left   Left Knee Extension -8   Left Knee Flexion 108                     [facility] Adult PT Treatment/Exercise - 08/15/16 0001      Knee/Hip Exercises: Aerobic   Nustep L4, seat 11-10 x10 min     Knee/Hip Exercises: Standing   Hip Flexion AROM;Left;2 sets;10 reps;Knee bent   Hip Abduction AROM;Left;2 sets;10 reps;Knee straight   Rocker Board 3 minutes     Knee/Hip Exercises: Seated   Long Arc Quad AROM;Left;2 sets;10 reps     Modalities   Modalities Passenger transport manager Location L knee   Electrical Stimulation Action IFC   Electrical Stimulation Parameters 1-10 hz x15 min  Electrical Stimulation Goals Edema;Pain     Vasopneumatic   Number Minutes Vasopneumatic  15 minutes   Vasopnuematic Location  Knee   Vasopneumatic Pressure Medium   Vasopneumatic Temperature  50     Manual Therapy   Manual Therapy Passive ROM;Soft tissue mobilization   Soft tissue mobilization R patellar mobilizations in med/lat, sup/inf to promote proper mobility   Passive ROM PROM of L knee flex/ext with gentle holds at end range                  PT Short Term Goals - 08/12/16 1319      PT SHORT TERM GOAL #1   Title Full active left knee extension in order to normalize gait.           PT Long Term Goals - 08/12/16 1321      PT LONG TERM GOAL #1   Title Independent with a HEP.   Time 8   Period Weeks   Status New     PT LONG TERM GOAL #2   Title Active right knee flexion to 115 degrees+ so the patient can perform functional tasks and do so with pain not > 2-3/10.   Time 8   Period Weeks   Status New      PT LONG TERM GOAL #3   Title Increase knee strength to a solid 5/5 to provide good stability for accomplishment of functional activities   Time 8   Period Weeks   Status New     PT LONG TERM GOAL #4   Title Perform a reciprocating stair gait with one railing with pain not > 2-3/10.   Time 8   Period Weeks   Status New     PT LONG TERM GOAL #5   Title Perform ADL's with pain not > 3/10.   Time 8   Period Weeks   Status New               Plan - 08/15/16 1503    Clinical Impression Statement Patient tolerated today's treatment well although he arrived with 4/10 L knee pain. Patient guided through L knee stretches and ROM exercises without complications or complaint of pain. Patient continues to present with steristrips and gauze strip over L knee incision. Good L patella mobility in all directions assessed with edema present surrounding L patella. AROM of L knee measured as 8-108 deg. Normal modalities response noted following removal of the modalities.   Rehab Potential Excellent   PT Frequency 3x / week   PT Duration 4 weeks   PT Treatment/Interventions ADLs/Self Care Home Management;Cryotherapy;Occupational psychologist;Therapeutic activities;Therapeutic exercise;Neuromuscular re-education;Patient/family education;Manual techniques;Passive range of motion;Vasopneumatic Device   PT Next Visit Plan Continue with TKR protocol for L knee with modalities for pain/edema per MPT POC.   Consulted and Agree with Plan of Care Patient      Patient will benefit from skilled therapeutic intervention in order to improve the following deficits and impairments:  Pain, Increased edema, Decreased strength, Decreased range of motion, Abnormal gait  Visit Diagnosis: Chronic pain of left knee  Stiffness of left knee, not elsewhere classified  Localized edema     Problem List Patient Active Problem List   Diagnosis Date Noted  . OA (osteoarthritis) of knee  08/08/2016  . Leukocytosis 04/29/2015  . Abdominal pain, left lower quadrant 04/29/2015  . Acute diverticulitis 04/27/2015  . Essential hypertension 04/27/2015    Wynelle Fanny, PTA 08/15/2016, 3:21 PM  [facility] [place], [place], [address] Phone: [phone]   Fax:  [phone]  Name: MIKLOS BIDINGER MRN: [id number] Date of Birth: 1940-05-11

## 2016-08-16 ENCOUNTER — Ambulatory Visit: Payer: Medicare Other | Admitting: Physical Therapy

## 2016-08-16 DIAGNOSIS — G8929 Other chronic pain: Secondary | ICD-10-CM | POA: Diagnosis not present

## 2016-08-16 DIAGNOSIS — R600 Localized edema: Secondary | ICD-10-CM | POA: Diagnosis not present

## 2016-08-16 DIAGNOSIS — M25562 Pain in left knee: Secondary | ICD-10-CM | POA: Diagnosis not present

## 2016-08-16 DIAGNOSIS — M25662 Stiffness of left knee, not elsewhere classified: Secondary | ICD-10-CM | POA: Diagnosis not present

## 2016-08-17 NOTE — Therapy (Signed)
[facility] [place], [place], [address] Phone: [phone]   Fax:  [phone]  Physical Therapy Treatment  Patient Details  [name] MRN: [id number] Date of Birth: 07-02-1940 Referring Provider: Gaynelle Arabian MD  Encounter Date: 08/16/2016      PT End of Session - 08/16/16 1506    Visit Number 3   Number of Visits 12   Date for PT Re-Evaluation 10/11/16   PT Start Time 0230      Past Medical History:  Diagnosis Date  . Arthritis   . Cancer (HCC)    hx of skin cancer   . Complication of anesthesia   . Essential hypertension   . GERD (gastroesophageal reflux disease)   . History of hiatal hernia   . PONV (postoperative nausea and vomiting)    per pt "slow to wake, difficult to put to sleep"     Past Surgical History:  Procedure Laterality Date  . BILATERAL SHOULDER SURGERY    . COLONOSCOPY    . EYE SURGERY     cataract extractions   . eyebrow surgery     . RIGHT FOOT SURGERY    . 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      Subjective Assessment - 08/16/16 1507    Subjective I'm doing better today.   Patient Stated Goals Get out of pain and back to normal.   Pain Score 3    Pain Location Knee   Pain Orientation Left   Pain Descriptors / Indicators Discomfort   Pain Type Surgical pain   Pain Onset 1 to 4 weeks ago                                   PT Short Term Goals - 08/12/16 1319      PT SHORT TERM GOAL #1   Title Full active left knee extension in order to normalize gait.           PT Long Term Goals - 08/12/16 1321      PT LONG TERM GOAL #1   Title Independent with a HEP.   Time 8   Period Weeks   Status New     PT LONG TERM GOAL #2    Title Active right knee flexion to 115 degrees+ so the patient can perform functional tasks and do so with pain not > 2-3/10.   Time 8   Period Weeks   Status New     PT LONG TERM GOAL #3   Title Increase knee strength to a solid 5/5 to provide good stability for accomplishment of functional activities   Time 8   Period Weeks   Status New     PT LONG TERM GOAL #4   Title Perform a reciprocating stair gait with one railing with pain not > 2-3/10.   Time 8   Period Weeks   Status New     PT LONG TERM GOAL #5   Title Perform ADL's with pain not > 3/10.   Time 8   Period Weeks   Status New       level 4 x 15 minutes moving forward x 2 to increase flexi-  on.            Knee/Hip Exercises: Supine  Short Arc Quad Sets Limitations  SAQ's x 15 min- utes facilitat- ed with VMS with 10 sec extension holds and 10 sec rest          Modalities  Modalities  Electrical Sti- mulation;Vasop- neumatic          Electrical Stimulation  Electrical Stimulation Location  Left knee.          Electrical Stimulation Action  IFC          Electrical Stimulation Parameters  1-10 Hz x 15 minutes.          Electrical Stimulation Goals  Edema;Pain          Vasopneumatic  Number Minutes Vasopneumatic   15 minutes          Vasopnuematic Location   -            Left knee.          Vasopneumatic Pressure  Medium                   Patient will benefit from skilled therapeutic intervention in order to improve the following deficits and impairments:     Visit Diagnosis: Chronic pain of left knee  Stiffness of left knee, not elsewhere classified  Localized edema     Problem List Patient Active Problem List   Diagnosis Date Noted  . OA (osteoarthritis) of knee 08/08/2016  . Leukocytosis 04/29/2015  . Abdominal pain, left lower quadrant 04/29/2015  . Acute diverticulitis 04/27/2015  . Essential hypertension 04/27/2015    Phi Avans, [place] MPT 08/17/2016, 12:15 PM  [facility] [address] [place], [place], [address] Phone: [phone]   Fax:  [phone]  [name] MRN: [id number] Date of Birth: 03-04-1941

## 2016-08-18 ENCOUNTER — Encounter: Payer: Self-pay | Admitting: Physical Therapy

## 2016-08-18 ENCOUNTER — Ambulatory Visit: Payer: Medicare Other | Admitting: Physical Therapy

## 2016-08-18 DIAGNOSIS — R600 Localized edema: Secondary | ICD-10-CM

## 2016-08-18 DIAGNOSIS — M25562 Pain in left knee: Principal | ICD-10-CM

## 2016-08-18 DIAGNOSIS — M25662 Stiffness of left knee, not elsewhere classified: Secondary | ICD-10-CM

## 2016-08-18 DIAGNOSIS — G8929 Other chronic pain: Secondary | ICD-10-CM | POA: Diagnosis not present

## 2016-08-18 NOTE — Patient Instructions (Addendum)
Strengthening: Terminal Knee Extension (Supine)    With left knee over bolster, straighten knee by tightening muscles on top of thigh. Keep bottom of knee on bolster. Hold for 5 seconds. Repeat _2___ times per set. Do __10__ sets per session. Do __2-3__ sessions per day.  http://orth.exer.us/626   Copyright  VHI. All rights reserved.  Stretching: Hamstring (Standing)    Place left foot on stool. Slowly lean forward, keeping back straight, until stretch is felt in back of thigh. Hold __30__ seconds. Repeat __3__ times per set. Do ____ sets per session. Do __3-4__ sessions per day.  http://orth.exer.us/658   Copyright  VHI. All rights reserved.

## 2016-08-18 NOTE — Therapy (Signed)
[facility] [place], [place], [address] Phone: [phone]   Fax:  [phone]  Physical Therapy Treatment  Patient Details  [name] MRN: [id number] Date of Birth: 1940-10-18 Referring Provider: Gaynelle Arabian MD  Encounter Date: 08/18/2016      PT End of Session - 08/18/16 1528    Visit Number 4   Number of Visits 12   Date for PT Re-Evaluation 10/11/16   PT Start Time 9211   PT Stop Time 9417   PT Time Calculation (min) 57 min   Activity Tolerance Patient tolerated treatment well   Behavior During Therapy [facility] for tasks assessed/performed      Past Medical History:  Diagnosis Date  . Arthritis   . Cancer (HCC)    hx of skin cancer   . Complication of anesthesia   . Essential hypertension   . GERD (gastroesophageal reflux disease)   . History of hiatal hernia   . PONV (postoperative nausea and vomiting)    per pt "slow to wake, difficult to put to sleep"     Past Surgical History:  Procedure Laterality Date  . BILATERAL SHOULDER SURGERY    . COLONOSCOPY    . EYE SURGERY     cataract extractions   . eyebrow surgery     . RIGHT FOOT SURGERY    . 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      Subjective Assessment - 08/18/16 1517    Subjective Reports that today is a tough day for his knee "but it could be worse."    Patient Stated Goals Get out of pain and back to normal.   Currently in Pain? Yes   Pain Score 4    Pain Location Knee   Pain Orientation Left   Pain Descriptors / Indicators Discomfort   Pain Type Surgical pain   Pain Onset 1 to 4 weeks ago   Pain Frequency Intermittent            [facility] PT Assessment - 08/18/16 0001      Assessment   Medical Diagnosis Left  total knee replacement.   Onset Date/Surgical Date 08/08/16   Next MD Visit 08/23/2016     Restrictions   Weight Bearing Restrictions No     ROM / Strength   AROM / PROM / Strength AROM     AROM   Overall AROM  Deficits   AROM Assessment Site Knee   Right/Left Knee Left   Left Knee Extension -7   Left Knee Flexion 117                     [facility] Adult PT Treatment/Exercise - 08/18/16 0001      Knee/Hip Exercises: Stretches   Active Hamstring Stretch Left;3 reps;30 seconds     Knee/Hip Exercises: Aerobic   Nustep L4 x14 min at seat 10     Knee/Hip Exercises: Standing   Hip Flexion AROM;Left;2 sets;10 reps;Knee bent   Forward Lunges Left;2 sets;10 reps;3 seconds   Hip Abduction AROM;Left;2 sets;10 reps;Knee straight   Rocker Board 3 minutes     Knee/Hip Exercises: Supine   Short Arc Quad Sets Strengthening;Left;2 sets;10 reps   Short Arc Quad Sets Limitations 5 sec hold with 2#  Modalities   Modalities Passenger transport manager Location L knee   Electrical Stimulation Action IFC   Electrical Stimulation Parameters 1-10 hz x15 min   Electrical Stimulation Goals Edema;Pain     Vasopneumatic   Number Minutes Vasopneumatic  15 minutes   Vasopnuematic Location  Knee   Vasopneumatic Pressure Medium   Vasopneumatic Temperature  34     Manual Therapy   Manual Therapy Passive ROM   Passive ROM PROM of L knee flex/ext with gentle holds at end range                PT Education - 08/18/16 1605    Education provided Yes   Education Details HEP- HS stretch, SAQ   Person(s) Educated Patient   Methods Explanation;Demonstration;Verbal cues;Handout   Comprehension Verbalized understanding;Returned demonstration;Verbal cues required          PT Short Term Goals - 08/12/16 1319      PT SHORT TERM GOAL #1   Title Full active left knee extension in order to normalize gait.            PT Long Term Goals - 08/18/16 1602      PT LONG TERM GOAL #1   Title Independent with a HEP.   Time 8   Period Weeks   Status On-going     PT LONG TERM GOAL #2   Title Active right knee flexion to 115 degrees+ so the patient can perform functional tasks and do so with pain not > 2-3/10.   Time 8   Period Weeks   Status Achieved  AROM R knee 115 deg flexion 08/18/2016     PT LONG TERM GOAL #3   Title Increase knee strength to a solid 5/5 to provide good stability for accomplishment of functional activities   Time 8   Period Weeks   Status On-going     PT LONG TERM GOAL #4   Title Perform a reciprocating stair gait with one railing with pain not > 2-3/10.   Time 8   Period Weeks   Status On-going     PT LONG TERM GOAL #5   Title Perform ADL's with pain not > 3/10.   Time 8   Period Weeks   Status On-going               Plan - 08/18/16 1625    Clinical Impression Statement Patient tolerated today's treatment well although he reported increased pain upon arrival to gym.  Patient guided through initial TKR protocol exercises for stretching and ROM without complaint. Good L quad contraction noted with SAQ with 2# ankle weight. Good ROM noted with PROM today in supine with edema observed as well as beginning of steristrip detachment. AROM L knee measured as 7-117 deg. Normal modalities response noted following removal of the modalities. Patient provided new HEP to be added to other exercises provided in PT. Patient educated that he could start using 2# that he has at home with SAQ.   Rehab Potential Excellent   PT Frequency 3x / week   PT Duration 4 weeks   PT Treatment/Interventions ADLs/Self Care Home Management;Cryotherapy;Occupational psychologist;Therapeutic activities;Therapeutic exercise;Neuromuscular re-education;Patient/family education;Manual techniques;Passive range of motion;Vasopneumatic Device   PT Next Visit Plan Continue with TKR  protocol for L knee with modalities for pain/edema per MPT POC.   Consulted and Agree with Plan of Care Patient      Patient will benefit from  skilled therapeutic intervention in order to improve the following deficits and impairments:  Pain, Increased edema, Decreased strength, Decreased range of motion, Abnormal gait  Visit Diagnosis: Chronic pain of left knee  Stiffness of left knee, not elsewhere classified  Localized edema     Problem List Patient Active Problem List   Diagnosis Date Noted  . OA (osteoarthritis) of knee 08/08/2016  . Leukocytosis 04/29/2015  . Abdominal pain, left lower quadrant 04/29/2015  . Acute diverticulitis 04/27/2015  . Essential hypertension 04/27/2015    Wynelle Fanny, PTA 08/18/2016, 4:34 PM  [facility] [address] [place], [place], [address] Phone: [phone]   Fax:  [phone]  Name: ADRIENNE DELAY MRN: [id number] Date of Birth: [birth year]

## 2016-08-22 ENCOUNTER — Encounter: Payer: Self-pay | Admitting: Physical Therapy

## 2016-08-22 ENCOUNTER — Ambulatory Visit: Payer: Medicare Other | Admitting: Physical Therapy

## 2016-08-22 DIAGNOSIS — G8929 Other chronic pain: Secondary | ICD-10-CM

## 2016-08-22 DIAGNOSIS — M25662 Stiffness of left knee, not elsewhere classified: Secondary | ICD-10-CM

## 2016-08-22 DIAGNOSIS — R600 Localized edema: Secondary | ICD-10-CM | POA: Diagnosis not present

## 2016-08-22 DIAGNOSIS — M25562 Pain in left knee: Principal | ICD-10-CM

## 2016-08-22 NOTE — Therapy (Signed)
[facility] [place], [place], [address] Phone: [phone]   Fax:  [phone]  Physical Therapy Treatment  Patient Details  [name] MRN: [id number] Date of Birth: 1941/01/23 Referring Provider: Gaynelle Arabian MD  Encounter Date: 08/22/2016      PT End of Session - 08/22/16 1430    Visit Number 5   Number of Visits 12   Date for PT Re-Evaluation 10/11/16   PT Start Time 3825   PT Stop Time 1529   PT Time Calculation (min) 55 min   Activity Tolerance Patient tolerated treatment well   Behavior During Therapy [facility] for tasks assessed/performed      Past Medical History:  Diagnosis Date  . Arthritis   . Cancer (HCC)    hx of skin cancer   . Complication of anesthesia   . Essential hypertension   . GERD (gastroesophageal reflux disease)   . History of hiatal hernia   . PONV (postoperative nausea and vomiting)    per pt "slow to wake, difficult to put to sleep"     Past Surgical History:  Procedure Laterality Date  . BILATERAL SHOULDER SURGERY    . COLONOSCOPY    . EYE SURGERY     cataract extractions   . eyebrow surgery     . RIGHT FOOT SURGERY    . 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      Subjective Assessment - 08/22/16 1430    Subjective Reports increased pain for unknown reason.   Patient Stated Goals Get out of pain and back to normal.   Currently in Pain? Yes   Pain Score 2    Pain Location Knee   Pain Orientation Left   Pain Descriptors / Indicators Discomfort   Pain Type Surgical pain   Pain Onset 1 to 4 weeks ago   Pain Frequency Intermittent   Aggravating Factors  Movement   Pain Relieving Factors Rest            [facility] PT Assessment - 08/22/16 0001      Assessment   Medical Diagnosis Left total knee replacement.   Onset Date/Surgical Date 08/08/16   Next MD Visit 08/23/2016     Restrictions   Weight Bearing Restrictions No     ROM / Strength   AROM / PROM / Strength AROM     AROM   Overall AROM  Within functional limits for tasks performed;Deficits   AROM Assessment Site Knee   Right/Left Knee Left   Left Knee Extension -7   Left Knee Flexion 120                     [facility] Adult PT Treatment/Exercise - 08/22/16 0001      Knee/Hip Exercises: Aerobic   Nustep L4 ,seat 11 x12 min     Knee/Hip Exercises: Standing   Hip Flexion AROM;Left;2 sets;10 reps;Knee bent   Forward Lunges Left;2 sets;10 reps;3 seconds   Hip Abduction AROM;Left;2 sets;10 reps;Knee straight   Rocker Board 2 minutes     Knee/Hip Exercises: Supine   Short Arc Quad Sets Strengthening;Left;2 sets;10 reps   Short Arc Quad Sets Limitations 5 sec hold with 2#      Buyer, retail  Acupuncturist Location L knee   Chartered certified accountant IFC   Electrical Stimulation Parameters 1-10 hz x15 min   Electrical Stimulation Goals Edema;Pain     Vasopneumatic   Number Minutes Vasopneumatic  15 minutes   Vasopnuematic Location  Knee   Vasopneumatic Pressure Medium   Vasopneumatic Temperature  52     Manual Therapy   Manual Therapy Passive ROM   Passive ROM PROM of L knee flex/ext with gentle holds at end range                  PT Short Term Goals - 08/12/16 1319      PT SHORT TERM GOAL #1   Title Full active left knee extension in order to normalize gait.           PT Long Term Goals - 08/18/16 1602      PT LONG TERM GOAL #1   Title Independent with a HEP.   Time 8   Period Weeks   Status On-going     PT LONG TERM GOAL #2   Title Active right knee flexion to 115 degrees+ so the patient can perform functional tasks and do so with pain  not > 2-3/10.   Time 8   Period Weeks   Status Achieved  AROM R knee 115 deg flexion 08/18/2016     PT LONG TERM GOAL #3   Title Increase knee strength to a solid 5/5 to provide good stability for accomplishment of functional activities   Time 8   Period Weeks   Status On-going     PT LONG TERM GOAL #4   Title Perform a reciprocating stair gait with one railing with pain not > 2-3/10.   Time 8   Period Weeks   Status On-going     PT LONG TERM GOAL #5   Title Perform ADL's with pain not > 3/10.   Time 8   Period Weeks   Status On-going               Plan - 08/22/16 1515    Clinical Impression Statement Patient tolerated today's treatment well although patient reports increased discomfort for unknown reasons. Patient guided through protocol appropriate exercises with discomfort reported mostly with HS stretch. Good L Quad contraction with 2# ankleweight. AROM L knee measured as 7-120 deg today in supine. Edema noted in R knee and around R ankle today. Patient has arrived to treatment with TED hose donned and with FWW. Normal modalities response noted following removal of the modalities.   Rehab Potential Excellent   PT Frequency 3x / week   PT Duration 4 weeks   PT Treatment/Interventions ADLs/Self Care Home Management;Cryotherapy;Occupational psychologist;Therapeutic activities;Therapeutic exercise;Neuromuscular re-education;Patient/family education;Manual techniques;Passive range of motion;Vasopneumatic Device   PT Next Visit Plan Continue with TKR protocol for L knee with modalities for pain/edema per MPT POC.   Consulted and Agree with Plan of Care Patient      Patient will benefit from skilled therapeutic intervention in order to improve the following deficits and impairments:  Pain, Increased edema, Decreased strength, Decreased range of motion, Abnormal gait  Visit Diagnosis: Chronic pain of left knee  Stiffness of left knee, not  elsewhere classified  Localized edema     Problem List Patient Active Problem List   Diagnosis Date Noted  . OA (osteoarthritis) of knee 08/08/2016  . Leukocytosis 04/29/2015  . Abdominal pain, left lower quadrant 04/29/2015  . Acute diverticulitis  04/27/2015  . Essential hypertension 04/27/2015   ,Ahmed Prima, PTA 08/22/16 4:00 PM Mali Applegate MPT [facility] [address] [place], [place], [address] Phone: [phone]   Fax:  [phone]  [name] MRN: [id number] Date of Birth: March 05, 1941

## 2016-08-23 DIAGNOSIS — Z96652 Presence of left artificial knee joint: Secondary | ICD-10-CM | POA: Diagnosis not present

## 2016-08-23 DIAGNOSIS — Z471 Aftercare following joint replacement surgery: Secondary | ICD-10-CM | POA: Diagnosis not present

## 2016-08-24 ENCOUNTER — Encounter: Payer: Self-pay | Admitting: Physical Therapy

## 2016-08-24 ENCOUNTER — Ambulatory Visit: Payer: Medicare Other | Admitting: Physical Therapy

## 2016-08-24 DIAGNOSIS — M25662 Stiffness of left knee, not elsewhere classified: Secondary | ICD-10-CM

## 2016-08-24 DIAGNOSIS — M25562 Pain in left knee: Secondary | ICD-10-CM | POA: Diagnosis not present

## 2016-08-24 DIAGNOSIS — R600 Localized edema: Secondary | ICD-10-CM | POA: Diagnosis not present

## 2016-08-24 DIAGNOSIS — G8929 Other chronic pain: Secondary | ICD-10-CM | POA: Diagnosis not present

## 2016-08-24 NOTE — Therapy (Signed)
[facility] [place], [place], [address] Phone: [phone]   Fax:  [phone]  Physical Therapy Treatment  Patient Details  [name] MRN: [id number] Date of Birth: April 06, 1941 Referring Provider: Gaynelle Arabian MD  Encounter Date: 08/24/2016      PT End of Session - 08/24/16 1435    Visit Number 6   Number of Visits 12   Date for PT Re-Evaluation 10/11/16   PT Start Time 1430   PT Stop Time 1528   PT Time Calculation (min) 58 min   Activity Tolerance Patient tolerated treatment well   Behavior During Therapy [facility] for tasks assessed/performed      Past Medical History:  Diagnosis Date  . Arthritis   . Cancer (HCC)    hx of skin cancer   . Complication of anesthesia   . Essential hypertension   . GERD (gastroesophageal reflux disease)   . History of hiatal hernia   . PONV (postoperative nausea and vomiting)    per pt "slow to wake, difficult to put to sleep"     Past Surgical History:  Procedure Laterality Date  . BILATERAL SHOULDER SURGERY    . COLONOSCOPY    . EYE SURGERY     cataract extractions   . eyebrow surgery     . RIGHT FOOT SURGERY    . 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      Subjective Assessment - 08/24/16 1434    Subjective Reports that Dr. Maureen Ralphs said to advance to cane today and patient states that he was very pleased with ROM.   Patient Stated Goals Get out of pain and back to normal.   Currently in Pain? No/denies            [facility] PT Assessment - 08/24/16 0001      Assessment   Medical Diagnosis Left total knee replacement.   Onset Date/Surgical Date 08/08/16   Next MD Visit 09/14/2016     Restrictions   Weight Bearing Restrictions No                      [facility] Adult PT Treatment/Exercise - 08/24/16 0001      Ambulation/Gait   Ambulation/Gait Yes   Ambulation/Gait Assistance 6: Modified independent (Device/Increase time)   Ambulation Distance (Feet) 30 Feet   Assistive device Straight cane   Gait Pattern Step-through pattern;Antalgic   Ambulation Surface Level;Indoor     Knee/Hip Exercises: Aerobic   Nustep L5, seat 12 x12 min     Knee/Hip Exercises: Standing   Hip Flexion AROM;Left;2 sets;10 reps;Knee bent   Forward Lunges Left;2 sets;10 reps;3 seconds   Hip Abduction AROM;Left;2 sets;10 reps;Knee straight   Rocker Board 3 minutes     Knee/Hip Exercises: Supine   Short Arc Quad Sets Strengthening;Left;2 sets;10 reps   Short Arc Quad Sets Limitations 5 sec hold 3#   Straight Leg Raises AROM;Left;2 sets;10 reps     Modalities   Modalities Passenger transport manager Location L knee    Electrical Stimulation Action IFC   Electrical Stimulation Parameters 1-10 hz x15 min   Electrical Stimulation Goals Edema;Pain     Vasopneumatic   Number Minutes Vasopneumatic  15 minutes   Vasopnuematic Location  Knee   Vasopneumatic Pressure Medium   Vasopneumatic Temperature  52     Manual Therapy   Manual Therapy Passive ROM;Soft tissue mobilization   Soft tissue mobilization L patella mobilizations in lat/med, sup/inf to promote proper mobility   Passive ROM PROM of L knee ext with gentle holds at end range                  PT Short Term Goals - 08/12/16 1319      PT SHORT TERM GOAL #1   Title Full active left knee extension in order to normalize gait.           PT Long Term Goals - 08/18/16 1602      PT LONG TERM GOAL #1   Title Independent with a HEP.   Time 8   Period Weeks   Status On-going     PT LONG TERM GOAL #2   Title Active right knee flexion to 115 degrees+ so the patient can perform functional tasks and  do so with pain not > 2-3/10.   Time 8   Period Weeks   Status Achieved  AROM R knee 115 deg flexion 08/18/2016     PT LONG TERM GOAL #3   Title Increase knee strength to a solid 5/5 to provide good stability for accomplishment of functional activities   Time 8   Period Weeks   Status On-going     PT LONG TERM GOAL #4   Title Perform a reciprocating stair gait with one railing with pain not > 2-3/10.   Time 8   Period Weeks   Status On-going     PT LONG TERM GOAL #5   Title Perform ADL's with pain not > 3/10.   Time 8   Period Weeks   Status On-going               Plan - 08/24/16 1525    Clinical Impression Statement Patient tolerated today's treatment very well with only discomfort being with PROM of L knee into extension. Patient guided through protocol appropriate exercises with good form and increased resistance as seen fit. Patient's L knee incision now visible as most steristrips have been removed at this time with good healing observed. Edema still present along L ankle and around L knee which may be limiting patellar mobility in sup/inf directions. Patient educated regarding gait sequence and technique with SPC to which patient felt comfortable quickly with step through technique. Normal modalities response noted following removal of the modalities.   Rehab Potential Excellent   PT Frequency 3x / week   PT Duration 4 weeks   PT Treatment/Interventions ADLs/Self Care Home Management;Cryotherapy;Occupational psychologist;Therapeutic activities;Therapeutic exercise;Neuromuscular re-education;Patient/family education;Manual techniques;Passive range of motion;Vasopneumatic Device   PT Next Visit Plan Continue with TKR protocol for L knee with modalities for pain/edema per MPT POC.   Consulted and Agree with Plan of Care Patient      Patient will benefit from skilled therapeutic intervention in order to improve the following deficits and  impairments:  Pain, Increased edema, Decreased strength, Decreased range of motion, Abnormal gait  Visit Diagnosis: Chronic pain of left knee  Stiffness of left knee, not elsewhere classified  Localized edema     Problem List Patient Active Problem List   Diagnosis Date Noted  . OA (osteoarthritis) of knee 08/08/2016  . Leukocytosis 04/29/2015  . Abdominal pain, left lower quadrant 04/29/2015  . Acute diverticulitis  04/27/2015  . Essential hypertension 04/27/2015    Wynelle Fanny, PTA 08/24/2016, 3:40 PM  [facility] [address] [place], [place], [address] Phone: [phone]   Fax:  [phone]  [name] MRN: [id number] Date of Birth: August 04, 1940

## 2016-08-25 ENCOUNTER — Encounter: Payer: Self-pay | Admitting: Physical Therapy

## 2016-08-25 ENCOUNTER — Ambulatory Visit: Payer: Medicare Other | Admitting: Physical Therapy

## 2016-08-25 DIAGNOSIS — G8929 Other chronic pain: Secondary | ICD-10-CM | POA: Diagnosis not present

## 2016-08-25 DIAGNOSIS — M25562 Pain in left knee: Secondary | ICD-10-CM | POA: Diagnosis not present

## 2016-08-25 DIAGNOSIS — R600 Localized edema: Secondary | ICD-10-CM

## 2016-08-25 DIAGNOSIS — M25662 Stiffness of left knee, not elsewhere classified: Secondary | ICD-10-CM | POA: Diagnosis not present

## 2016-08-25 NOTE — Therapy (Signed)
[facility] [place], [place], [address] Phone: [phone]   Fax:  [phone]  Physical Therapy Treatment  Patient Details  [name] MRN: [id number] Date of Birth: 04-18-1941 Referring Provider: Gaynelle Arabian MD  Encounter Date: 08/25/2016      PT End of Session - 08/25/16 1445    Visit Number 7   Number of Visits 12   Date for PT Re-Evaluation 10/11/16   PT Start Time 9937   PT Stop Time 1530   PT Time Calculation (min) 59 min   Activity Tolerance Patient tolerated treatment well   Behavior During Therapy [facility] for tasks assessed/performed      Past Medical History:  Diagnosis Date  . Arthritis   . Cancer (HCC)    hx of skin cancer   . Complication of anesthesia   . Essential hypertension   . GERD (gastroesophageal reflux disease)   . History of hiatal hernia   . PONV (postoperative nausea and vomiting)    per pt "slow to wake, difficult to put to sleep"     Past Surgical History:  Procedure Laterality Date  . BILATERAL SHOULDER SURGERY    . COLONOSCOPY    . EYE SURGERY     cataract extractions   . eyebrow surgery     . RIGHT FOOT SURGERY    . 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      Subjective Assessment - 08/25/16 1443    Subjective Reports that he has not been using cane around his home.   Patient Stated Goals Get out of pain and back to normal.   Currently in Pain? Yes   Pain Score 3    Pain Location Knee   Pain Orientation Left   Pain Descriptors / Indicators Sore;Discomfort   Pain Type Surgical pain   Pain Onset 1 to 4 weeks ago            [facility] PT Assessment - 08/25/16 0001      Assessment   Medical Diagnosis Left total knee replacement.   Onset Date/Surgical  Date 08/08/16   Next MD Visit 09/14/2016     Restrictions   Weight Bearing Restrictions No     ROM / Strength   AROM / PROM / Strength AROM     AROM   Overall AROM  Deficits   AROM Assessment Site Knee   Right/Left Knee Left   Left Knee Extension -3                     [facility] Adult PT Treatment/Exercise - 08/25/16 0001      Knee/Hip Exercises: Stretches   Active Hamstring Stretch Left;3 reps;30 seconds     Knee/Hip Exercises: Aerobic   Nustep L5, seat 11 x12 min     Knee/Hip Exercises: Standing   Forward Lunges Left;2 sets;10 reps;3 seconds   Forward Step Up Left;2 sets;10 reps;Hand Hold: 2;Step Height: 6"   Rocker Board 3 minutes     Knee/Hip Exercises: Supine   Short Arc Quad Sets Strengthening;Left;2 sets;10 reps   Short Arc Quad Sets Limitations 5 sec hold 3#   Straight Leg Raises AROM;Left;2 sets;10 reps     Knee/Hip Exercises: Sidelying   Hip ABduction AROM;Left;2 sets;10 reps  Modalities   Modalities Passenger transport manager Location L knee    Electrical Stimulation Action IFC   Electrical Stimulation Parameters 1-10 hz x15 min   Electrical Stimulation Goals Edema;Pain     Vasopneumatic   Number Minutes Vasopneumatic  15 minutes   Vasopnuematic Location  Knee   Vasopneumatic Pressure Medium   Vasopneumatic Temperature  55     Manual Therapy   Manual Therapy Passive ROM   Passive ROM PROM of L knee ext with gentle holds at end range                  PT Short Term Goals - 08/25/16 1520      PT SHORT TERM GOAL #1   Title Full active left knee extension in order to normalize gait.   Status On-going  AROM -3 deg from neutral 08/25/2016           PT Long Term Goals - 08/25/16 1520      PT LONG TERM GOAL #1   Title Independent with a HEP.   Time 8   Period Weeks   Status Achieved     PT LONG TERM GOAL #2   Title Active right knee flexion to 115 degrees+  so the patient can perform functional tasks and do so with pain not > 2-3/10.   Time 8   Period Weeks   Status Achieved  AROM R knee 115 deg flexion 08/18/2016     PT LONG TERM GOAL #3   Title Increase knee strength to a solid 5/5 to provide good stability for accomplishment of functional activities   Time 8   Period Weeks   Status On-going     PT LONG TERM GOAL #4   Title Perform a reciprocating stair gait with one railing with pain not > 2-3/10.   Time 8   Period Weeks   Status On-going     PT LONG TERM GOAL #5   Title Perform ADL's with pain not > 3/10.   Time 8   Period Weeks   Status Achieved               Plan - 08/25/16 1518    Clinical Impression Statement Patient tolerated today's treatment well and was observed in clinic walking without AD very well. Patient able to complete exercises well today and was introduced to forward step ups leading with LLE today which he completed well. Patient educated to be extremely careful as patient reports that his basement stairs at home do not have handrails. Improvement in L knee extension noted today as it was measured as 3 deg from neutral. Normal modalities response noted following removal of the modalities.   Rehab Potential Excellent   PT Frequency 3x / week   PT Duration 4 weeks   PT Treatment/Interventions ADLs/Self Care Home Management;Cryotherapy;Occupational psychologist;Therapeutic activities;Therapeutic exercise;Neuromuscular re-education;Patient/family education;Manual techniques;Passive range of motion;Vasopneumatic Device   PT Next Visit Plan Continue with TKR protocol for L knee with modalities for pain/edema per MPT POC.   Consulted and Agree with Plan of Care Patient      Patient will benefit from skilled therapeutic intervention in order to improve the following deficits and impairments:  Pain, Increased edema, Decreased strength, Decreased range of motion, Abnormal gait  Visit  Diagnosis: Chronic pain of left knee  Stiffness of left knee, not elsewhere classified  Localized edema     Problem List Patient Active Problem  List   Diagnosis Date Noted  . OA (osteoarthritis) of knee 08/08/2016  . Leukocytosis 04/29/2015  . Abdominal pain, left lower quadrant 04/29/2015  . Acute diverticulitis 04/27/2015  . Essential hypertension 04/27/2015    Wynelle Fanny, PTA 08/25/2016, 3:54 PM  [facility] [address] [place], [place], [address] Phone: [phone]   Fax:  [phone]  Name: ADEMOLA VERT MRN: [id number] Date of Birth: Nov 30, 1940

## 2016-08-29 ENCOUNTER — Ambulatory Visit: Payer: Medicare Other | Admitting: Physical Therapy

## 2016-08-29 ENCOUNTER — Encounter: Payer: Self-pay | Admitting: Physical Therapy

## 2016-08-29 DIAGNOSIS — M25562 Pain in left knee: Principal | ICD-10-CM

## 2016-08-29 DIAGNOSIS — G8929 Other chronic pain: Secondary | ICD-10-CM | POA: Diagnosis not present

## 2016-08-29 DIAGNOSIS — M25662 Stiffness of left knee, not elsewhere classified: Secondary | ICD-10-CM | POA: Diagnosis not present

## 2016-08-29 DIAGNOSIS — R600 Localized edema: Secondary | ICD-10-CM | POA: Diagnosis not present

## 2016-08-29 NOTE — Therapy (Signed)
[facility] [place], [place], [address] Phone: [phone]   Fax:  [phone]  Physical Therapy Treatment  Patient Details  [name] MRN: [id number] Date of Birth: 1940-07-01 Referring Provider: Gaynelle Arabian MD  Encounter Date: 08/29/2016      PT End of Session - 08/29/16 1305    Visit Number 8   Number of Visits 12   Date for PT Re-Evaluation 10/11/16   PT Start Time 1300   PT Stop Time 1355   PT Time Calculation (min) 55 min   Activity Tolerance Patient tolerated treatment well   Behavior During Therapy [facility] for tasks assessed/performed      Past Medical History:  Diagnosis Date  . Arthritis   . Cancer (HCC)    hx of skin cancer   . Complication of anesthesia   . Essential hypertension   . GERD (gastroesophageal reflux disease)   . History of hiatal hernia   . PONV (postoperative nausea and vomiting)    per pt "slow to wake, difficult to put to sleep"     Past Surgical History:  Procedure Laterality Date  . BILATERAL SHOULDER SURGERY    . COLONOSCOPY    . EYE SURGERY     cataract extractions   . eyebrow surgery     . RIGHT FOOT SURGERY    . 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      Subjective Assessment - 08/29/16 1301    Subjective Reports "some" L knee pain upon arrival. Reports that he went up and down his basement steps this morning.   Patient Stated Goals Get out of pain and back to normal.   Currently in Pain? Yes   Pain Score 3    Pain Location Knee   Pain Orientation Left   Pain Descriptors / Indicators Discomfort   Pain Type Surgical pain   Pain Onset 1 to 4 weeks ago            [facility] PT Assessment - 08/29/16 0001      Assessment   Medical Diagnosis Left  total knee replacement.   Onset Date/Surgical Date 08/08/16   Next MD Visit 09/14/2016     Restrictions   Weight Bearing Restrictions No     ROM / Strength   AROM / PROM / Strength AROM     AROM   Overall AROM  Deficits   AROM Assessment Site Knee   Right/Left Knee Left   Left Knee Extension -5                     [facility] Adult PT Treatment/Exercise - 08/29/16 0001      Knee/Hip Exercises: Stretches   Active Hamstring Stretch Left;3 reps;30 seconds     Knee/Hip Exercises: Aerobic   Nustep L5, seat 10 x13 min     Knee/Hip Exercises: Standing   Forward Lunges Left;2 sets;10 reps;3 seconds   Forward Step Up Left;2 sets;10 reps;Hand Hold: 2;Step Height: 8"     Knee/Hip Exercises: Seated   Long Arc Quad Strengthening;Left;3 sets;10 reps;Weights   Long Arc Quad Weight 3 lbs.     Knee/Hip Exercises: Supine   Straight Leg Raises AROM;Left;2 sets;10 reps     Knee/Hip Exercises: Sidelying   Hip  ABduction AROM;Left;2 sets;10 reps     Modalities   Modalities Passenger transport manager Location L knee    Electrical Stimulation Action IFC   Electrical Stimulation Parameters 1-10 hz x15 min   Electrical Stimulation Goals Edema;Pain     Vasopneumatic   Number Minutes Vasopneumatic  15 minutes   Vasopnuematic Location  Knee   Vasopneumatic Pressure Medium   Vasopneumatic Temperature  34     Manual Therapy   Manual Therapy Passive ROM;Soft tissue mobilization   Soft tissue mobilization L patella mobilizations in lat/med, sup/inf to promote proper mobility   Passive ROM PROM of L knee ext with gentle holds at end range                  PT Short Term Goals - 08/25/16 1520      PT SHORT TERM GOAL #1   Title Full active left knee extension in order to normalize gait.   Status On-going  AROM -3 deg from neutral 08/25/2016           PT Long Term Goals - 08/25/16 1520      PT LONG TERM  GOAL #1   Title Independent with a HEP.   Time 8   Period Weeks   Status Achieved     PT LONG TERM GOAL #2   Title Active right knee flexion to 115 degrees+ so the patient can perform functional tasks and do so with pain not > 2-3/10.   Time 8   Period Weeks   Status Achieved  AROM R knee 115 deg flexion 08/18/2016     PT LONG TERM GOAL #3   Title Increase knee strength to a solid 5/5 to provide good stability for accomplishment of functional activities   Time 8   Period Weeks   Status On-going     PT LONG TERM GOAL #4   Title Perform a reciprocating stair gait with one railing with pain not > 2-3/10.   Time 8   Period Weeks   Status On-going     PT LONG TERM GOAL #5   Title Perform ADL's with pain not > 3/10.   Time 8   Period Weeks   Status Achieved               Plan - 08/29/16 1344    Clinical Impression Statement Patient able to tolerate treatment well today with no complaints of any increased pain with any exercises. Patient tolerated 8" forward step ups very well and only a minimal L extensor lag observed during SLR. Patient ambulated into facility gym without AD today. 5 deg from neutral measured as AROM extension today following PROM into extension. Normal modalities response noted following removal of the modalities.   Rehab Potential Excellent   PT Frequency 3x / week   PT Duration 4 weeks   PT Treatment/Interventions ADLs/Self Care Home Management;Cryotherapy;Occupational psychologist;Therapeutic activities;Therapeutic exercise;Neuromuscular re-education;Patient/family education;Manual techniques;Passive range of motion;Vasopneumatic Device   PT Next Visit Plan Continue with TKR protocol for L knee with modalities for pain/edema per MPT POC.   Consulted and Agree with Plan of Care Patient      Patient will benefit from skilled therapeutic intervention in order to improve the following deficits and impairments:  Pain, Increased  edema, Decreased strength, Decreased range of motion, Abnormal gait  Visit Diagnosis: Chronic pain of left knee  Stiffness of left knee, not elsewhere classified  Localized edema  Problem List Patient Active Problem List   Diagnosis Date Noted  . OA (osteoarthritis) of knee 08/08/2016  . Leukocytosis 04/29/2015  . Abdominal pain, left lower quadrant 04/29/2015  . Acute diverticulitis 04/27/2015  . Essential hypertension 04/27/2015    Wynelle Fanny, PTA 08/29/2016, 2:09 PM  [facility] [address] [place], [place], [address] Phone: [phone]   Fax:  [phone]  [name] MRN: [id number] Date of Birth: [birth year]

## 2016-08-30 DIAGNOSIS — D229 Melanocytic nevi, unspecified: Secondary | ICD-10-CM | POA: Diagnosis not present

## 2016-08-30 DIAGNOSIS — L570 Actinic keratosis: Secondary | ICD-10-CM | POA: Diagnosis not present

## 2016-08-30 DIAGNOSIS — Z85828 Personal history of other malignant neoplasm of skin: Secondary | ICD-10-CM | POA: Diagnosis not present

## 2016-08-31 ENCOUNTER — Encounter: Payer: Self-pay | Admitting: Physical Therapy

## 2016-08-31 ENCOUNTER — Ambulatory Visit: Payer: Medicare Other | Admitting: Physical Therapy

## 2016-08-31 DIAGNOSIS — G8929 Other chronic pain: Secondary | ICD-10-CM

## 2016-08-31 DIAGNOSIS — R600 Localized edema: Secondary | ICD-10-CM

## 2016-08-31 DIAGNOSIS — M25562 Pain in left knee: Principal | ICD-10-CM

## 2016-08-31 DIAGNOSIS — M25662 Stiffness of left knee, not elsewhere classified: Secondary | ICD-10-CM

## 2016-08-31 NOTE — Therapy (Signed)
[facility] [place], [place], [address] Phone: [phone]   Fax:  [phone]  Physical Therapy Treatment  Patient Details  [name] MRN: [id number] Date of Birth: 1940-05-18 Referring Provider: Gaynelle Arabian MD  Encounter Date: 08/31/2016      PT End of Session - 08/31/16 1303    Visit Number 9   Number of Visits 12   Date for PT Re-Evaluation 10/11/16   PT Start Time 1300   PT Stop Time 1356   PT Time Calculation (min) 56 min   Activity Tolerance Patient tolerated treatment well   Behavior During Therapy [facility] for tasks assessed/performed      Past Medical History:  Diagnosis Date  . Arthritis   . Cancer (HCC)    hx of skin cancer   . Complication of anesthesia   . Essential hypertension   . GERD (gastroesophageal reflux disease)   . History of hiatal hernia   . PONV (postoperative nausea and vomiting)    per pt "slow to wake, difficult to put to sleep"     Past Surgical History:  Procedure Laterality Date  . BILATERAL SHOULDER SURGERY    . COLONOSCOPY    . EYE SURGERY     cataract extractions   . eyebrow surgery     . RIGHT FOOT SURGERY    . 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      Subjective Assessment - 08/31/16 1301    Subjective Reports that his knee is okay today. Reports trying some UE exercises last night and that hasn't agreed with his hiatal hernia.   Patient Stated Goals Get out of pain and back to normal.   Currently in Pain? Other (Comment)  No pain assessment provided by patient            [facility] PT Assessment - 08/31/16 0001      Assessment   Medical Diagnosis Left total knee replacement.   Onset Date/Surgical Date 08/08/16   Next MD Visit 09/14/2016     Restrictions   Weight Bearing Restrictions No     ROM / Strength   AROM / PROM / Strength AROM     AROM   Overall AROM  Deficits   AROM Assessment Site Knee   Right/Left Knee Left   Left Knee Extension -4                     [facility] Adult PT Treatment/Exercise - 08/31/16 0001      Knee/Hip Exercises: Aerobic   Nustep L5, seat 7 x10 min     Knee/Hip Exercises: Standing   Lateral Step Up Left;2 sets;10 reps;Hand Hold: 2;Step Height: 6"   Forward Step Up Left;2 sets;10 reps;Hand Hold: 2;Step Height: 8"     Knee/Hip Exercises: Supine   Straight Leg Raises AROM;Left;2 sets;10 reps     Knee/Hip Exercises: Sidelying   Hip ABduction AROM;Left;2 sets;10 reps     Modalities   Modalities Passenger transport manager Location L knee    Electrical Stimulation Action IFC   Electrical Stimulation Parameters 1-10 hz x10 min   Electrical Stimulation Goals Edema;Pain     Vasopneumatic   Number Minutes Vasopneumatic  15 minutes  Vasopnuematic Location  Knee   Vasopneumatic Pressure Medium   Vasopneumatic Temperature  40     Manual Therapy   Manual Therapy Passive ROM;Soft tissue mobilization   Soft tissue mobilization L patella mobilizations in lat/med, sup/inf to promote proper mobility; L incision mobilizations to promote proper mobility   Passive ROM PROM of L knee ext with gentle holds at end range                  PT Short Term Goals - 08/31/16 1350      PT SHORT TERM GOAL #1   Title Full active left knee extension in order to normalize gait.   Status On-going  AROM -4 deg from neutral 08/31/2016           PT Long Term Goals - 08/25/16 1520      PT LONG TERM GOAL #1   Title Independent with a HEP.   Time 8   Period Weeks   Status Achieved     PT LONG TERM GOAL #2   Title Active right knee flexion to 115 degrees+ so the patient can perform functional tasks and do so with pain not >  2-3/10.   Time 8   Period Weeks   Status Achieved  AROM R knee 115 deg flexion 08/18/2016     PT LONG TERM GOAL #3   Title Increase knee strength to a solid 5/5 to provide good stability for accomplishment of functional activities   Time 8   Period Weeks   Status On-going     PT LONG TERM GOAL #4   Title Perform a reciprocating stair gait with one railing with pain not > 2-3/10.   Time 8   Period Weeks   Status On-going     PT LONG TERM GOAL #5   Title Perform ADL's with pain not > 3/10.   Time 8   Period Weeks   Status Achieved               Plan - 08/31/16 1341    Clinical Impression Statement Patient did very well during today's treatment with no reports of any increased pain with any of the exercises. Improvement noted with hip abduction in SL today. Patient continues to improve with AROM  L knee extension as noted by the measurement. Good L patella and incision mobility as all steristrips have been removed at this time. Normal modalities response noted following removal of the modalities.   Rehab Potential Excellent   PT Frequency 3x / week   PT Duration 4 weeks   PT Treatment/Interventions ADLs/Self Care Home Management;Cryotherapy;Occupational psychologist;Therapeutic activities;Therapeutic exercise;Neuromuscular re-education;Patient/family education;Manual techniques;Passive range of motion;Vasopneumatic Device   PT Next Visit Plan Continue with TKR protocol for L knee with modalities for pain/edema per MPT POC.   Consulted and Agree with Plan of Care Patient      Patient will benefit from skilled therapeutic intervention in order to improve the following deficits and impairments:  Pain, Increased edema, Decreased strength, Decreased range of motion, Abnormal gait  Visit Diagnosis: Chronic pain of left knee  Stiffness of left knee, not elsewhere classified  Localized edema     Problem List Patient Active Problem List    Diagnosis Date Noted  . OA (osteoarthritis) of knee 08/08/2016  . Leukocytosis 04/29/2015  . Abdominal pain, left lower quadrant 04/29/2015  . Acute diverticulitis 04/27/2015  . Essential hypertension 04/27/2015    Wynelle Fanny, PTA 08/31/2016, 1:59 PM  [facility] [place], [place], [address] Phone: [phone]   Fax:  [phone]  [name] MRN: [id number] Date of Birth: Sep 14, 1940

## 2016-09-01 ENCOUNTER — Encounter: Payer: Self-pay | Admitting: Physical Therapy

## 2016-09-01 ENCOUNTER — Ambulatory Visit: Payer: Medicare Other | Admitting: Physical Therapy

## 2016-09-01 DIAGNOSIS — R600 Localized edema: Secondary | ICD-10-CM | POA: Diagnosis not present

## 2016-09-01 DIAGNOSIS — M25562 Pain in left knee: Secondary | ICD-10-CM | POA: Diagnosis not present

## 2016-09-01 DIAGNOSIS — G8929 Other chronic pain: Secondary | ICD-10-CM

## 2016-09-01 DIAGNOSIS — M25662 Stiffness of left knee, not elsewhere classified: Secondary | ICD-10-CM | POA: Diagnosis not present

## 2016-09-01 NOTE — Therapy (Signed)
[facility] [place], [place], [address] Phone: [phone]   Fax:  [phone]  Physical Therapy Treatment  Patient Details  [name] MRN: [id number] Date of Birth: 1941-02-02 Referring Provider: Gaynelle Arabian MD  Encounter Date: 09/01/2016      PT End of Session - 09/01/16 1322    Visit Number 10   Number of Visits 12   Date for PT Re-Evaluation 10/11/16   PT Start Time 4970   PT Stop Time 1401   PT Time Calculation (min) 47 min   Activity Tolerance Patient tolerated treatment well   Behavior During Therapy [facility] for tasks assessed/performed      Past Medical History:  Diagnosis Date  . Arthritis   . Cancer (HCC)    hx of skin cancer   . Complication of anesthesia   . Essential hypertension   . GERD (gastroesophageal reflux disease)   . History of hiatal hernia   . PONV (postoperative nausea and vomiting)    per pt "slow to wake, difficult to put to sleep"     Past Surgical History:  Procedure Laterality Date  . BILATERAL SHOULDER SURGERY    . COLONOSCOPY    . EYE SURGERY     cataract extractions   . eyebrow surgery     . RIGHT FOOT SURGERY    . 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      Subjective Assessment - 09/01/16 1318    Subjective Reports he thinks his knee is better than yesterday.   Patient Stated Goals Get out of pain and back to normal.   Currently in Pain? Other (Comment)  No pain assessment provided by patient            [facility] PT Assessment - 09/01/16 0001      Assessment   Medical Diagnosis Left total knee replacement.   Onset Date/Surgical Date 08/08/16   Next MD Visit 09/14/2016     Restrictions   Weight Bearing Restrictions No                      [facility] Adult PT Treatment/Exercise - 09/01/16 0001      Knee/Hip Exercises: Stretches   Active Hamstring Stretch Left;4 reps;20 seconds     Knee/Hip Exercises: Aerobic   Nustep L2, seat 9 x10 min     Knee/Hip Exercises: Standing   Terminal Knee Extension Limitations Green theraband LLE x20 reps   Lateral Step Up Left;2 sets;10 reps;Hand Hold: 2;Step Height: 6"   Forward Step Up Left;3 sets;10 reps;Hand Hold: 2;Step Height: 8"     Knee/Hip Exercises: Seated   Long Arc Quad Strengthening;Left;3 sets;10 reps;Weights   Long Arc Quad Weight 4 lbs.     Knee/Hip Exercises: Sidelying   Hip ABduction AROM;Left;2 sets;10 reps     Modalities   Modalities Passenger transport manager Location L knee   Electrical Stimulation Action IFC   Electrical Stimulation Parameters 1-10 hz x15 min   Electrical Stimulation Goals Edema;Pain     Vasopneumatic   Number Minutes Vasopneumatic  15 minutes   Vasopnuematic Location  Knee   Vasopneumatic Pressure Medium   Vasopneumatic Temperature  max cold  PT Short Term Goals - 08/31/16 1350      PT SHORT TERM GOAL #1   Title Full active left knee extension in order to normalize gait.   Status On-going  AROM -4 deg from neutral 08/31/2016           PT Long Term Goals - 08/25/16 1520      PT LONG TERM GOAL #1   Title Independent with a HEP.   Time 8   Period Weeks   Status Achieved     PT LONG TERM GOAL #2   Title Active right knee flexion to 115 degrees+ so the patient can perform functional tasks and do so with pain not > 2-3/10.   Time 8   Period Weeks   Status Achieved  AROM R knee 115 deg flexion 08/18/2016     PT LONG TERM GOAL #3   Title Increase knee strength to a solid 5/5 to provide good stability for accomplishment of functional activities   Time 8   Period Weeks   Status On-going     PT LONG TERM GOAL #4    Title Perform a reciprocating stair gait with one railing with pain not > 2-3/10.   Time 8   Period Weeks   Status On-going     PT LONG TERM GOAL #5   Title Perform ADL's with pain not > 3/10.   Time 8   Period Weeks   Status Achieved               Plan - 09-17-16 1408    Clinical Impression Statement Patient continues to do very well in treatment as he had reports of pain with stationary bike today. L hip abductors and Quad weakness noted again today in treatment with compensations. L lower leg continues to present with edema which is visible upon observation from knee to ankle. Normal modalities response noted following removal of the modalities.    Rehab Potential Excellent   PT Frequency 3x / week   PT Duration 4 weeks   PT Treatment/Interventions ADLs/Self Care Home Management;Cryotherapy;Occupational psychologist;Therapeutic activities;Therapeutic exercise;Neuromuscular re-education;Patient/family education;Manual techniques;Passive range of motion;Vasopneumatic Device   PT Next Visit Plan Continue with TKR protocol for L knee with modalities for pain/edema per MPT POC.   Consulted and Agree with Plan of Care Patient      Patient will benefit from skilled therapeutic intervention in order to improve the following deficits and impairments:  Pain, Increased edema, Decreased strength, Decreased range of motion, Abnormal gait  Visit Diagnosis: Chronic pain of left knee  Stiffness of left knee, not elsewhere classified  Localized edema       G-Codes - 09-17-16 1512    Functional Assessment Tool Used (Outpatient Only) FOTO...38% limitation....10th visit.   Functional Limitation Mobility: Walking and moving around   Mobility: Walking and Moving Around Current Status [phone]) At least 20 percent but less than 40 percent impaired, limited or restricted   Mobility: Walking and Moving Around Goal Status [phone]) At least 20 percent but less than 40  percent impaired, limited or restricted      Problem List Patient Active Problem List   Diagnosis Date Noted  . OA (osteoarthritis) of knee 08/08/2016  . Leukocytosis 04/29/2015  . Abdominal pain, left lower quadrant 04/29/2015  . Acute diverticulitis 04/27/2015  . Essential hypertension 04/27/2015    Ahmed Prima, PTA 2016/09/17 3:13 PM Mali Applegate MPT [facility] [place], [place], [address] Phone:  [phone]   Fax:  [phone]  Name: KIPTON SKILLEN MRN: [id number] Date of Birth: April 03, 1941

## 2016-09-05 ENCOUNTER — Encounter: Payer: Self-pay | Admitting: Physical Therapy

## 2016-09-05 ENCOUNTER — Ambulatory Visit: Payer: Medicare Other | Admitting: Physical Therapy

## 2016-09-05 DIAGNOSIS — R600 Localized edema: Secondary | ICD-10-CM

## 2016-09-05 DIAGNOSIS — M25562 Pain in left knee: Principal | ICD-10-CM

## 2016-09-05 DIAGNOSIS — G8929 Other chronic pain: Secondary | ICD-10-CM | POA: Diagnosis not present

## 2016-09-05 DIAGNOSIS — M25662 Stiffness of left knee, not elsewhere classified: Secondary | ICD-10-CM | POA: Diagnosis not present

## 2016-09-05 NOTE — Therapy (Signed)
[facility] [place], [place], [address] Phone: [phone]   Fax:  [phone]  Physical Therapy Treatment  Patient Details  [name] MRN: [id number] Date of Birth: 05-30-1940 Referring Provider: Gaynelle Arabian MD  Encounter Date: 09/05/2016      PT End of Session - 09/05/16 1353    Visit Number 11   Number of Visits 12   Date for PT Re-Evaluation 10/11/16   PT Start Time 9326   PT Stop Time 1445   PT Time Calculation (min) 53 min   Activity Tolerance Patient tolerated treatment well   Behavior During Therapy [facility] for tasks assessed/performed      Past Medical History:  Diagnosis Date  . Arthritis   . Cancer (HCC)    hx of skin cancer   . Complication of anesthesia   . Essential hypertension   . GERD (gastroesophageal reflux disease)   . History of hiatal hernia   . PONV (postoperative nausea and vomiting)    per pt "slow to wake, difficult to put to sleep"     Past Surgical History:  Procedure Laterality Date  . BILATERAL SHOULDER SURGERY    . COLONOSCOPY    . EYE SURGERY     cataract extractions   . eyebrow surgery     . RIGHT FOOT SURGERY    . 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      Subjective Assessment - 09/05/16 1352    Subjective Reports that he thinks he may have pulled muscle in R low back or glute region with the bike.   Patient Stated Goals Get out of pain and back to normal.   Currently in Pain? Other (Comment)  No pain assessment provided            [facility] PT Assessment - 09/05/16 0001      Assessment   Medical Diagnosis Left total knee replacement.   Onset Date/Surgical Date 08/08/16   Next MD Visit 09/14/2016     Restrictions   Weight Bearing Restrictions  No     ROM / Strength   AROM / PROM / Strength AROM     AROM   Overall AROM  Deficits   AROM Assessment Site Knee   Right/Left Knee Left   Left Knee Extension -5  Before and after PROM                     [facility] Adult PT Treatment/Exercise - 09/05/16 0001      Knee/Hip Exercises: Aerobic   Stationary Bike L2 x10 min     Knee/Hip Exercises: Machines for Strengthening   Cybex Knee Extension 10# 3x10 reps   Cybex Knee Flexion 30# 3x10 reps     Knee/Hip Exercises: Standing   Terminal Knee Extension Limitations Green theraband LLE x20 reps   Rocker Board 3 minutes     Knee/Hip Exercises: Supine   Straight Leg Raises AROM;Left;3 sets;10 reps     Knee/Hip Exercises: Sidelying   Hip ABduction AROM;Left;3 sets;10 reps     Modalities   Modalities Passenger transport manager Location L knee   Electrical Stimulation Action IFC   Electrical Stimulation Parameters 1-10 hz x15 min  Electrical Stimulation Goals Edema;Pain     Vasopneumatic   Number Minutes Vasopneumatic  15 minutes   Vasopnuematic Location  Knee   Vasopneumatic Pressure Medium   Vasopneumatic Temperature  44     Manual Therapy   Manual Therapy Passive ROM   Passive ROM PROM of L knee ext with gentle holds at end range                  PT Short Term Goals - 08/31/16 1350      PT SHORT TERM GOAL #1   Title Full active left knee extension in order to normalize gait.   Status On-going  AROM -4 deg from neutral 08/31/2016           PT Long Term Goals - 08/25/16 1520      PT LONG TERM GOAL #1   Title Independent with a HEP.   Time 8   Period Weeks   Status Achieved     PT LONG TERM GOAL #2   Title Active right knee flexion to 115 degrees+ so the patient can perform functional tasks and do so with pain not > 2-3/10.   Time 8   Period Weeks   Status Achieved  AROM R knee 115 deg flexion 08/18/2016     PT LONG TERM  GOAL #3   Title Increase knee strength to a solid 5/5 to provide good stability for accomplishment of functional activities   Time 8   Period Weeks   Status On-going     PT LONG TERM GOAL #4   Title Perform a reciprocating stair gait with one railing with pain not > 2-3/10.   Time 8   Period Weeks   Status On-going     PT LONG TERM GOAL #5   Title Perform ADL's with pain not > 3/10.   Time 8   Period Weeks   Status Achieved               Plan - 09/05/16 1431    Clinical Impression Statement Patient continues to do very well with treatment and was introduced to machine knee strengthening with no complaint from patient. Patient able to complete TKE with green theraband in good form. 5 deg from neutral measured for knee extension today prior to and after PROM of R knee. Mali Applegate, MPT assessed patient's LBP and stated that patient's pain may be from R SI joint. Normal modalities response noted following removal of the modalities.   Rehab Potential Excellent   PT Frequency 3x / week   PT Duration 4 weeks   PT Treatment/Interventions ADLs/Self Care Home Management;Cryotherapy;Occupational psychologist;Therapeutic activities;Therapeutic exercise;Neuromuscular re-education;Patient/family education;Manual techniques;Passive range of motion;Vasopneumatic Device   PT Next Visit Plan Continue with TKR protocol for L knee with modalities for pain/edema per MPT POC.   Consulted and Agree with Plan of Care Patient      Patient will benefit from skilled therapeutic intervention in order to improve the following deficits and impairments:  Pain, Increased edema, Decreased strength, Decreased range of motion, Abnormal gait  Visit Diagnosis: Chronic pain of left knee  Stiffness of left knee, not elsewhere classified  Localized edema     Problem List Patient Active Problem List   Diagnosis Date Noted  . OA (osteoarthritis) of knee 08/08/2016  .  Leukocytosis 04/29/2015  . Abdominal pain, left lower quadrant 04/29/2015  . Acute diverticulitis 04/27/2015  . Essential hypertension 04/27/2015    Wynelle Fanny, PTA 09/05/2016, 2:50  PM  [facility] [place], [place], [address] Phone: [phone]   Fax:  [phone]  [name] MRN: [id number] Date of Birth: 1940-10-05

## 2016-09-07 ENCOUNTER — Ambulatory Visit: Payer: Medicare Other | Attending: Orthopedic Surgery | Admitting: Physical Therapy

## 2016-09-07 DIAGNOSIS — G8929 Other chronic pain: Secondary | ICD-10-CM | POA: Insufficient documentation

## 2016-09-07 DIAGNOSIS — M25662 Stiffness of left knee, not elsewhere classified: Secondary | ICD-10-CM | POA: Diagnosis not present

## 2016-09-07 DIAGNOSIS — R600 Localized edema: Secondary | ICD-10-CM | POA: Diagnosis not present

## 2016-09-07 DIAGNOSIS — M25562 Pain in left knee: Secondary | ICD-10-CM | POA: Diagnosis not present

## 2016-09-07 NOTE — Therapy (Signed)
[facility] [place], [place], [address] Phone: [phone]   Fax:  [phone]  Physical Therapy Treatment  Patient Details  [name] MRN: [id number] Date of Birth: 1940-11-07 Referring Provider: Gaynelle Arabian MD  Encounter Date: 09/07/2016      PT End of Session - 09/07/16 1258    Number of Visits 8   Date for PT Re-Evaluation 11/08/16      Past Medical History:  Diagnosis Date  . Arthritis   . Cancer (HCC)    hx of skin cancer   . Complication of anesthesia   . Essential hypertension   . GERD (gastroesophageal reflux disease)   . History of hiatal hernia   . PONV (postoperative nausea and vomiting)    per pt "slow to wake, difficult to put to sleep"     Past Surgical History:  Procedure Laterality Date  . BILATERAL SHOULDER SURGERY    . COLONOSCOPY    . EYE SURGERY     cataract extractions   . eyebrow surgery     . RIGHT FOOT SURGERY    . 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      Subjective Assessment - 09/07/16 1247    Subjective Did some work the other day and overdid it.   Patient Stated Goals Get out of pain and back to normal.   Pain Score 4    Pain Location Knee   Pain Orientation Left   Pain Descriptors / Indicators Discomfort   Pain Type Surgical pain   Pain Onset More than a month ago   Pain Frequency Intermittent            [facility] PT Assessment - 09/07/16 0001      AROM   Left Knee Flexion --  Passive left knee flexion to 125 degrees today.                     [facility] Adult PT Treatment/Exercise - 09/07/16 0001      Exercises   Exercises Knee/Hip     Knee/Hip Exercises: Aerobic   Stationary Bike Level 2 x 15 minutes.     Knee/Hip Exercises:  Machines for Strengthening   Cybex Knee Extension 10# x 5 minutes.     Modalities   Modalities Passenger transport manager Location --  Left knee.   Electrical Stimulation Action IFC   Electrical Stimulation Parameters 80-150 Hz x 20 minutes.   Electrical Stimulation Goals Edema;Pain     Vasopneumatic   Number Minutes Vasopneumatic  15 minutes   Vasopnuematic Location  --  Left knee.   Vasopneumatic Pressure Medium     Manual Therapy   Manual Therapy Passive ROM   Passive ROM Passive extension stretching to patoient's left knee in supine x 5 minutes.                  PT Short Term Goals - 08/31/16 1350      PT SHORT TERM GOAL #1   Title Full active left knee extension in order to normalize gait.   Status On-going  AROM -4 deg from neutral 08/31/2016           PT Long Term Goals -  08/25/16 1520      PT LONG TERM GOAL #1   Title Independent with a HEP.   Time 8   Period Weeks   Status Achieved     PT LONG TERM GOAL #2   Title Active right knee flexion to 115 degrees+ so the patient can perform functional tasks and do so with pain not > 2-3/10.   Time 8   Period Weeks   Status Achieved  AROM R knee 115 deg flexion 08/18/2016     PT LONG TERM GOAL #3   Title Increase knee strength to a solid 5/5 to provide good stability for accomplishment of functional activities   Time 8   Period Weeks   Status On-going     PT LONG TERM GOAL #4   Title Perform a reciprocating stair gait with one railing with pain not > 2-3/10.   Time 8   Period Weeks   Status On-going     PT LONG TERM GOAL #5   Title Perform ADL's with pain not > 3/10.   Time 8   Period Weeks   Status Achieved               Plan - 09/07/16 1248    Clinical Impression Statement Patient doing well with a passive left knee flexion measurement of 125 degrees.  Recommened continue with his prone hand exercise at home which he  admits to not doing recently.  He has a 2 1/2 ankle weight which he intends to use.      Patient will benefit from skilled therapeutic intervention in order to improve the following deficits and impairments:  Pain, Increased edema, Decreased strength, Decreased range of motion, Abnormal gait  Visit Diagnosis: Chronic pain of left knee - Plan: PT plan of care cert/re-cert  Stiffness of left knee, not elsewhere classified - Plan: PT plan of care cert/re-cert  Localized edema - Plan: PT plan of care cert/re-cert     Problem List Patient Active Problem List   Diagnosis Date Noted  . OA (osteoarthritis) of knee 08/08/2016  . Leukocytosis 04/29/2015  . Abdominal pain, left lower quadrant 04/29/2015  . Acute diverticulitis 04/27/2015  . Essential hypertension 04/27/2015    Gus Littler, [place] MPT 09/07/2016, 1:03 PM  [facility] [address] [place], [place], [address] Phone: [phone]   Fax:  [phone]  [name] MRN: [id number] Date of Birth: 1940-10-01

## 2016-09-08 ENCOUNTER — Encounter: Payer: Medicare Other | Admitting: Physical Therapy

## 2016-09-12 ENCOUNTER — Ambulatory Visit: Payer: Medicare Other | Admitting: Physical Therapy

## 2016-09-12 DIAGNOSIS — G8929 Other chronic pain: Secondary | ICD-10-CM | POA: Diagnosis not present

## 2016-09-12 DIAGNOSIS — M25662 Stiffness of left knee, not elsewhere classified: Secondary | ICD-10-CM | POA: Diagnosis not present

## 2016-09-12 DIAGNOSIS — R600 Localized edema: Secondary | ICD-10-CM

## 2016-09-12 DIAGNOSIS — M25562 Pain in left knee: Secondary | ICD-10-CM | POA: Diagnosis not present

## 2016-09-12 NOTE — Therapy (Signed)
[facility] [place], [place], [address] Phone: [phone]   Fax:  [phone]  Physical Therapy Treatment  Patient Details  [name] MRN: [id number] Date of Birth: 12/02/1940 Referring Provider: Gaynelle Arabian MD  Encounter Date: 09/12/2016      PT End of Session - 09/12/16 1452    Visit Number 13   Number of Visits 16   Date for PT Re-Evaluation 11/08/16   PT Start Time 0152      Past Medical History:  Diagnosis Date  . Arthritis   . Cancer (HCC)    hx of skin cancer   . Complication of anesthesia   . Essential hypertension   . GERD (gastroesophageal reflux disease)   . History of hiatal hernia   . PONV (postoperative nausea and vomiting)    per pt "slow to wake, difficult to put to sleep"     Past Surgical History:  Procedure Laterality Date  . BILATERAL SHOULDER SURGERY    . COLONOSCOPY    . EYE SURGERY     cataract extractions   . eyebrow surgery     . RIGHT FOOT SURGERY    . 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      Subjective Assessment - 09/12/16 1428    Subjective I did my prone hang exercise over the weekend with 2 1/2 pound weight.   Pain Score 3    Pain Location Knee   Pain Orientation Right   Pain Descriptors / Indicators Discomfort   Pain Type Surgical pain   Pain Frequency Intermittent                         [facility] Adult PT Treatment/Exercise - 09/12/16 0001      Exercises   Exercises Knee/Hip     Knee/Hip Exercises: Stretches   Other Knee/Hip Stretches In supine:  10# extension stretch x 3 minutes with weight just distal to patella but not over patient's left patella.     Knee/Hip Exercises: Aerobic   Stationary Bike Level 2 x 20 minutes      Knee/Hip Exercises: Supine   Short Arc Quad Sets Limitations SAQ's x 15 minutes facilitated with VMS with 10 second extension holds and 10 second rest with 5#.     Modalities   Modalities Location manager Stimulation Location Left knee.   Electrical Stimulation Action IFC   Electrical Stimulation Parameters 80-150 Hz x 20 minutes.     Vasopneumatic   Number Minutes Vasopneumatic  20 minutes   Vasopnuematic Location  --  Left knee.   Vasopneumatic Pressure Medium                  PT Short Term Goals - 08/31/16 1350      PT SHORT TERM GOAL #1   Title Full active left knee extension in order to normalize gait.   Status On-going  AROM -4 deg from neutral 08/31/2016           PT Long Term Goals - 08/25/16 1520      PT LONG TERM GOAL #1   Title Independent with a HEP.   Time 8   Period Weeks  Status Achieved     PT LONG TERM GOAL #2   Title Active right knee flexion to 115 degrees+ so the patient can perform functional tasks and do so with pain not > 2-3/10.   Time 8   Period Weeks   Status Achieved  AROM R knee 115 deg flexion 08/18/2016     PT LONG TERM GOAL #3   Title Increase knee strength to a solid 5/5 to provide good stability for accomplishment of functional activities   Time 8   Period Weeks   Status On-going     PT LONG TERM GOAL #4   Title Perform a reciprocating stair gait with one railing with pain not > 2-3/10.   Time 8   Period Weeks   Status On-going     PT LONG TERM GOAL #5   Title Perform ADL's with pain not > 3/10.   Time 8   Period Weeks   Status Achieved             Patient will benefit from skilled therapeutic intervention in order to improve the following deficits and impairments:     Visit Diagnosis: Chronic pain of left knee  Stiffness of left knee, not elsewhere classified  Localized edema     Problem List Patient Active Problem List    Diagnosis Date Noted  . OA (osteoarthritis) of knee 08/08/2016  . Leukocytosis 04/29/2015  . Abdominal pain, left lower quadrant 04/29/2015  . Acute diverticulitis 04/27/2015  . Essential hypertension 04/27/2015    Darlene Bartelt, [place] MPT 09/12/2016, 2:55 PM  [facility] [address] [place], [place], [address] Phone: [phone]   Fax:  [phone]  [name] MRN: [id number] Date of Birth: 1941-01-16

## 2016-09-13 ENCOUNTER — Ambulatory Visit: Payer: Medicare Other | Admitting: Physical Therapy

## 2016-09-13 ENCOUNTER — Encounter: Payer: Self-pay | Admitting: Physical Therapy

## 2016-09-13 DIAGNOSIS — G8929 Other chronic pain: Secondary | ICD-10-CM | POA: Diagnosis not present

## 2016-09-13 DIAGNOSIS — M25562 Pain in left knee: Secondary | ICD-10-CM | POA: Diagnosis not present

## 2016-09-13 DIAGNOSIS — R600 Localized edema: Secondary | ICD-10-CM

## 2016-09-13 DIAGNOSIS — M25662 Stiffness of left knee, not elsewhere classified: Secondary | ICD-10-CM | POA: Diagnosis not present

## 2016-09-13 NOTE — Therapy (Signed)
[facility] [place], [place], [address] Phone: [phone]   Fax:  [phone]  Physical Therapy Treatment  Patient Details  [name] MRN: [id number] Date of Birth: 10/23/1940 Referring Provider: Gaynelle Arabian MD  Encounter Date: 09/13/2016      PT End of Session - 09/13/16 1401    Visit Number 14   Number of Visits 16   Date for PT Re-Evaluation 11/08/16   PT Start Time 6283   PT Stop Time 1448   PT Time Calculation (min) 49 min   Activity Tolerance Patient tolerated treatment well   Behavior During Therapy [facility] for tasks assessed/performed      Past Medical History:  Diagnosis Date  . Arthritis   . Cancer (HCC)    hx of skin cancer   . Complication of anesthesia   . Essential hypertension   . GERD (gastroesophageal reflux disease)   . History of hiatal hernia   . PONV (postoperative nausea and vomiting)    per pt "slow to wake, difficult to put to sleep"     Past Surgical History:  Procedure Laterality Date  . BILATERAL SHOULDER SURGERY    . COLONOSCOPY    . EYE SURGERY     cataract extractions   . eyebrow surgery     . RIGHT FOOT SURGERY    . 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      Subjective Assessment - 09/13/16 1359    Subjective Patient arrived with only a minimal pain today.   Patient Stated Goals Get out of pain and back to normal.   Currently in Pain? Yes   Pain Score 3    Pain Location Knee   Pain Orientation Right   Pain Descriptors / Indicators Discomfort   Pain Type Surgical pain   Pain Onset More than a month ago            [facility] PT Assessment - 09/13/16 0001      Assessment   Medical Diagnosis Left total knee replacement.   Onset Date/Surgical Date  08/08/16   Next MD Visit 09/14/2016     Restrictions   Weight Bearing Restrictions No                     [facility] Adult PT Treatment/Exercise - 09/13/16 0001      Knee/Hip Exercises: Aerobic   Stationary Bike L3, seat 5 x10 min     Knee/Hip Exercises: Machines for Strengthening   Cybex Knee Extension 10# 3x10 reps   Cybex Knee Flexion 30# 3x10 reps   Cybex Leg Press 2 pl, seat 6, 3x10 reps     Knee/Hip Exercises: Standing   Terminal Knee Extension Limitations Green theraband LLE x20 reps   Lateral Step Up Left;3 sets;10 reps;Hand Hold: 2;Step Height: 8"   Forward Step Up Left;3 sets;10 reps;Hand Hold: 2;Step Height: 8"     Modalities   Modalities Passenger transport manager Location L knee    Electrical Stimulation Action IFC   Electrical Stimulation Parameters 1-10 hz x15 min   Electrical Stimulation Goals Edema;Pain     Vasopneumatic   Number Minutes Vasopneumatic  15 minutes   Vasopnuematic Location  Knee  Vasopneumatic Pressure Medium                  PT Short Term Goals - 08/31/16 1350      PT SHORT TERM GOAL #1   Title Full active left knee extension in order to normalize gait.   Status On-going  AROM -4 deg from neutral 08/31/2016           PT Long Term Goals - 08/25/16 1520      PT LONG TERM GOAL #1   Title Independent with a HEP.   Time 8   Period Weeks   Status Achieved     PT LONG TERM GOAL #2   Title Active right knee flexion to 115 degrees+ so the patient can perform functional tasks and do so with pain not > 2-3/10.   Time 8   Period Weeks   Status Achieved  AROM R knee 115 deg flexion 08/18/2016     PT LONG TERM GOAL #3   Title Increase knee strength to a solid 5/5 to provide good stability for accomplishment of functional activities   Time 8   Period Weeks   Status On-going     PT LONG TERM GOAL #4   Title Perform a reciprocating stair gait with one  railing with pain not > 2-3/10.   Time 8   Period Weeks   Status On-going     PT LONG TERM GOAL #5   Title Perform ADL's with pain not > 3/10.   Time 8   Period Weeks   Status Achieved               Plan - 09/13/16 1410    Clinical Impression Statement Patient tolerated today's treatment well with no reports of any pain increases today. Patient able to tolerate machine knee strengthening well with only multimodal cueing for proper technique. Patient had no stair ambulation deviations upon observation of forward step ups. Inflammation noted along patient's LLE especially along all sides of L patella and inflammation noted along L calf as well. Normal modalities response noted following removal of the modalities.   Rehab Potential Excellent   PT Frequency 3x / week   PT Duration 4 weeks   PT Treatment/Interventions ADLs/Self Care Home Management;Cryotherapy;Occupational psychologist;Therapeutic activities;Therapeutic exercise;Neuromuscular re-education;Patient/family education;Manual techniques;Passive range of motion;Vasopneumatic Device   PT Next Visit Plan Continue with TKR protocol for L knee with modalities for pain/edema per MPT POC. MD note required for MD visit 09/16/2016.   Consulted and Agree with Plan of Care Patient      Patient will benefit from skilled therapeutic intervention in order to improve the following deficits and impairments:  Pain, Increased edema, Decreased strength, Decreased range of motion, Abnormal gait  Visit Diagnosis: Chronic pain of left knee  Stiffness of left knee, not elsewhere classified  Localized edema     Problem List Patient Active Problem List   Diagnosis Date Noted  . OA (osteoarthritis) of knee 08/08/2016  . Leukocytosis 04/29/2015  . Abdominal pain, left lower quadrant 04/29/2015  . Acute diverticulitis 04/27/2015  . Essential hypertension 04/27/2015    Wynelle Fanny, PTA 09/13/2016, 3:10  PM  [facility] [place], [place], [address] Phone: [phone]   Fax:  [phone]  [name] MRN: [id number] Date of Birth: 1940-12-06

## 2016-09-15 ENCOUNTER — Ambulatory Visit: Payer: Medicare Other | Admitting: *Deleted

## 2016-09-15 DIAGNOSIS — M25662 Stiffness of left knee, not elsewhere classified: Secondary | ICD-10-CM | POA: Diagnosis not present

## 2016-09-15 DIAGNOSIS — G8929 Other chronic pain: Secondary | ICD-10-CM | POA: Diagnosis not present

## 2016-09-15 DIAGNOSIS — R600 Localized edema: Secondary | ICD-10-CM | POA: Diagnosis not present

## 2016-09-15 DIAGNOSIS — M25562 Pain in left knee: Principal | ICD-10-CM

## 2016-09-15 NOTE — Therapy (Addendum)
[facility] [place], [place], [address] Phone: [phone]   Fax:  [phone]  Physical Therapy Treatment  Patient Details  [name] MRN: [id number] Date of Birth: [birth year] Referring Provider: Gaynelle Arabian MD  Encounter Date: 09/15/2016    Past Medical History:  Diagnosis Date  . Arthritis   . Cancer (HCC)    hx of skin cancer   . Complication of anesthesia   . Essential hypertension   . GERD (gastroesophageal reflux disease)   . History of hiatal hernia   . PONV (postoperative nausea and vomiting)    per pt "slow to wake, difficult to put to sleep"     Past Surgical History:  Procedure Laterality Date  . BILATERAL SHOULDER SURGERY    . COLONOSCOPY    . EYE SURGERY     cataract extractions   . eyebrow surgery     . RIGHT FOOT SURGERY    . 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                                 PT Short Term Goals - 08/31/16 1350      PT SHORT TERM GOAL #1   Title Full active left knee extension in order to normalize gait.   Status On-going  AROM -4 deg from neutral 08/31/2016           PT Long Term Goals - 08/25/16 1520      PT LONG TERM GOAL #1   Title Independent with a HEP.   Time 8   Period Weeks   Status Achieved     PT LONG TERM GOAL #2   Title Active right knee flexion to 115 degrees+ so the patient can perform functional tasks and do so with pain not > 2-3/10.   Time 8   Period Weeks   Status Achieved  AROM R knee 115 deg flexion 08/18/2016     PT LONG TERM GOAL #3   Title Increase knee strength to a solid 5/5 to provide good stability for accomplishment of functional activities   Time 8   Period Weeks   Status  On-going     PT LONG TERM GOAL #4   Title Perform a reciprocating stair gait with one railing with pain not > 2-3/10.   Time 8   Period Weeks   Status On-going     PT LONG TERM GOAL #5   Title Perform ADL's with pain not > 3/10.   Time 8   Period Weeks   Status Achieved             Patient will benefit from skilled therapeutic intervention in order to improve the following deficits and impairments:  Pain, Increased edema, Decreased strength, Decreased range of motion, Abnormal gait  Visit Diagnosis: Chronic pain of left knee  Stiffness of left knee, not elsewhere classified  Localized edema     Problem List Patient Active Problem List   Diagnosis Date Noted  . OA (osteoarthritis) of knee 08/08/2016  . Leukocytosis 04/29/2015  . Abdominal pain, left lower quadrant 04/29/2015  . Acute diverticulitis 04/27/2015  . Essential hypertension 04/27/2015    Mali Applegate MPT  [facility] [place], [place], [address] Phone: [phone]   Fax:  [phone]  [name] MRN: [id number] Date of Birth: 15-Apr-1941

## 2016-09-16 DIAGNOSIS — Z471 Aftercare following joint replacement surgery: Secondary | ICD-10-CM | POA: Diagnosis not present

## 2016-09-16 DIAGNOSIS — Z96652 Presence of left artificial knee joint: Secondary | ICD-10-CM | POA: Diagnosis not present

## 2016-09-21 ENCOUNTER — Encounter: Payer: Self-pay | Admitting: Physical Therapy

## 2016-09-21 ENCOUNTER — Ambulatory Visit: Payer: Medicare Other | Admitting: Physical Therapy

## 2016-09-21 DIAGNOSIS — M25562 Pain in left knee: Secondary | ICD-10-CM | POA: Diagnosis not present

## 2016-09-21 DIAGNOSIS — M25662 Stiffness of left knee, not elsewhere classified: Secondary | ICD-10-CM

## 2016-09-21 DIAGNOSIS — R600 Localized edema: Secondary | ICD-10-CM

## 2016-09-21 DIAGNOSIS — G8929 Other chronic pain: Secondary | ICD-10-CM

## 2016-09-21 NOTE — Therapy (Signed)
[facility] [place], [place], [address] Phone: [phone]   Fax:  [phone]  Physical Therapy Treatment  Patient Details  [name] MRN: [id number] Date of Birth: 1940/10/12 Referring Provider: Gaynelle Arabian MD  Encounter Date: 09/21/2016      PT End of Session - 09/21/16 1434    Visit Number 16   Number of Visits 16   Date for PT Re-Evaluation 11/08/16   PT Start Time 4970   PT Stop Time 1532   PT Time Calculation (min) 60 min   Activity Tolerance Patient tolerated treatment well   Behavior During Therapy [facility] for tasks assessed/performed      Past Medical History:  Diagnosis Date  . Arthritis   . Cancer (HCC)    hx of skin cancer   . Complication of anesthesia   . Essential hypertension   . GERD (gastroesophageal reflux disease)   . History of hiatal hernia   . PONV (postoperative nausea and vomiting)    per pt "slow to wake, difficult to put to sleep"     Past Surgical History:  Procedure Laterality Date  . BILATERAL SHOULDER SURGERY    . COLONOSCOPY    . EYE SURGERY     cataract extractions   . eyebrow surgery     . RIGHT FOOT SURGERY    . 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      Subjective Assessment - 09/21/16 1432    Subjective Reports that Dr. Maureen Ralphs wishes for patient to continue PT for 2 more weeks. Reports stiffness in L knee that loosened as he continued stationary bike.   Patient Stated Goals Get out of pain and back to normal.   Currently in Pain? No/denies            [facility] PT Assessment - 09/21/16 0001      Assessment   Medical Diagnosis Left total knee replacement.   Onset Date/Surgical Date 08/08/16   Next MD Visit "6 weeks"     Restrictions   Weight  Bearing Restrictions No     ROM / Strength   AROM / PROM / Strength AROM     AROM   Overall AROM  Within functional limits for tasks performed   AROM Assessment Site Knee   Right/Left Knee Left   Left Knee Extension -5   Left Knee Flexion 130                     [facility] Adult PT Treatment/Exercise - 09/21/16 0001      Knee/Hip Exercises: Aerobic   Stationary Bike L2 x10 min     Knee/Hip Exercises: Machines for Strengthening   Cybex Knee Extension 20# 3x15 reps   Cybex Knee Flexion 30# 3x15 reps   Cybex Leg Press 2 pl, seat 8-7, 3x15 reps     Knee/Hip Exercises: Standing   Terminal Knee Extension Limitations Green theraband LLE x20 reps   Step Down Left;2 sets;10 reps;Hand Hold: 2;Step Height: 4"  Heel dot     Knee/Hip Exercises: Supine   Straight Leg Raise with External Rotation AROM;Left;3 sets;10 reps     Knee/Hip Exercises: Sidelying   Hip ABduction AROM;Left;3 sets;10 reps     Modalities   Modalities Electrical Stimulation;Vasopneumatic  Acupuncturist Location L knee   Chartered certified accountant IFC   Electrical Stimulation Parameters 1-10 hz x15 min   Electrical Stimulation Goals Edema;Pain     Vasopneumatic   Number Minutes Vasopneumatic  15 minutes   Vasopnuematic Location  Knee   Vasopneumatic Pressure Medium   Vasopneumatic Temperature  34                  PT Short Term Goals - 09/21/16 1528      PT SHORT TERM GOAL #1   Title Full active left knee extension in order to normalize gait.   Status On-going  AROM -5 deg from neutral 09/21/2016           PT Long Term Goals - 08/25/16 1520      PT LONG TERM GOAL #1   Title Independent with a HEP.   Time 8   Period Weeks   Status Achieved     PT LONG TERM GOAL #2   Title Active right knee flexion to 115 degrees+ so the patient can perform functional tasks and do so with pain not > 2-3/10.   Time 8   Period Weeks   Status Achieved   AROM R knee 115 deg flexion 08/18/2016     PT LONG TERM GOAL #3   Title Increase knee strength to a solid 5/5 to provide good stability for accomplishment of functional activities   Time 8   Period Weeks   Status On-going     PT LONG TERM GOAL #4   Title Perform a reciprocating stair gait with one railing with pain not > 2-3/10.   Time 8   Period Weeks   Status On-going     PT LONG TERM GOAL #5   Title Perform ADL's with pain not > 3/10.   Time 8   Period Weeks   Status Achieved               Plan - 09/21/16 1525    Clinical Impression Statement Patient tolerated today's treatment well as he was able to perform greater reps and new exercises without complaint. Fairly good technique performed by patient with TKE and patient encouraged to hold quad activation at end range TKE. L heel dot also performed to further improve quad activation and strength. AROM of L knee measured as 5-130 deg which caused patient to reflect that he needed to complete extension stretch more at home. Edema present around R patella upon comparison to R knee. Normal modalities response noted following removal of the modalities.   Rehab Potential Excellent   PT Frequency 3x / week   PT Duration 4 weeks   PT Treatment/Interventions ADLs/Self Care Home Management;Cryotherapy;Occupational psychologist;Therapeutic activities;Therapeutic exercise;Neuromuscular re-education;Patient/family education;Manual techniques;Passive range of motion;Vasopneumatic Device   PT Next Visit Plan Continue with TKR protocol for L knee with modalities for pain/edema per MPT POC.    Consulted and Agree with Plan of Care Patient      Patient will benefit from skilled therapeutic intervention in order to improve the following deficits and impairments:  Pain, Increased edema, Decreased strength, Decreased range of motion, Abnormal gait  Visit Diagnosis: Chronic pain of left knee  Stiffness of left  knee, not elsewhere classified  Localized edema     Problem List Patient Active Problem List   Diagnosis Date Noted  . OA (osteoarthritis) of knee 08/08/2016  . Leukocytosis 04/29/2015  . Abdominal pain, left lower quadrant 04/29/2015  .  Acute diverticulitis 04/27/2015  . Essential hypertension 04/27/2015    Ahmed Prima, PTA 09/21/16 3:35 PM  [facility] [address] [place], [place], [address] Phone: [phone]   Fax:  [phone]  Name: DOVBER ERNEST MRN: [id number] Date of Birth: May 17, 1940

## 2016-09-22 ENCOUNTER — Encounter: Payer: Self-pay | Admitting: Physical Therapy

## 2016-09-22 ENCOUNTER — Ambulatory Visit: Payer: Medicare Other | Admitting: Physical Therapy

## 2016-09-22 DIAGNOSIS — G8929 Other chronic pain: Secondary | ICD-10-CM | POA: Diagnosis not present

## 2016-09-22 DIAGNOSIS — M25662 Stiffness of left knee, not elsewhere classified: Secondary | ICD-10-CM

## 2016-09-22 DIAGNOSIS — R600 Localized edema: Secondary | ICD-10-CM

## 2016-09-22 DIAGNOSIS — M25562 Pain in left knee: Principal | ICD-10-CM

## 2016-09-22 NOTE — Therapy (Signed)
[facility] [place], [place], [address] Phone: [phone]   Fax:  [phone]  Physical Therapy Treatment  Patient Details  [name] MRN: [id number] Date of Birth: 01/02/1941 Referring Provider: Gaynelle Arabian MD  Encounter Date: 09/22/2016      PT End of Session - 09/22/16 1444    Visit Number 17   Date for PT Re-Evaluation 11/08/16   PT Start Time 1416   PT Stop Time 1500   PT Time Calculation (min) 44 min   Activity Tolerance Patient tolerated treatment well   Behavior During Therapy [facility] for tasks assessed/performed      Past Medical History:  Diagnosis Date  . Arthritis   . Cancer (HCC)    hx of skin cancer   . Complication of anesthesia   . Essential hypertension   . GERD (gastroesophageal reflux disease)   . History of hiatal hernia   . PONV (postoperative nausea and vomiting)    per pt "slow to wake, difficult to put to sleep"     Past Surgical History:  Procedure Laterality Date  . BILATERAL SHOULDER SURGERY    . COLONOSCOPY    . EYE SURGERY     cataract extractions   . eyebrow surgery     . RIGHT FOOT SURGERY    . 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      Subjective Assessment - 09/22/16 1419    Subjective No complaints after last treatment   Patient Stated Goals Get out of pain and back to normal.   Currently in Pain? No/denies                         [facility] Adult PT Treatment/Exercise - 09/22/16 0001      Knee/Hip Exercises: Aerobic   Stationary Bike L2 x10 min     Knee/Hip Exercises: Standing   Terminal Knee Extension Limitations Green theraband LLE x20 reps with glut squeeze     Acupuncturist Location L knee   Electrical Stimulation Action IFC   Electrical Stimulation Parameters 1-10hz x86min   Electrical Stimulation Goals Edema;Pain     Vasopneumatic   Number Minutes Vasopneumatic  15 minutes   Vasopnuematic Location  Knee   Vasopneumatic Pressure Medium     Manual Therapy   Manual Therapy Passive ROM   Passive ROM Passive extension stretching with low load holds                  PT Short Term Goals - 09/21/16 1528      PT SHORT TERM GOAL #1   Title Full active left knee extension in order to normalize gait.   Status On-going  AROM -5 deg from neutral 09/21/2016           PT Long Term Goals - 08/25/16 1520      PT LONG TERM GOAL #1   Title Independent with a HEP.   Time 8   Period Weeks   Status Achieved     PT LONG TERM GOAL #2   Title Active right knee flexion to 115 degrees+ so the patient can perform functional tasks and do so with pain not > 2-3/10.   Time 8  Period Weeks   Status Achieved  AROM R knee 115 deg flexion 08/18/2016     PT LONG TERM GOAL #3   Title Increase knee strength to a solid 5/5 to provide good stability for accomplishment of functional activities   Time 8   Period Weeks   Status On-going     PT LONG TERM GOAL #4   Title Perform a reciprocating stair gait with one railing with pain not > 2-3/10.   Time 8   Period Weeks   Status On-going     PT LONG TERM GOAL #5   Title Perform ADL's with pain not > 3/10.   Time 8   Period Weeks   Status Achieved               Plan - 09/22/16 1446    Clinical Impression Statement Patient tolerated treatment well today and is progressing with strength and ROM. Patient is independent with HEP and self stretches doing them 2-3 times daily. Patient remaining goals ongoing due to ext deficts and strength limitations.    Rehab Potential Excellent   PT Frequency 3x / week   PT Duration 4 weeks   PT Treatment/Interventions ADLs/Self Care Home Management;Cryotherapy;Manufacturing engineer;Therapeutic activities;Therapeutic exercise;Neuromuscular re-education;Patient/family education;Manual techniques;Passive range of motion;Vasopneumatic Device   PT Next Visit Plan Continue with TKR protocol for L knee with modalities for pain/edema per MPT POC. /DC next week per patient   Consulted and Agree with Plan of Care Patient      Patient will benefit from skilled therapeutic intervention in order to improve the following deficits and impairments:  Pain, Increased edema, Decreased strength, Decreased range of motion, Abnormal gait  Visit Diagnosis: Chronic pain of left knee  Stiffness of left knee, not elsewhere classified  Localized edema     Problem List Patient Active Problem List   Diagnosis Date Noted  . OA (osteoarthritis) of knee 08/08/2016  . Leukocytosis 04/29/2015  . Abdominal pain, left lower quadrant 04/29/2015  . Acute diverticulitis 04/27/2015  . Essential hypertension 04/27/2015    Phillips Climes, PTA 09/22/2016, 3:16 PM  [facility] [address] [place], [place], [address] Phone: [phone]   Fax:  [phone]  [name] MRN: [id number] Date of Birth: February 07, 1941

## 2016-09-26 ENCOUNTER — Encounter: Payer: Self-pay | Admitting: Physical Therapy

## 2016-09-26 ENCOUNTER — Ambulatory Visit: Payer: Medicare Other | Admitting: Physical Therapy

## 2016-09-26 DIAGNOSIS — G8929 Other chronic pain: Secondary | ICD-10-CM | POA: Diagnosis not present

## 2016-09-26 DIAGNOSIS — R600 Localized edema: Secondary | ICD-10-CM | POA: Diagnosis not present

## 2016-09-26 DIAGNOSIS — M25562 Pain in left knee: Secondary | ICD-10-CM | POA: Diagnosis not present

## 2016-09-26 DIAGNOSIS — M25662 Stiffness of left knee, not elsewhere classified: Secondary | ICD-10-CM

## 2016-09-26 NOTE — Therapy (Signed)
[facility] [place], [place], [address] Phone: [phone]   Fax:  [phone]  Physical Therapy Treatment  Patient Details  [name] MRN: [id number] Date of Birth: 1940-11-26 Referring Provider: Gaynelle Arabian MD  Encounter Date: 09/26/2016      PT End of Session - 09/26/16 1456    Visit Number 18   Number of Visits 21   Date for PT Re-Evaluation 11/08/16   PT Start Time 7510   PT Stop Time 1530   PT Time Calculation (min) 41 min   Activity Tolerance Patient tolerated treatment well   Behavior During Therapy [facility] for tasks assessed/performed      Past Medical History:  Diagnosis Date  . Arthritis   . Cancer (HCC)    hx of skin cancer   . Complication of anesthesia   . Essential hypertension   . GERD (gastroesophageal reflux disease)   . History of hiatal hernia   . PONV (postoperative nausea and vomiting)    per pt "slow to wake, difficult to put to sleep"     Past Surgical History:  Procedure Laterality Date  . BILATERAL SHOULDER SURGERY    . COLONOSCOPY    . EYE SURGERY     cataract extractions   . eyebrow surgery     . RIGHT FOOT SURGERY    . 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      Subjective Assessment - 09/26/16 1451    Subjective No complaints after last treatment   Patient Stated Goals Get out of pain and back to normal.   Currently in Pain? Yes   Pain Score 1    Pain Location Knee   Pain Orientation Left   Pain Descriptors / Indicators Discomfort   Pain Type Surgical pain   Pain Onset More than a month ago   Pain Frequency Intermittent   Aggravating Factors  stretching   Pain Relieving Factors rest            [facility] PT Assessment - 09/26/16 0001      ROM /  Strength   AROM / PROM / Strength AROM;Strength     AROM   AROM Assessment Site Knee   Right/Left Knee Left   Left Knee Extension -5     Strength   Strength Assessment Site Knee;Hip   Right/Left Hip Left   Left Hip ABduction 5/5   Right/Left Knee Left   Left Knee Flexion 5/5   Left Knee Extension 5/5                     [facility] Adult PT Treatment/Exercise - 09/26/16 0001      Knee/Hip Exercises: Aerobic   Stationary Bike L2 x10 min     Knee/Hip Exercises: Standing   Terminal Knee Extension Limitations Green theraband LLE x20 reps with glut squeeze     Acupuncturist Location L knee   Electrical Stimulation Action IFC   Electrical Stimulation Parameters 1-'10hz' x40mn   Electrical Stimulation Goals Edema;Pain     Vasopneumatic   Number Minutes Vasopneumatic  15 minutes   Vasopnuematic Location  Knee   Vasopneumatic Pressure Medium     Manual Therapy   Manual Therapy Passive ROM   Passive  ROM Passive extension stretching with low load holds                  PT Short Term Goals - 09/21/16 1528      PT SHORT TERM GOAL #1   Title Full active left knee extension in order to normalize gait.   Status On-going  AROM -5 deg from neutral 09/21/2016           PT Long Term Goals - 09/26/16 1523      PT LONG TERM GOAL #1   Title Independent with a HEP.   Time 8   Period Weeks   Status Achieved     PT LONG TERM GOAL #2   Title Active right knee flexion to 115 degrees+ so the patient can perform functional tasks and do so with pain not > 2-3/10.   Time 8   Period Weeks   Status Achieved     PT LONG TERM GOAL #3   Title Increase knee strength to a solid 5/5 to provide good stability for accomplishment of functional activities   Time 8   Period Weeks   Status Achieved  5/5 left knee and hip abd 09/26/16     PT LONG TERM GOAL #4   Title Perform a reciprocating stair gait with one railing with pain not > 2-3/10.    Time 8   Period Weeks   Status Achieved  able to perform reciprocating stair gait no UE support 09/26/16     PT LONG TERM GOAL #5   Title Perform ADL's with pain not > 3/10.   Time 8   Period Weeks   Status Achieved               Plan - 09/26/16 1524    Clinical Impression Statement Patient tolerated treatment well today and met all LTG's. Patient independent with all exercises and HEP self stretches. Patient only remaining defict is AROM-5 degrees left knee ext.   Rehab Potential Excellent   PT Frequency 3x / week   PT Duration 4 weeks   PT Treatment/Interventions ADLs/Self Care Home Management;Cryotherapy;Occupational psychologist;Therapeutic activities;Therapeutic exercise;Neuromuscular re-education;Patient/family education;Manual techniques;Passive range of motion;Vasopneumatic Device   PT Next Visit Plan DC this week per patient 2 visits (KX modifier)   Consulted and Agree with Plan of Care Patient      Patient will benefit from skilled therapeutic intervention in order to improve the following deficits and impairments:  Pain, Increased edema, Decreased strength, Decreased range of motion, Abnormal gait  Visit Diagnosis: Chronic pain of left knee  Stiffness of left knee, not elsewhere classified  Localized edema     Problem List Patient Active Problem List   Diagnosis Date Noted  . OA (osteoarthritis) of knee 08/08/2016  . Leukocytosis 04/29/2015  . Abdominal pain, left lower quadrant 04/29/2015  . Acute diverticulitis 04/27/2015  . Essential hypertension 04/27/2015    Phillips Climes, PTA 09/26/2016, 3:31 PM  [facility] [address] [place], [place], [address] Phone: [phone]   Fax:  [phone]  [name] MRN: [id number] Date of Birth: Sep 08, 1940

## 2016-09-27 ENCOUNTER — Ambulatory Visit: Payer: Medicare Other | Admitting: Physical Therapy

## 2016-09-29 ENCOUNTER — Encounter: Payer: Medicare Other | Admitting: Physical Therapy

## 2016-10-04 ENCOUNTER — Ambulatory Visit: Payer: Medicare Other | Admitting: Physical Therapy

## 2016-10-04 DIAGNOSIS — M25662 Stiffness of left knee, not elsewhere classified: Secondary | ICD-10-CM

## 2016-10-04 DIAGNOSIS — G8929 Other chronic pain: Secondary | ICD-10-CM

## 2016-10-04 DIAGNOSIS — R600 Localized edema: Secondary | ICD-10-CM

## 2016-10-04 DIAGNOSIS — M25562 Pain in left knee: Principal | ICD-10-CM

## 2016-10-04 NOTE — Therapy (Signed)
[facility] [place], [place], [address] Phone: [phone]   Fax:  [phone]  Physical Therapy Treatment  Patient Details  [name] MRN: [id number] Date of Birth: 01/20/1941 Referring Provider: Gaynelle Arabian MD  Encounter Date: 10/04/2016      PT End of Session - 10/04/16 1552    Visit Number 19   Number of Visits 21   Date for PT Re-Evaluation 11/08/16   PT Start Time 0148   PT Stop Time 0240   PT Time Calculation (min) 52 min   Activity Tolerance Patient tolerated treatment well   Behavior During Therapy [facility] for tasks assessed/performed      Past Medical History:  Diagnosis Date  . Arthritis   . Cancer (HCC)    hx of skin cancer   . Complication of anesthesia   . Essential hypertension   . GERD (gastroesophageal reflux disease)   . History of hiatal hernia   . PONV (postoperative nausea and vomiting)    per pt "slow to wake, difficult to put to sleep"     Past Surgical History:  Procedure Laterality Date  . BILATERAL SHOULDER SURGERY    . COLONOSCOPY    . EYE SURGERY     cataract extractions   . eyebrow surgery     . RIGHT FOOT SURGERY    . 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      Subjective Assessment - 10/04/16 1550    Subjective Hope to get my knee straight.   Patient Stated Goals Get out of pain and back to normal.   Pain Score 1    Pain Location Knee   Pain Orientation Left   Pain Descriptors / Indicators Discomfort   Pain Type Surgical pain   Pain Onset More than a month ago                         [facility] Adult PT Treatment/Exercise - 10/04/16 0001      Exercises   Exercises Knee/Hip     Knee/Hip Exercises: Aerobic   Stationary Bike Level 2 x  20 minutes.     Modalities   Modalities Location manager Stimulation Location Left knee.   Electrical Stimulation Action IFC   Electrical Stimulation Parameters 80-150 Hz x 15 minutes.   Electrical Stimulation Goals Edema;Pain     Vasopneumatic   Number Minutes Vasopneumatic  15 minutes   Vasopnuematic Location  --  Left knee.   Vasopneumatic Pressure Medium     Manual Therapy   Manual Therapy Passive ROM   Passive ROM 3 minutes of overpressure stretching which included 3 reps of 10 counts to patient's tolerance.                  PT Short Term Goals - 09/21/16 1528      PT SHORT TERM GOAL #1   Title Full active left knee extension in order to normalize gait.   Status On-going  AROM -5 deg from neutral 09/21/2016           PT Long Term Goals - 09/26/16 1523      PT LONG TERM GOAL #1   Title Independent  with a HEP.   Time 8   Period Weeks   Status Achieved     PT LONG TERM GOAL #2   Title Active right knee flexion to 115 degrees+ so the patient can perform functional tasks and do so with pain not > 2-3/10.   Time 8   Period Weeks   Status Achieved     PT LONG TERM GOAL #3   Title Increase knee strength to a solid 5/5 to provide good stability for accomplishment of functional activities   Time 8   Period Weeks   Status Achieved  5/5 left knee and hip abd 09/26/16     PT LONG TERM GOAL #4   Title Perform a reciprocating stair gait with one railing with pain not > 2-3/10.   Time 8   Period Weeks   Status Achieved  able to perform reciprocating stair gait no UE support 09/26/16     PT LONG TERM GOAL #5   Title Perform ADL's with pain not > 3/10.   Time 8   Period Weeks   Status Achieved               Plan - 10/04/16 1553    Clinical Impression Statement Excellent job with overpressure stretching today to increase left knee extension.      Patient will benefit from skilled  therapeutic intervention in order to improve the following deficits and impairments:     Visit Diagnosis: Chronic pain of left knee  Stiffness of left knee, not elsewhere classified  Localized edema     Problem List Patient Active Problem List   Diagnosis Date Noted  . OA (osteoarthritis) of knee 08/08/2016  . Leukocytosis 04/29/2015  . Abdominal pain, left lower quadrant 04/29/2015  . Acute diverticulitis 04/27/2015  . Essential hypertension 04/27/2015    Faige Seely, [place] MPT 10/04/2016, 3:54 PM  [facility] [address] [place], [place], [address] Phone: [phone]   Fax:  [phone]  [name] MRN: [id number] Date of Birth: 1940-07-08

## 2016-10-06 ENCOUNTER — Ambulatory Visit: Payer: Medicare Other | Admitting: *Deleted

## 2016-10-06 DIAGNOSIS — M25562 Pain in left knee: Principal | ICD-10-CM

## 2016-10-06 DIAGNOSIS — G8929 Other chronic pain: Secondary | ICD-10-CM

## 2016-10-06 DIAGNOSIS — R600 Localized edema: Secondary | ICD-10-CM | POA: Diagnosis not present

## 2016-10-06 DIAGNOSIS — M25662 Stiffness of left knee, not elsewhere classified: Secondary | ICD-10-CM | POA: Diagnosis not present

## 2016-10-06 NOTE — Therapy (Signed)
[facility] [place], [place], [address] Phone: [phone]   Fax:  [phone]  Physical Therapy Treatment  Patient Details  [name] MRN: [id number] Date of Birth: 19-Jun-1940 Referring Provider: Gaynelle Arabian MD  Encounter Date: 10/06/2016      PT End of Session - 10/06/16 1355    Visit Number 20   Number of Visits 21   Date for PT Re-Evaluation 11/08/16   PT Start Time 1638   PT Stop Time 1438   PT Time Calculation (min) 53 min      Past Medical History:  Diagnosis Date  . Arthritis   . Cancer (HCC)    hx of skin cancer   . Complication of anesthesia   . Essential hypertension   . GERD (gastroesophageal reflux disease)   . History of hiatal hernia   . PONV (postoperative nausea and vomiting)    per pt "slow to wake, difficult to put to sleep"     Past Surgical History:  Procedure Laterality Date  . BILATERAL SHOULDER SURGERY    . COLONOSCOPY    . EYE SURGERY     cataract extractions   . eyebrow surgery     . RIGHT FOOT SURGERY    . 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      Subjective Assessment - 10/06/16 1354    Subjective Hope to get my knee straight.   Patient Stated Goals Get out of pain and back to normal.   Currently in Pain? Yes   Pain Score 2    Pain Location Knee   Pain Orientation Left   Pain Onset More than a month ago   Pain Frequency Intermittent                         [facility] Adult PT Treatment/Exercise - 10/06/16 0001      Exercises   Exercises Knee/Hip     Knee/Hip Exercises: Aerobic   Stationary Bike Level  x 15 minutes.     Knee/Hip Exercises: Machines for Strengthening   Cybex Knee Extension 20# 3x15 reps     Modalities   Modalities Location manager Stimulation Location Left knee.  IFC x 15 mins  80-150 Hz   Electrical Stimulation Goals Edema;Pain     Vasopneumatic   Number Minutes Vasopneumatic  15 minutes   Vasopnuematic Location  Knee   Vasopneumatic Pressure Medium   Vasopneumatic Temperature  34     Manual Therapy   Manual Therapy Passive ROM   Passive ROM Passive extension stretching with low load holds                  PT Short Term Goals - 10/06/16 1428      PT SHORT TERM GOAL #1   Title Full active left knee extension in order to normalize gait.  NM -4 degrees   Status Not Met           PT Long Term Goals - 09/26/16 1523      PT LONG TERM GOAL #1   Title Independent with a HEP.   Time 8   Period Weeks   Status Achieved  PT LONG TERM GOAL #2   Title Active right knee flexion to 115 degrees+ so the patient can perform functional tasks and do so with pain not > 2-3/10.   Time 8   Period Weeks   Status Achieved     PT LONG TERM GOAL #3   Title Increase knee strength to a solid 5/5 to provide good stability for accomplishment of functional activities   Time 8   Period Weeks   Status Achieved  5/5 left knee and hip abd 09/26/16     PT LONG TERM GOAL #4   Title Perform a reciprocating stair gait with one railing with pain not > 2-3/10.   Time 8   Period Weeks   Status Achieved  able to perform reciprocating stair gait no UE support 09/26/16     PT LONG TERM GOAL #5   Title Perform ADL's with pain not > 3/10.   Time 8   Period Weeks   Status Achieved               Plan - 2016/10/16 1356    Clinical Impression Statement Pt did great today and has met all LTGs, but was unable to meet STG for extension due to tightness   Rehab Potential Excellent   PT Frequency 3x / week   PT Duration 4 weeks   PT Treatment/Interventions ADLs/Self Care Home Management;Cryotherapy;Psychologist, clinical;Therapeutic activities;Therapeutic exercise;Neuromuscular re-education;Patient/family education;Manual techniques;Passive range of motion;Vasopneumatic Device   PT Next Visit Plan DC today   Consulted and Agree with Plan of Care Patient      Patient will benefit from skilled therapeutic intervention in order to improve the following deficits and impairments:  Pain, Increased edema, Decreased strength, Decreased range of motion, Abnormal gait  Visit Diagnosis: Chronic pain of left knee  Stiffness of left knee, not elsewhere classified  Localized edema       G-Codes - October 16, 2016 1628    Functional Assessment Tool Used (Outpatient Only) DC Gcode 20th visit FOTO42% limited      Problem List Patient Active Problem List   Diagnosis Date Noted  . OA (osteoarthritis) of knee 08/08/2016  . Leukocytosis 04/29/2015  . Abdominal pain, left lower quadrant 04/29/2015  . Acute diverticulitis 04/27/2015  . Essential hypertension 04/27/2015    Daran Favaro,CHRIS, PTA October 16, 2016, 4:36 PM  [facility] [address] [place], [place], [address] Phone: [phone]   Fax:  [phone]  [name] MRN: [id number] Date of Birth: 05-30-1940  PHYSICAL THERAPY DISCHARGE SUMMARY  Visits from Start of Care: 20.  Current functional level related to goals / functional outcomes: See above.   Remaining deficits: All goals met.   Education / Equipment: HEP. Plan: Patient agrees to discharge.  Patient goals were met. Patient is being discharged due to meeting the stated rehab goals.  ?????          Mali Applegate MPT

## 2016-10-18 DIAGNOSIS — M170 Bilateral primary osteoarthritis of knee: Secondary | ICD-10-CM | POA: Diagnosis not present

## 2016-10-18 DIAGNOSIS — Z471 Aftercare following joint replacement surgery: Secondary | ICD-10-CM | POA: Diagnosis not present

## 2016-10-18 DIAGNOSIS — Z96652 Presence of left artificial knee joint: Secondary | ICD-10-CM | POA: Diagnosis not present

## 2016-10-19 DIAGNOSIS — M7989 Other specified soft tissue disorders: Secondary | ICD-10-CM | POA: Diagnosis not present

## 2016-10-24 DIAGNOSIS — R600 Localized edema: Secondary | ICD-10-CM | POA: Diagnosis not present

## 2016-10-24 DIAGNOSIS — I10 Essential (primary) hypertension: Secondary | ICD-10-CM | POA: Diagnosis not present

## 2017-01-16 DIAGNOSIS — Z91018 Allergy to other foods: Secondary | ICD-10-CM | POA: Diagnosis not present

## 2017-01-16 DIAGNOSIS — J3089 Other allergic rhinitis: Secondary | ICD-10-CM | POA: Diagnosis not present

## 2017-01-16 DIAGNOSIS — T783XXD Angioneurotic edema, subsequent encounter: Secondary | ICD-10-CM | POA: Diagnosis not present

## 2017-01-16 DIAGNOSIS — H1045 Other chronic allergic conjunctivitis: Secondary | ICD-10-CM | POA: Diagnosis not present

## 2017-01-19 DIAGNOSIS — I10 Essential (primary) hypertension: Secondary | ICD-10-CM | POA: Diagnosis not present

## 2017-01-19 DIAGNOSIS — R311 Benign essential microscopic hematuria: Secondary | ICD-10-CM | POA: Diagnosis not present

## 2017-01-26 ENCOUNTER — Other Ambulatory Visit: Payer: Self-pay

## 2017-01-26 ENCOUNTER — Ambulatory Visit (HOSPITAL_COMMUNITY)
Admission: RE | Admit: 2017-01-26 | Discharge: 2017-01-26 | Disposition: A | Discharge status: Home / Self Care | Payer: Medicare Other | Source: Ambulatory Visit | Attending: Vascular Surgery | Admitting: Vascular Surgery

## 2017-01-26 DIAGNOSIS — M7989 Other specified soft tissue disorders: Secondary | ICD-10-CM | POA: Diagnosis not present

## 2017-01-26 DIAGNOSIS — M150 Primary generalized (osteo)arthritis: Secondary | ICD-10-CM | POA: Diagnosis not present

## 2017-01-26 DIAGNOSIS — I10 Essential (primary) hypertension: Secondary | ICD-10-CM | POA: Diagnosis not present

## 2017-01-26 DIAGNOSIS — Z23 Encounter for immunization: Secondary | ICD-10-CM | POA: Diagnosis not present

## 2017-01-26 DIAGNOSIS — R311 Benign essential microscopic hematuria: Secondary | ICD-10-CM | POA: Diagnosis not present

## 2017-01-26 DIAGNOSIS — K5730 Diverticulosis of large intestine without perforation or abscess without bleeding: Secondary | ICD-10-CM | POA: Diagnosis not present

## 2017-02-07 DIAGNOSIS — Z1211 Encounter for screening for malignant neoplasm of colon: Secondary | ICD-10-CM | POA: Diagnosis not present

## 2017-02-07 DIAGNOSIS — Z1212 Encounter for screening for malignant neoplasm of rectum: Secondary | ICD-10-CM | POA: Diagnosis not present

## 2017-04-05 DIAGNOSIS — H04123 Dry eye syndrome of bilateral lacrimal glands: Secondary | ICD-10-CM | POA: Diagnosis not present

## 2017-04-05 DIAGNOSIS — H52223 Regular astigmatism, bilateral: Secondary | ICD-10-CM | POA: Diagnosis not present

## 2017-04-05 DIAGNOSIS — Z961 Presence of intraocular lens: Secondary | ICD-10-CM | POA: Diagnosis not present

## 2017-04-05 DIAGNOSIS — H524 Presbyopia: Secondary | ICD-10-CM | POA: Diagnosis not present

## 2017-04-05 DIAGNOSIS — H5213 Myopia, bilateral: Secondary | ICD-10-CM | POA: Diagnosis not present

## 2017-04-05 DIAGNOSIS — G43809 Other migraine, not intractable, without status migrainosus: Secondary | ICD-10-CM | POA: Diagnosis not present

## 2017-04-05 DIAGNOSIS — H26493 Other secondary cataract, bilateral: Secondary | ICD-10-CM | POA: Diagnosis not present

## 2017-04-14 DIAGNOSIS — Z23 Encounter for immunization: Secondary | ICD-10-CM | POA: Diagnosis not present

## 2017-04-26 DIAGNOSIS — M21611 Bunion of right foot: Secondary | ICD-10-CM | POA: Diagnosis not present

## 2017-04-26 DIAGNOSIS — M7741 Metatarsalgia, right foot: Secondary | ICD-10-CM | POA: Diagnosis not present

## 2017-04-26 DIAGNOSIS — M79671 Pain in right foot: Secondary | ICD-10-CM | POA: Diagnosis not present

## 2017-04-26 DIAGNOSIS — M2041 Other hammer toe(s) (acquired), right foot: Secondary | ICD-10-CM | POA: Diagnosis not present

## 2017-06-29 DIAGNOSIS — Z0000 Encounter for general adult medical examination without abnormal findings: Secondary | ICD-10-CM | POA: Diagnosis not present

## 2017-06-29 DIAGNOSIS — I10 Essential (primary) hypertension: Secondary | ICD-10-CM | POA: Diagnosis not present

## 2017-06-29 DIAGNOSIS — R311 Benign essential microscopic hematuria: Secondary | ICD-10-CM | POA: Diagnosis not present

## 2017-06-29 DIAGNOSIS — Z23 Encounter for immunization: Secondary | ICD-10-CM | POA: Diagnosis not present

## 2017-06-29 DIAGNOSIS — M150 Primary generalized (osteo)arthritis: Secondary | ICD-10-CM | POA: Diagnosis not present

## 2017-07-06 DIAGNOSIS — R7303 Prediabetes: Secondary | ICD-10-CM | POA: Diagnosis not present

## 2017-07-06 DIAGNOSIS — K5730 Diverticulosis of large intestine without perforation or abscess without bleeding: Secondary | ICD-10-CM | POA: Diagnosis not present

## 2017-07-06 DIAGNOSIS — M150 Primary generalized (osteo)arthritis: Secondary | ICD-10-CM | POA: Diagnosis not present

## 2017-07-06 DIAGNOSIS — I10 Essential (primary) hypertension: Secondary | ICD-10-CM | POA: Diagnosis not present

## 2017-07-06 DIAGNOSIS — R311 Benign essential microscopic hematuria: Secondary | ICD-10-CM | POA: Diagnosis not present

## 2017-07-13 DIAGNOSIS — M79672 Pain in left foot: Secondary | ICD-10-CM | POA: Diagnosis not present

## 2017-07-13 DIAGNOSIS — M7742 Metatarsalgia, left foot: Secondary | ICD-10-CM | POA: Diagnosis not present

## 2017-08-03 DIAGNOSIS — M7742 Metatarsalgia, left foot: Secondary | ICD-10-CM | POA: Diagnosis not present

## 2017-08-16 DIAGNOSIS — I10 Essential (primary) hypertension: Secondary | ICD-10-CM | POA: Diagnosis not present

## 2017-08-16 DIAGNOSIS — Z91048 Other nonmedicinal substance allergy status: Secondary | ICD-10-CM | POA: Diagnosis not present

## 2017-08-16 DIAGNOSIS — Z888 Allergy status to other drugs, medicaments and biological substances status: Secondary | ICD-10-CM | POA: Diagnosis not present

## 2017-08-16 DIAGNOSIS — J302 Other seasonal allergic rhinitis: Secondary | ICD-10-CM | POA: Diagnosis not present

## 2017-08-16 DIAGNOSIS — M1990 Unspecified osteoarthritis, unspecified site: Secondary | ICD-10-CM | POA: Diagnosis not present

## 2017-08-16 DIAGNOSIS — G5761 Lesion of plantar nerve, right lower limb: Secondary | ICD-10-CM | POA: Diagnosis not present

## 2017-08-16 DIAGNOSIS — L84 Corns and callosities: Secondary | ICD-10-CM | POA: Diagnosis not present

## 2017-08-16 DIAGNOSIS — Z881 Allergy status to other antibiotic agents status: Secondary | ICD-10-CM | POA: Diagnosis not present

## 2017-08-16 DIAGNOSIS — Z87891 Personal history of nicotine dependence: Secondary | ICD-10-CM | POA: Diagnosis not present

## 2017-08-16 DIAGNOSIS — R7303 Prediabetes: Secondary | ICD-10-CM | POA: Diagnosis not present

## 2017-08-16 DIAGNOSIS — Z79899 Other long term (current) drug therapy: Secondary | ICD-10-CM | POA: Diagnosis not present

## 2017-08-16 DIAGNOSIS — K219 Gastro-esophageal reflux disease without esophagitis: Secondary | ICD-10-CM | POA: Diagnosis not present

## 2017-08-16 DIAGNOSIS — M79674 Pain in right toe(s): Secondary | ICD-10-CM | POA: Diagnosis not present

## 2017-08-16 DIAGNOSIS — M2041 Other hammer toe(s) (acquired), right foot: Secondary | ICD-10-CM | POA: Diagnosis not present

## 2017-08-17 DIAGNOSIS — L84 Corns and callosities: Secondary | ICD-10-CM | POA: Diagnosis not present

## 2017-08-17 DIAGNOSIS — I10 Essential (primary) hypertension: Secondary | ICD-10-CM | POA: Diagnosis not present

## 2017-08-17 DIAGNOSIS — M79674 Pain in right toe(s): Secondary | ICD-10-CM | POA: Diagnosis not present

## 2017-08-17 DIAGNOSIS — J302 Other seasonal allergic rhinitis: Secondary | ICD-10-CM | POA: Diagnosis not present

## 2017-08-17 DIAGNOSIS — M2041 Other hammer toe(s) (acquired), right foot: Secondary | ICD-10-CM | POA: Diagnosis not present

## 2017-08-17 DIAGNOSIS — E119 Type 2 diabetes mellitus without complications: Secondary | ICD-10-CM | POA: Diagnosis not present

## 2017-08-17 DIAGNOSIS — G5761 Lesion of plantar nerve, right lower limb: Secondary | ICD-10-CM | POA: Diagnosis not present

## 2017-08-29 DIAGNOSIS — M7742 Metatarsalgia, left foot: Secondary | ICD-10-CM | POA: Diagnosis not present

## 2017-10-21 IMAGING — CT CT ABD-PELV W/ CM
2 of 5 series · 16 of 46 positions shown, 18 images · IV contrast (iopamidol)
Comparison: CT, 08/18/2015

CLINICAL DATA: Left lower quadrant pain with history of recurrent
diverticulitis.

EXAM:
CT ABDOMEN AND PELVIS WITH CONTRAST
TECHNIQUE: Multidetector CT imaging of the abdomen and pelvis was performed
using the standard protocol following bolus administration of
intravenous contrast.
CONTRAST:  100mL [id number] IOPAMIDOL ([id number]) INJECTION 61%

[Series 2: abd/pel with · axial · 0.83mm/px · z∈[+1029,+1429]mm · 13 of 94 slices shown, 15 images]
[im 7/94  soft-tissue]
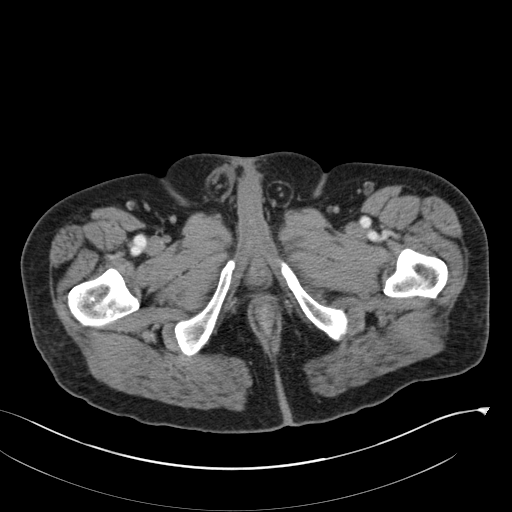
[im 7/94  bone]
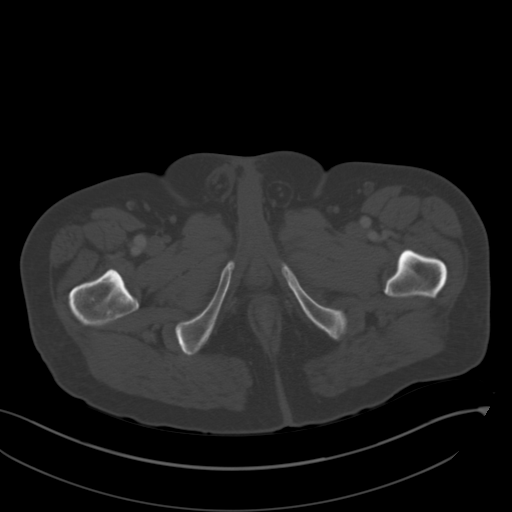
[im 14/94  soft-tissue]
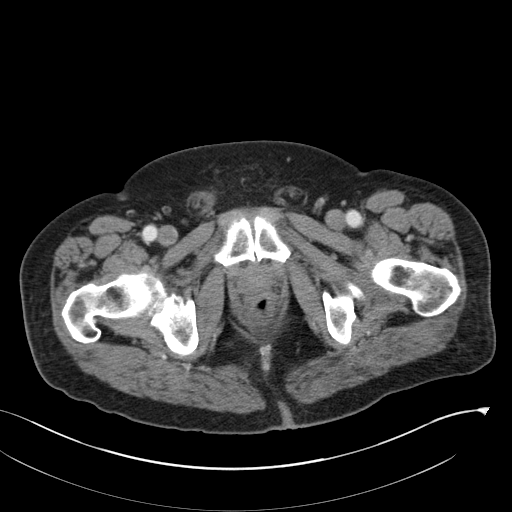
[im 20/94  soft-tissue]
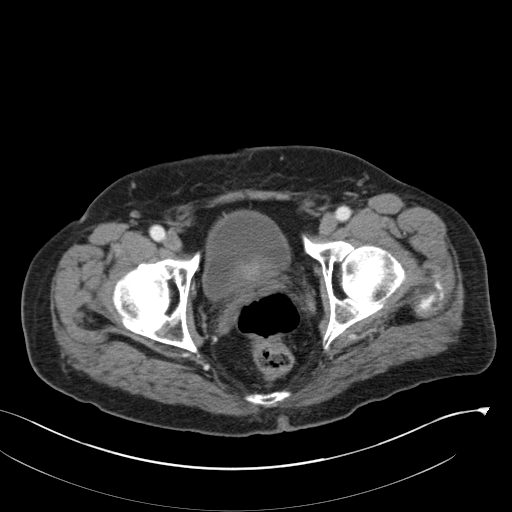
[im 27/94  soft-tissue]
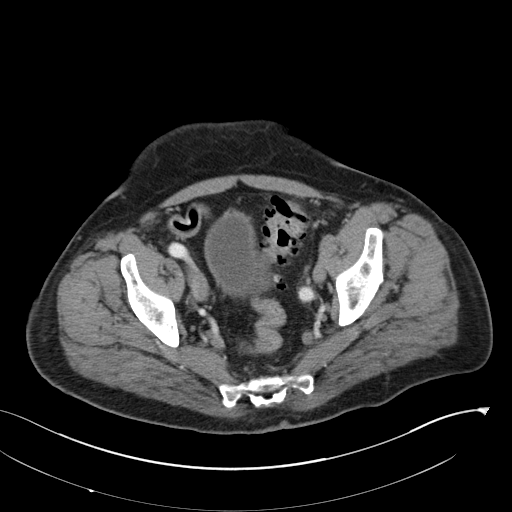
[im 34/94  soft-tissue]
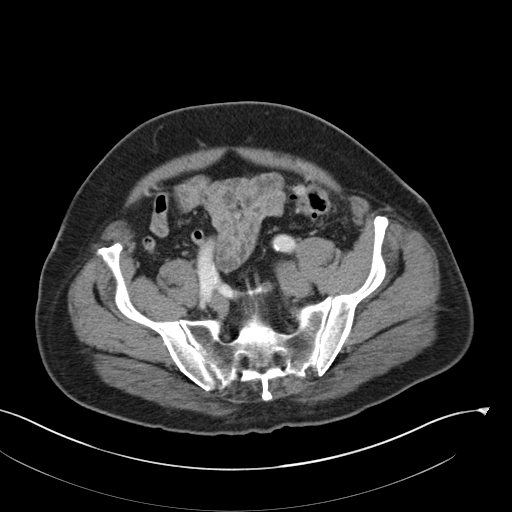
[im 40/94  soft-tissue]
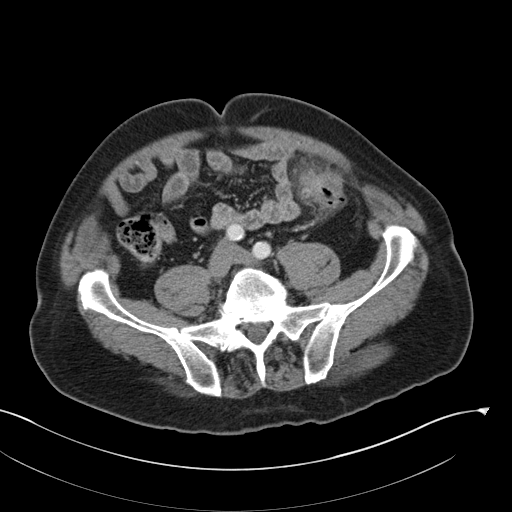
[im 47/94  soft-tissue]
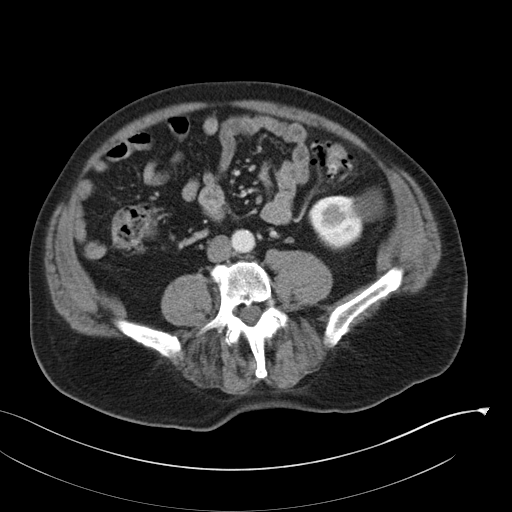
[im 54/94  soft-tissue]
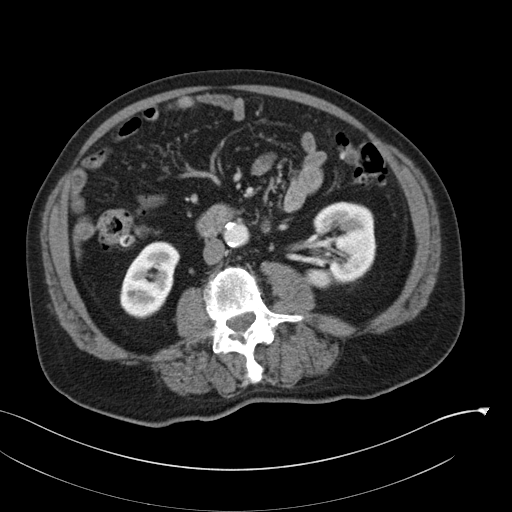
[im 60/94  soft-tissue]
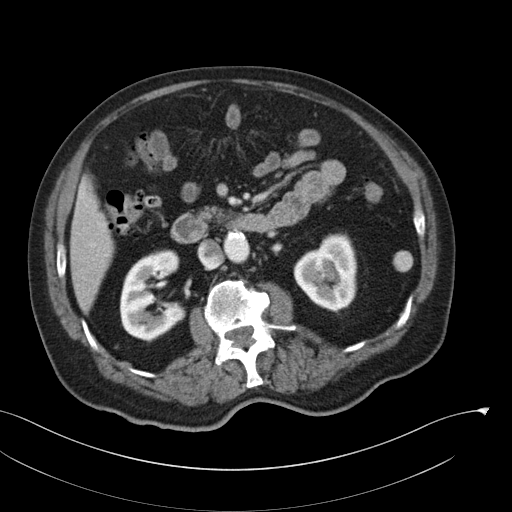
[im 60/94  bone]
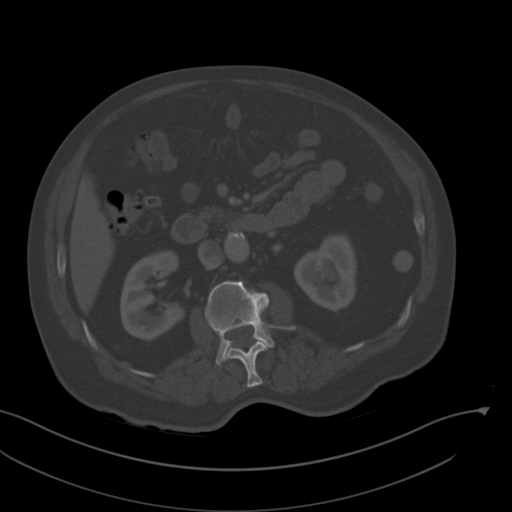
[im 67/94  soft-tissue]
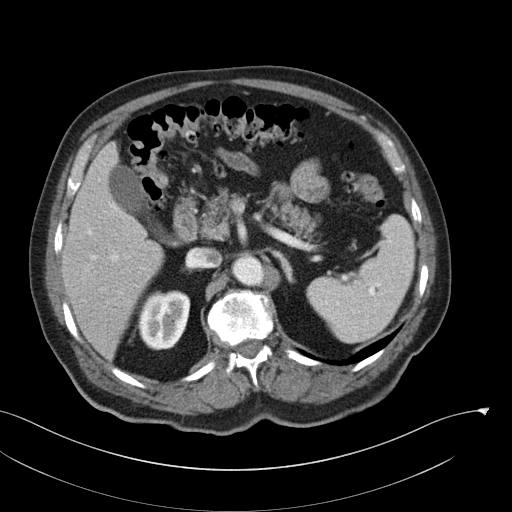
[im 74/94  soft-tissue]
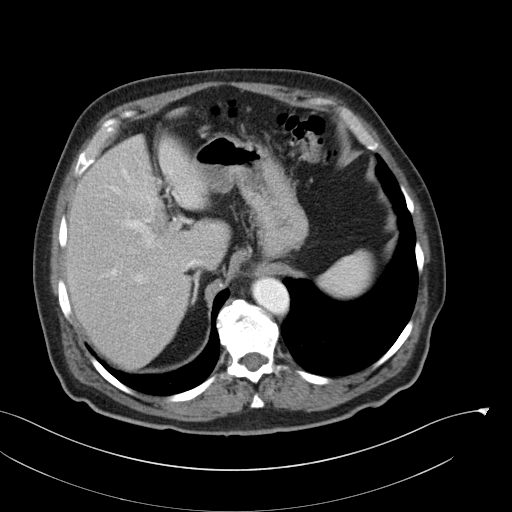
[im 80/94  soft-tissue]
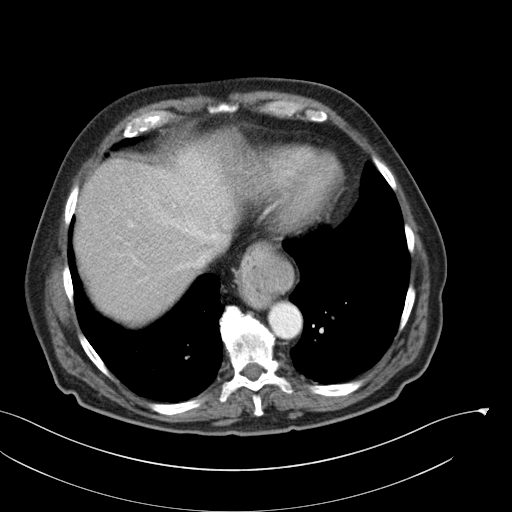
[im 87/94  soft-tissue]
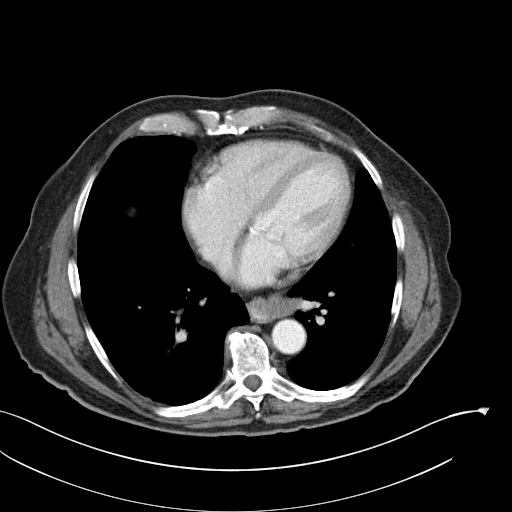

[Series 4: coronal a/|p · coronal · 0.74mm/px · 3 of 191 slices shown]
[im 64/191  soft-tissue]
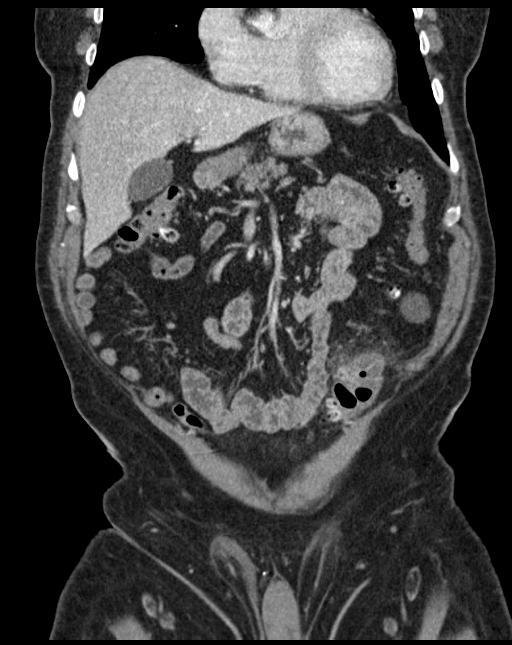
[im 85/191  soft-tissue]
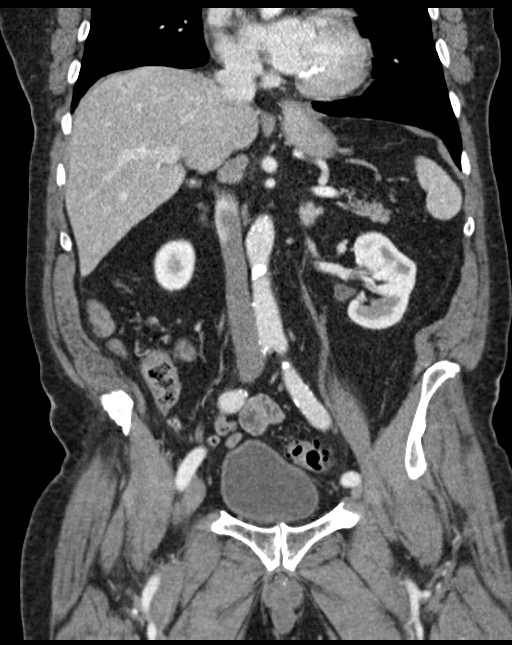
[im 106/191  soft-tissue]
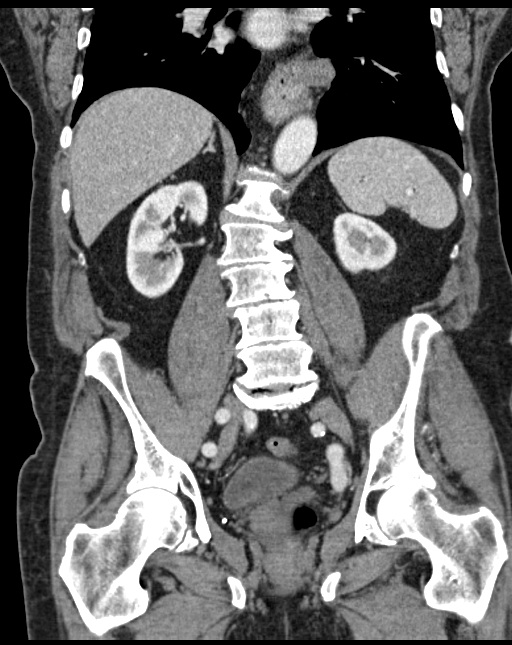

[16 of 46 positions shown; findings below may reference images not displayed]

FINDINGS: Lower chest: No acute abnormality.

Hepatobiliary: Unremarkable liver. Small dependent gallstones. No
gallbladder wall thickening or adjacent inflammation. No bile duct
dilation.

Pancreas: Unremarkable. No pancreatic ductal dilatation or
surrounding inflammatory changes.

Spleen: Normal in size without focal abnormality.

Adrenals/Urinary Tract: No adrenal masses. 3.8 cm lower pole left
renal cyst. No other renal masses, no stones and no hydronephrosis.
Normal ureters. Normal bladder.

Stomach/Bowel: Hazy inflammatory changes seen adjacent to proximal
sigmoid colon in the left lower quadrant. There are associated
diverticula. There is no extraluminal air or formed fluid collection
to suggest an abscess. Numerous other noninflamed diverticula are
noted throughout the colon. No other colonic inflammation. Small to
moderate hiatal hernia. Stomach otherwise unremarkable. Normal small
bowel. Normal appendix visualized.

Vascular/Lymphatic: Aortic atherosclerosis. No enlarged abdominal or
pelvic lymph nodes.

Reproductive: Unremarkable.

Other: No abdominal wall hernia.  No ascites.

Musculoskeletal: No fracture or acute finding. No osteoblastic or
osteolytic lesions.
IMPRESSION: 1. Acute uncomplicated diverticulitis of the proximal sigmoid colon.
No evidence of an abscess. No extraluminal or free air.
2. No other acute abnormalities.
3. Extensive colonic diverticulosis.
4. Gallstones, left renal cyst and small to moderate size hiatal
hernia.

## 2017-11-07 DIAGNOSIS — M7741 Metatarsalgia, right foot: Secondary | ICD-10-CM | POA: Diagnosis not present

## 2017-11-07 DIAGNOSIS — M79671 Pain in right foot: Secondary | ICD-10-CM | POA: Diagnosis not present

## 2017-11-07 DIAGNOSIS — M216X1 Other acquired deformities of right foot: Secondary | ICD-10-CM | POA: Diagnosis not present

## 2017-11-15 DIAGNOSIS — L719 Rosacea, unspecified: Secondary | ICD-10-CM | POA: Diagnosis not present

## 2017-11-15 DIAGNOSIS — C439 Malignant melanoma of skin, unspecified: Secondary | ICD-10-CM

## 2017-11-15 DIAGNOSIS — C4362 Malignant melanoma of left upper limb, including shoulder: Secondary | ICD-10-CM | POA: Diagnosis not present

## 2017-11-15 DIAGNOSIS — C4359 Malignant melanoma of other part of trunk: Secondary | ICD-10-CM | POA: Diagnosis not present

## 2017-11-15 DIAGNOSIS — L570 Actinic keratosis: Secondary | ICD-10-CM | POA: Diagnosis not present

## 2017-11-15 DIAGNOSIS — L720 Epidermal cyst: Secondary | ICD-10-CM | POA: Diagnosis not present

## 2017-11-15 HISTORY — DX: Malignant melanoma of skin, unspecified: C43.9

## 2017-12-01 DIAGNOSIS — L988 Other specified disorders of the skin and subcutaneous tissue: Secondary | ICD-10-CM | POA: Diagnosis not present

## 2017-12-01 DIAGNOSIS — C4359 Malignant melanoma of other part of trunk: Secondary | ICD-10-CM | POA: Diagnosis not present

## 2017-12-01 DIAGNOSIS — D2262 Melanocytic nevi of left upper limb, including shoulder: Secondary | ICD-10-CM | POA: Diagnosis not present

## 2017-12-13 DIAGNOSIS — Z4802 Encounter for removal of sutures: Secondary | ICD-10-CM | POA: Diagnosis not present

## 2018-01-02 DIAGNOSIS — D2372 Other benign neoplasm of skin of left lower limb, including hip: Secondary | ICD-10-CM | POA: Diagnosis not present

## 2018-01-02 DIAGNOSIS — M79672 Pain in left foot: Secondary | ICD-10-CM | POA: Diagnosis not present

## 2018-01-12 DIAGNOSIS — I10 Essential (primary) hypertension: Secondary | ICD-10-CM | POA: Diagnosis not present

## 2018-01-12 DIAGNOSIS — R7303 Prediabetes: Secondary | ICD-10-CM | POA: Diagnosis not present

## 2018-01-15 DIAGNOSIS — T783XXD Angioneurotic edema, subsequent encounter: Secondary | ICD-10-CM | POA: Diagnosis not present

## 2018-01-15 DIAGNOSIS — J3089 Other allergic rhinitis: Secondary | ICD-10-CM | POA: Diagnosis not present

## 2018-01-15 DIAGNOSIS — H1045 Other chronic allergic conjunctivitis: Secondary | ICD-10-CM | POA: Diagnosis not present

## 2018-01-15 DIAGNOSIS — Z91018 Allergy to other foods: Secondary | ICD-10-CM | POA: Diagnosis not present

## 2018-01-18 DIAGNOSIS — Z23 Encounter for immunization: Secondary | ICD-10-CM | POA: Diagnosis not present

## 2018-01-18 DIAGNOSIS — R311 Benign essential microscopic hematuria: Secondary | ICD-10-CM | POA: Diagnosis not present

## 2018-01-18 DIAGNOSIS — K5730 Diverticulosis of large intestine without perforation or abscess without bleeding: Secondary | ICD-10-CM | POA: Diagnosis not present

## 2018-01-18 DIAGNOSIS — R7303 Prediabetes: Secondary | ICD-10-CM | POA: Diagnosis not present

## 2018-01-18 DIAGNOSIS — I10 Essential (primary) hypertension: Secondary | ICD-10-CM | POA: Diagnosis not present

## 2018-03-14 DIAGNOSIS — D2262 Melanocytic nevi of left upper limb, including shoulder: Secondary | ICD-10-CM | POA: Diagnosis not present

## 2018-03-14 DIAGNOSIS — D2362 Other benign neoplasm of skin of left upper limb, including shoulder: Secondary | ICD-10-CM | POA: Diagnosis not present

## 2018-03-14 DIAGNOSIS — L570 Actinic keratosis: Secondary | ICD-10-CM | POA: Diagnosis not present

## 2018-03-14 DIAGNOSIS — L409 Psoriasis, unspecified: Secondary | ICD-10-CM | POA: Diagnosis not present

## 2018-03-14 DIAGNOSIS — L719 Rosacea, unspecified: Secondary | ICD-10-CM | POA: Diagnosis not present

## 2018-04-16 DIAGNOSIS — H04123 Dry eye syndrome of bilateral lacrimal glands: Secondary | ICD-10-CM | POA: Diagnosis not present

## 2018-04-16 DIAGNOSIS — Z961 Presence of intraocular lens: Secondary | ICD-10-CM | POA: Diagnosis not present

## 2018-04-16 DIAGNOSIS — H26493 Other secondary cataract, bilateral: Secondary | ICD-10-CM | POA: Diagnosis not present

## 2018-04-16 DIAGNOSIS — H5213 Myopia, bilateral: Secondary | ICD-10-CM | POA: Diagnosis not present

## 2018-04-16 DIAGNOSIS — G43809 Other migraine, not intractable, without status migrainosus: Secondary | ICD-10-CM | POA: Diagnosis not present

## 2018-07-06 DIAGNOSIS — L309 Dermatitis, unspecified: Secondary | ICD-10-CM | POA: Diagnosis not present

## 2018-07-06 DIAGNOSIS — L509 Urticaria, unspecified: Secondary | ICD-10-CM | POA: Diagnosis not present

## 2018-07-06 DIAGNOSIS — R05 Cough: Secondary | ICD-10-CM | POA: Diagnosis not present

## 2018-07-06 DIAGNOSIS — J069 Acute upper respiratory infection, unspecified: Secondary | ICD-10-CM | POA: Diagnosis not present

## 2018-07-11 DIAGNOSIS — Z0000 Encounter for general adult medical examination without abnormal findings: Secondary | ICD-10-CM | POA: Diagnosis not present

## 2018-07-11 DIAGNOSIS — L309 Dermatitis, unspecified: Secondary | ICD-10-CM | POA: Diagnosis not present

## 2018-07-11 DIAGNOSIS — R311 Benign essential microscopic hematuria: Secondary | ICD-10-CM | POA: Diagnosis not present

## 2018-07-11 DIAGNOSIS — K5730 Diverticulosis of large intestine without perforation or abscess without bleeding: Secondary | ICD-10-CM | POA: Diagnosis not present

## 2018-07-11 DIAGNOSIS — L509 Urticaria, unspecified: Secondary | ICD-10-CM | POA: Diagnosis not present

## 2018-07-11 DIAGNOSIS — R7303 Prediabetes: Secondary | ICD-10-CM | POA: Diagnosis not present

## 2018-07-11 DIAGNOSIS — I10 Essential (primary) hypertension: Secondary | ICD-10-CM | POA: Diagnosis not present

## 2018-07-18 DIAGNOSIS — M150 Primary generalized (osteo)arthritis: Secondary | ICD-10-CM | POA: Diagnosis not present

## 2018-07-18 DIAGNOSIS — I10 Essential (primary) hypertension: Secondary | ICD-10-CM | POA: Diagnosis not present

## 2018-07-18 DIAGNOSIS — R311 Benign essential microscopic hematuria: Secondary | ICD-10-CM | POA: Diagnosis not present

## 2018-07-18 DIAGNOSIS — T783XXD Angioneurotic edema, subsequent encounter: Secondary | ICD-10-CM | POA: Diagnosis not present

## 2018-07-18 DIAGNOSIS — N3941 Urge incontinence: Secondary | ICD-10-CM | POA: Diagnosis not present

## 2018-07-18 DIAGNOSIS — R7303 Prediabetes: Secondary | ICD-10-CM | POA: Diagnosis not present

## 2018-07-18 DIAGNOSIS — L509 Urticaria, unspecified: Secondary | ICD-10-CM | POA: Diagnosis not present

## 2018-07-18 DIAGNOSIS — K5730 Diverticulosis of large intestine without perforation or abscess without bleeding: Secondary | ICD-10-CM | POA: Diagnosis not present

## 2018-10-11 DIAGNOSIS — C44619 Basal cell carcinoma of skin of left upper limb, including shoulder: Secondary | ICD-10-CM | POA: Diagnosis not present

## 2018-10-11 DIAGNOSIS — C4491 Basal cell carcinoma of skin, unspecified: Secondary | ICD-10-CM

## 2018-10-11 DIAGNOSIS — Z85820 Personal history of malignant melanoma of skin: Secondary | ICD-10-CM | POA: Diagnosis not present

## 2018-10-11 DIAGNOSIS — D229 Melanocytic nevi, unspecified: Secondary | ICD-10-CM | POA: Diagnosis not present

## 2018-10-11 DIAGNOSIS — C44519 Basal cell carcinoma of skin of other part of trunk: Secondary | ICD-10-CM | POA: Diagnosis not present

## 2018-10-11 DIAGNOSIS — L570 Actinic keratosis: Secondary | ICD-10-CM | POA: Diagnosis not present

## 2018-10-11 DIAGNOSIS — D485 Neoplasm of uncertain behavior of skin: Secondary | ICD-10-CM | POA: Diagnosis not present

## 2018-10-11 HISTORY — DX: Basal cell carcinoma of skin, unspecified: C44.91

## 2018-11-01 DIAGNOSIS — R319 Hematuria, unspecified: Secondary | ICD-10-CM | POA: Diagnosis not present

## 2018-11-08 DIAGNOSIS — Z1159 Encounter for screening for other viral diseases: Secondary | ICD-10-CM | POA: Diagnosis not present

## 2018-12-07 ENCOUNTER — Other Ambulatory Visit: Payer: Self-pay

## 2019-01-03 DIAGNOSIS — C44619 Basal cell carcinoma of skin of left upper limb, including shoulder: Secondary | ICD-10-CM | POA: Diagnosis not present

## 2019-01-21 DIAGNOSIS — H1045 Other chronic allergic conjunctivitis: Secondary | ICD-10-CM | POA: Diagnosis not present

## 2019-01-21 DIAGNOSIS — Z91018 Allergy to other foods: Secondary | ICD-10-CM | POA: Diagnosis not present

## 2019-01-21 DIAGNOSIS — J3089 Other allergic rhinitis: Secondary | ICD-10-CM | POA: Diagnosis not present

## 2019-01-21 DIAGNOSIS — T783XXD Angioneurotic edema, subsequent encounter: Secondary | ICD-10-CM | POA: Diagnosis not present

## 2019-01-30 DIAGNOSIS — R311 Benign essential microscopic hematuria: Secondary | ICD-10-CM | POA: Diagnosis not present

## 2019-01-30 DIAGNOSIS — R7303 Prediabetes: Secondary | ICD-10-CM | POA: Diagnosis not present

## 2019-01-30 DIAGNOSIS — I10 Essential (primary) hypertension: Secondary | ICD-10-CM | POA: Diagnosis not present

## 2019-02-06 DIAGNOSIS — I10 Essential (primary) hypertension: Secondary | ICD-10-CM | POA: Diagnosis not present

## 2019-02-06 DIAGNOSIS — Z23 Encounter for immunization: Secondary | ICD-10-CM | POA: Diagnosis not present

## 2019-02-06 DIAGNOSIS — R311 Benign essential microscopic hematuria: Secondary | ICD-10-CM | POA: Diagnosis not present

## 2019-02-06 DIAGNOSIS — N3941 Urge incontinence: Secondary | ICD-10-CM | POA: Diagnosis not present

## 2019-02-06 DIAGNOSIS — M150 Primary generalized (osteo)arthritis: Secondary | ICD-10-CM | POA: Diagnosis not present

## 2019-02-21 DIAGNOSIS — M25561 Pain in right knee: Secondary | ICD-10-CM | POA: Diagnosis not present

## 2019-02-21 DIAGNOSIS — Z96652 Presence of left artificial knee joint: Secondary | ICD-10-CM | POA: Diagnosis not present

## 2019-02-21 DIAGNOSIS — M1711 Unilateral primary osteoarthritis, right knee: Secondary | ICD-10-CM | POA: Diagnosis not present

## 2019-03-06 DIAGNOSIS — I10 Essential (primary) hypertension: Secondary | ICD-10-CM | POA: Diagnosis not present

## 2019-03-06 DIAGNOSIS — Z01818 Encounter for other preprocedural examination: Secondary | ICD-10-CM | POA: Diagnosis not present

## 2019-03-14 DIAGNOSIS — R311 Benign essential microscopic hematuria: Secondary | ICD-10-CM | POA: Diagnosis not present

## 2019-03-14 DIAGNOSIS — R7303 Prediabetes: Secondary | ICD-10-CM | POA: Diagnosis not present

## 2019-03-14 DIAGNOSIS — Z01818 Encounter for other preprocedural examination: Secondary | ICD-10-CM | POA: Diagnosis not present

## 2019-03-14 DIAGNOSIS — I10 Essential (primary) hypertension: Secondary | ICD-10-CM | POA: Diagnosis not present

## 2019-03-14 DIAGNOSIS — Z7189 Other specified counseling: Secondary | ICD-10-CM | POA: Diagnosis not present

## 2019-03-14 DIAGNOSIS — M150 Primary generalized (osteo)arthritis: Secondary | ICD-10-CM | POA: Diagnosis not present

## 2019-03-20 DIAGNOSIS — D229 Melanocytic nevi, unspecified: Secondary | ICD-10-CM | POA: Diagnosis not present

## 2019-03-20 DIAGNOSIS — L219 Seborrheic dermatitis, unspecified: Secondary | ICD-10-CM | POA: Diagnosis not present

## 2019-03-20 DIAGNOSIS — L570 Actinic keratosis: Secondary | ICD-10-CM | POA: Diagnosis not present

## 2019-03-27 NOTE — H&P (Signed)
TOTAL KNEE ADMISSION H&P  Patient is being admitted for right total knee arthroplasty.  Subjective:  Chief Complaint:right knee pain.  HPI: John Austin, 78 y.o. male, has a history of pain and functional disability in the right knee due to arthritis and has failed non-surgical conservative treatments for greater than 12 weeks to includeNSAID's and/or analgesics, flexibility and strengthening excercises and activity modification.  Onset of symptoms was gradual, starting 5 years ago with gradually worsening course since that time. The patient noted no past surgery on the right knee(s).  Patient currently rates pain in the right knee(s) at 5 out of 10 with activity. Patient has night pain, worsening of pain with activity and weight bearing, pain that interferes with activities of daily living, pain with passive range of motion, crepitus and joint swelling.  Patient has evidence of periarticular osteophytes and joint space narrowing by imaging studies.There is no active infection.  Patient Active Problem List   Diagnosis Date Noted  . OA (osteoarthritis) of knee 08/08/2016  . Leukocytosis 04/29/2015  . Abdominal pain, left lower quadrant 04/29/2015  . Acute diverticulitis 04/27/2015  . Essential hypertension 04/27/2015   Past Medical History:  Diagnosis Date  . Arthritis   . Cancer (HCC)    hx of skin cancer   . Complication of anesthesia   . Essential hypertension   . GERD (gastroesophageal reflux disease)   . History of hiatal hernia   . PONV (postoperative nausea and vomiting)    per pt "slow to wake, difficult to put to sleep"     Past Surgical History:  Procedure Laterality Date  . BILATERAL SHOULDER SURGERY    . COLONOSCOPY    . EYE SURGERY     cataract extractions   . eyebrow surgery     . RIGHT FOOT SURGERY    . TOTAL KNEE ARTHROPLASTY Left 08/08/2016   Procedure: LEFT TOTAL KNEE ARTHROPLASTY;  Surgeon: Gaynelle Arabian, MD;  Location: [facility];  Service: Orthopedics;   Laterality: Left;  Adductor Block; Failed Spinal       Current Outpatient Medications  Medication Sig Dispense Refill Last Dose  . aspirin EC 81 MG tablet Take 81 mg by mouth daily.     Marland Kitchen azelastine (OPTIVAR) 0.05 % ophthalmic solution Place 1 drop into both eyes 2 (two) times daily.     . Cinnamon 500 MG TABS Take 500 mg by mouth daily.     . clobetasol (TEMOVATE) 0.05 % external solution Apply 1 application topically daily.     Marland Kitchen diltiazem (TIAZAC) 180 MG 24 hr capsule Take 180 mg by mouth every evening.     . docusate sodium (COLACE) 100 MG capsule Take 100-200 mg by mouth daily as needed (constipation.).      Marland Kitchen doxycycline (VIBRAMYCIN) 50 MG capsule Take 50 mg by mouth 2 (two) times daily.     . fexofenadine (ALLEGRA) 180 MG tablet Take 180 mg by mouth daily as needed for allergies.      . fluticasone (FLONASE) 50 MCG/ACT nasal spray Place 1-2 sprays into both nostrils at bedtime. Allergies.     Marland Kitchen ketoconazole (NIZORAL) 2 % shampoo Apply 1 application topically 3 (three) times a week.      . meclizine (ANTIVERT) 25 MG tablet Take 12.5-25 mg by mouth daily as needed (vertigo). dizziness     . metroNIDAZOLE (METROCREAM) 0.75 % cream Apply 1 application topically daily.      . Multiple Vitamin (MULTIVITAMIN WITH MINERALS) TABS tablet Take 1 tablet  by mouth daily.     . Omega-3 Fatty Acids (FISH OIL PO) Take 1 capsule by mouth daily.     Marland Kitchen triamcinolone cream (KENALOG) 0.1 % Apply 1 application topically daily as needed (rosacea).       No Known Allergies  Social History   Tobacco Use  . Smoking status: Former Research scientist (life sciences)  . Smokeless tobacco: Current User    Types: Chew  . Tobacco comment: greater than 30 years   Substance Use Topics  . Alcohol use: No    Comment: quit due to divertuclitis       Review of Systems  Constitutional: Negative.   HENT: Negative.   Eyes: Negative.   Cardiovascular: Negative.   Gastrointestinal: Positive for heartburn. Negative for abdominal pain, blood  in stool, constipation, diarrhea, melena, nausea and vomiting.  Genitourinary: Negative.   Musculoskeletal: Positive for joint pain and myalgias. Negative for back pain, falls and neck pain.  Skin: Negative.   Neurological: Negative.   Endo/Heme/Allergies: Negative.   Psychiatric/Behavioral: Negative.     Objective:  Physical Exam  Constitutional: He is oriented to person, place, and time. He appears well-developed. No distress.  Overweight  HENT:  Head: Normocephalic and atraumatic.  Right Ear: External ear normal.  Left Ear: External ear normal.  Nose: Nose normal.  Mouth/Throat: Oropharynx is clear and moist.  Eyes: Conjunctivae and EOM are normal.  Neck: Normal range of motion. Neck supple.  Cardiovascular: Normal rate, regular rhythm, normal heart sounds and intact distal pulses.  No murmur heard. Respiratory: Effort normal and breath sounds normal. No respiratory distress. He has no wheezes.  GI: Soft. Bowel sounds are normal. He exhibits no distension. There is no abdominal tenderness.  Musculoskeletal:     Comments: Significantly antalgic gait pattern favoring the right side without using assisted devices.  Right Knee Exam: Moderate effusion. Significant valgus deformity. Range of motion is 0-125 degrees. Moderate crepitus on range of motion of the knee. No medial joint line tenderness Lateral joint line tenderness. Stable knee.  Left Knee Exam: No effusion. Range of motion is 0-125 degrees. No crepitus on range of motion of the knee. No medial or lateral joint line tenderness. Stable knee.  Neurological: He is alert and oriented to person, place, and time. He has normal strength. No sensory deficit.  Skin: No rash noted. He is not diaphoretic. No erythema.  Psychiatric: He has a normal mood and affect. His behavior is normal.    Vitals Ht: 5 ft 10 in Wt: 195 lbs  BMI: 28 BP: 142/74 sitting L arm Pulse: 80 bpm   Imaging Review Plain radiographs  demonstrate severe degenerative joint disease of the right knee(s). The overall alignment issignificant valgus. The bone quality appears to be good for age and reported activity level.    Assessment/Plan:  End stage primary osteoarthritis, right knee   The patient history, physical examination, clinical judgment of the provider and imaging studies are consistent with end stage degenerative joint disease of the right knee(s) and total knee arthroplasty is deemed medically necessary. The treatment options including medical management, injection therapy arthroscopy and arthroplasty were discussed at length. The risks and benefits of total knee arthroplasty were presented and reviewed. The risks due to aseptic loosening, infection, stiffness, patella tracking problems, thromboembolic complications and other imponderables were discussed. The patient acknowledged the explanation, agreed to proceed with the plan and consent was signed. Patient is being admitted for inpatient treatment for surgery, pain control, PT, OT, prophylactic antibiotics, VTE prophylaxis,  progressive ambulation and ADL's and discharge planning. The patient is planning to be discharged home.     Anticipated LOS equal to or greater than 2 midnights due to - Age 20 and older with one or more of the following:  - Obesity  - Expected need for hospital services (PT, OT, Nursing) required for safe  discharge  - Anticipated need for postoperative skilled nursing care or inpatient rehab  - Active co-morbidities: None OR   - Unanticipated findings during/Post Surgery: None  - Patient is a high risk of re-admission due to: None    Risks and benefits of the surgery were discussed with the patient and Dr.Aluisio at their previous office visit, and the patient has elected to move forward with the aforementioned surgery. Post-operative care plans were discussed with the patient today.  Therapy Plans: outpatient therapy at [facility] Disposition: Home with wife Planned DVT prophylaxis: aspirin 325mg  BID DME needed: rolling walker; 3-n-1 PCP: Dr. Ashby Dawes- awaiting clearance Other: Anesthesiologist had some difficulty with spinal anesthesia but wants to try  Instructed patient on meds to stop prior to surgery  Ardeen Jourdain, PA-C

## 2019-03-27 NOTE — Patient Instructions (Addendum)
DUE TO COVID-19 ONLY ONE VISITOR IS ALLOWED TO COME WITH YOU AND STAY IN THE WAITING ROOM ONLY DURING PRE OP AND PROCEDURE DAY OF SURGERY. THE 1 VISITOR MAY VISIT WITH YOU AFTER SURGERY IN YOUR PRIVATE ROOM DURING VISITING HOURS ONLY!  YOU NEED TO HAVE A COVID 19 TEST ON 03-28-19 @ 12:20 PM, THIS TEST MUST BE DONE BEFORE SURGERY, COME  [place], [place] , [address].  ([place]) ONCE YOUR COVID TEST IS COMPLETED, PLEASE BEGIN THE QUARANTINE INSTRUCTIONS AS OUTLINED IN YOUR HANDOUT.                John Austin  03/27/2019   Your procedure is scheduled on: 04-01-19    Report to [facility] Main  Entrance    Report to Admitting at 6:55AM     Call this number if you have problems the morning of surgery [phone]    NO SOLID FOOD AFTER MIDNIGHT THE NIGHT PRIOR TO SURGERY. YOU MAY DRINK CLEAR LIQUIDS.  STOP CLEAR LIQUIDS AT 6:20 AM AND THEN DRINK THE G2 PRE-SURGERY DRINK. NOTHING BY MOUTH AFTER THE G2 DRINK!    CLEAR LIQUID DIET   Foods Allowed                                                                     Foods Excluded  Coffee and tea, regular and decaf                             liquids that you cannot  Plain Jell-O any favor except red or purple                                           see through such as: Fruit ices (not with fruit pulp)                                     milk, soups, orange juice  Iced Popsicles                                    All solid food Carbonated beverages, regular and diet                                    Cranberry, grape and apple juices Sports drinks like Gatorade Lightly seasoned clear broth or consume(fat free) Sugar, honey syrup   _____________________________________________________________________  BRUSH YOUR TEETH MORNING OF SURGERY AND RINSE YOUR MOUTH OUT, NO CHEWING GUM CANDY OR MINTS.     Take these medicines the morning of surgery with A SIP OF WATER: Antivert if needed  DO NOT TAKE  ANY DIABETIC MEDICATIONS DAY OF YOUR SURGERY                               You  may not have any metal on your body including hair pins and              piercings     Do not wear jewelry, make-up, lotions, powders or perfumes, deodorant                  Men may shave face and neck.   Do not bring valuables to the hospital. [place].  Contacts, dentures or bridgework may not be worn into surgery.  Leave suitcase in the car. After surgery it may be brought to your room.                   Please read over the following fact sheets you were given: _____________________________________________________________________             [facility] - Preparing for Surgery Before surgery, you can play an important role.  Because skin is not sterile, your skin needs to be as free of germs as possible.  You can reduce the number of germs on your skin by washing with CHG (chlorahexidine gluconate) soap before surgery.  CHG is an antiseptic cleaner which kills germs and bonds with the skin to continue killing germs even after washing. Please DO NOT use if you have an allergy to CHG or antibacterial soaps.  If your skin becomes reddened/irritated stop using the CHG and inform your nurse when you arrive at Short Stay. Do not shave (including legs and underarms) for at least 48 hours prior to the first CHG shower.  You may shave your face/neck. Please follow these instructions carefully:  1.  Shower with CHG Soap the night before surgery and the  morning of Surgery.  2.  If you choose to wash your hair, wash your hair first as usual with your  normal  shampoo.  3.  After you shampoo, rinse your hair and body thoroughly to remove the  shampoo.                           4.  Use CHG as you would any other liquid soap.  You can apply chg directly  to the skin and wash                       Gently with a scrungie or clean washcloth.  5.  Apply the CHG Soap to  your body ONLY FROM THE NECK DOWN.   Do not use on face/ open                           Wound or open sores. Avoid contact with eyes, ears mouth and genitals (private parts).                       Wash face,  Genitals (private parts) with your normal soap.             6.  Wash thoroughly, paying special attention to the area where your surgery  will be performed.  7.  Thoroughly rinse your body with warm water from the neck down.  8.  DO NOT shower/wash with your normal soap after using and rinsing off  the CHG Soap.  9.  Pat yourself dry with a clean towel.            10.  Wear clean pajamas.            11.  Place clean sheets on your bed the night of your first shower and do not  sleep with pets. Day of Surgery : Do not apply any lotions/deodorants the morning of surgery.  Please wear clean clothes to the hospital/surgery center.  FAILURE TO FOLLOW THESE INSTRUCTIONS MAY RESULT IN THE CANCELLATION OF YOUR SURGERY PATIENT SIGNATURE_________________________________  NURSE SIGNATURE__________________________________  ________________________________________________________________________   John Austin  An incentive spirometer is a tool that can help keep your lungs clear and active. This tool measures how well you are filling your lungs with each breath. Taking long deep breaths may help reverse or decrease the chance of developing breathing (pulmonary) problems (especially infection) following:  A long period of time when you are unable to move or be active. BEFORE THE PROCEDURE   If the spirometer includes an indicator to show your best effort, your nurse or respiratory therapist will set it to a desired goal.  If possible, sit up straight or lean slightly forward. Try not to slouch.  Hold the incentive spirometer in an upright position. INSTRUCTIONS FOR USE  1. Sit on the edge of your bed if possible, or sit up as far as you can in bed or on a  chair. 2. Hold the incentive spirometer in an upright position. 3. Breathe out normally. 4. Place the mouthpiece in your mouth and seal your lips tightly around it. 5. Breathe in slowly and as deeply as possible, raising the piston or the ball toward the top of the column. 6. Hold your breath for 3-5 seconds or for as long as possible. Allow the piston or ball to fall to the bottom of the column. 7. Remove the mouthpiece from your mouth and breathe out normally. 8. Rest for a few seconds and repeat Steps 1 through 7 at least 10 times every 1-2 hours when you are awake. Take your time and take a few normal breaths between deep breaths. 9. The spirometer may include an indicator to show your best effort. Use the indicator as a goal to work toward during each repetition. 10. After each set of 10 deep breaths, practice coughing to be sure your lungs are clear. If you have an incision (the cut made at the time of surgery), support your incision when coughing by placing a pillow or rolled up towels firmly against it. Once you are able to get out of bed, walk around indoors and cough well. You may stop using the incentive spirometer when instructed by your caregiver.  RISKS AND COMPLICATIONS  Take your time so you do not get dizzy or light-headed.  If you are in pain, you may need to take or ask for pain medication before doing incentive spirometry. It is harder to take a deep breath if you are having pain. AFTER USE  Rest and breathe slowly and easily.  It can be helpful to keep track of a log of your progress. Your caregiver can provide you with a simple table to help with this. If you are using the spirometer at home, follow these instructions: [place] IF:   You are having difficultly using the spirometer.  You have trouble using the spirometer as often as instructed.  Your pain medication is not giving enough relief while using the spirometer.  You develop  fever of 100.5 F  (38.1 C) or higher. SEEK IMMEDIATE MEDICAL CARE IF:   You cough up bloody sputum that had not been present before.  You develop fever of 102 F (38.9 C) or greater.  You develop worsening pain at or near the incision site. MAKE SURE YOU:   Understand these instructions.  Will watch your condition.  Will get help right away if you are not doing well or get worse. Document Released: 09/05/2006 Document Revised: 07/18/2011 Document Reviewed: 11/06/2006 ExitCare Patient Information 2014 ExitCare, [place].   ________________________________________________________________________  WHAT IS A BLOOD TRANSFUSION? Blood Transfusion Information  A transfusion is the replacement of blood or some of its parts. Blood is made up of multiple cells which provide different functions.  Red blood cells carry oxygen and are used for blood loss replacement.  White blood cells fight against infection.  Platelets control bleeding.  Plasma helps clot blood.  Other blood products are available for specialized needs, such as hemophilia or other clotting disorders. BEFORE THE TRANSFUSION  Who gives blood for transfusions?   Healthy volunteers who are fully evaluated to make sure their blood is safe. This is blood bank blood. Transfusion therapy is the safest it has ever been in the practice of medicine. Before blood is taken from a donor, a complete history is taken to make sure that person has no history of diseases nor engages in risky social behavior (examples are intravenous drug use or sexual activity with multiple partners). The donor's travel history is screened to minimize risk of transmitting infections, such as malaria. The donated blood is tested for signs of infectious diseases, such as HIV and hepatitis. The blood is then tested to be sure it is compatible with you in order to minimize the chance of a transfusion reaction. If you or a relative donates blood, this is often done in anticipation  of surgery and is not appropriate for emergency situations. It takes many days to process the donated blood. RISKS AND COMPLICATIONS Although transfusion therapy is very safe and saves many lives, the main dangers of transfusion include:   Getting an infectious disease.  Developing a transfusion reaction. This is an allergic reaction to something in the blood you were given. Every precaution is taken to prevent this. The decision to have a blood transfusion has been considered carefully by your caregiver before blood is given. Blood is not given unless the benefits outweigh the risks. AFTER THE TRANSFUSION  Right after receiving a blood transfusion, you will usually feel much better and more energetic. This is especially true if your red blood cells have gotten low (anemic). The transfusion raises the level of the red blood cells which carry oxygen, and this usually causes an energy increase.  The nurse administering the transfusion will monitor you carefully for complications. HOME CARE INSTRUCTIONS  No special instructions are needed after a transfusion. You may find your energy is better. Speak with your caregiver about any limitations on activity for underlying diseases you may have. SEEK MEDICAL CARE IF:   Your condition is not improving after your transfusion.  You develop redness or irritation at the intravenous (IV) site. SEEK IMMEDIATE MEDICAL CARE IF:  Any of the following symptoms occur over the next 12 hours:  Shaking chills.  You have a temperature by mouth above 102 F (38.9 C), not controlled by medicine.  Chest, back, or muscle pain.  People around you feel you are not acting correctly or are confused.  Shortness of breath  or difficulty breathing.  Dizziness and fainting.  You get a rash or develop hives.  You have a decrease in urine output.  Your urine turns a dark color or changes to pink, red, or brown. Any of the following symptoms occur over the next 10  days:  You have a temperature by mouth above 102 F (38.9 C), not controlled by medicine.  Shortness of breath.  Weakness after normal activity.  The white part of the eye turns yellow (jaundice).  You have a decrease in the amount of urine or are urinating less often.  Your urine turns a dark color or changes to pink, red, or brown. Document Released: 04/22/2000 Document Revised: 07/18/2011 Document Reviewed: 12/10/2007 [facility] Patient Information 2014 [place], [place].  _______________________________________________________________________

## 2019-03-28 ENCOUNTER — Encounter (HOSPITAL_COMMUNITY)
Admission: RE | Admit: 2019-03-28 | Discharge: 2019-03-28 | Disposition: A | Discharge status: Home / Self Care | Payer: Medicare Other | Source: Ambulatory Visit | Attending: Orthopedic Surgery | Admitting: Orthopedic Surgery

## 2019-03-28 ENCOUNTER — Encounter (HOSPITAL_COMMUNITY): Payer: Self-pay

## 2019-03-28 ENCOUNTER — Encounter (INDEPENDENT_AMBULATORY_CARE_PROVIDER_SITE_OTHER): Payer: Self-pay

## 2019-03-28 ENCOUNTER — Other Ambulatory Visit: Payer: Self-pay

## 2019-03-28 ENCOUNTER — Other Ambulatory Visit (HOSPITAL_COMMUNITY)
Admission: RE | Admit: 2019-03-28 | Discharge: 2019-03-28 | Disposition: A | Discharge status: Home / Self Care | Payer: Medicare Other | Source: Ambulatory Visit | Attending: Orthopedic Surgery | Admitting: Orthopedic Surgery

## 2019-03-28 DIAGNOSIS — I10 Essential (primary) hypertension: Secondary | ICD-10-CM | POA: Diagnosis not present

## 2019-03-28 DIAGNOSIS — Z01818 Encounter for other preprocedural examination: Secondary | ICD-10-CM | POA: Diagnosis not present

## 2019-03-28 DIAGNOSIS — Z79899 Other long term (current) drug therapy: Secondary | ICD-10-CM | POA: Diagnosis not present

## 2019-03-28 DIAGNOSIS — Z7982 Long term (current) use of aspirin: Secondary | ICD-10-CM | POA: Insufficient documentation

## 2019-03-28 DIAGNOSIS — Z20828 Contact with and (suspected) exposure to other viral communicable diseases: Secondary | ICD-10-CM | POA: Diagnosis not present

## 2019-03-28 DIAGNOSIS — M1711 Unilateral primary osteoarthritis, right knee: Secondary | ICD-10-CM | POA: Diagnosis not present

## 2019-03-28 HISTORY — DX: Prediabetes: R73.03

## 2019-03-28 LAB — CBC
HCT: 44.5 % (ref 39.0–52.0)
Hemoglobin: 14.5 g/dL (ref 13.0–17.0)
MCH: 27.2 pg (ref 26.0–34.0)
MCHC: 32.6 g/dL (ref 30.0–36.0)
MCV: 83.5 fL (ref 80.0–100.0)
Platelets: 299 10*3/uL (ref 150–400)
RBC: 5.33 MIL/uL (ref 4.22–5.81)
RDW: 14.4 % (ref 11.5–15.5)
WBC: 8.6 10*3/uL (ref 4.0–10.5)
nRBC: 0 % (ref 0.0–0.2)

## 2019-03-28 LAB — COMPREHENSIVE METABOLIC PANEL
ALT: 18 U/L (ref 0–44)
AST: 20 U/L (ref 15–41)
Albumin: 4.1 g/dL (ref 3.5–5.0)
Alkaline Phosphatase: 59 U/L (ref 38–126)
Anion gap: 11 (ref 5–15)
BUN: 9 mg/dL (ref 8–23)
CO2: 31 mmol/L (ref 22–32)
Calcium: 9 mg/dL (ref 8.9–10.3)
Chloride: 101 mmol/L (ref 98–111)
Creatinine, Ser: 0.72 mg/dL (ref 0.61–1.24)
GFR calc Af Amer: >60 mL/min (ref >60)
GFR calc non Af Amer: >60 mL/min (ref >60)
Glucose, Bld: 114 mg/dL — ABNORMAL HIGH (ref 70–99)
Potassium: 3.8 mmol/L (ref 3.5–5.1)
Sodium: 143 mmol/L (ref 135–145)
Total Bilirubin: 0.8 mg/dL (ref 0.3–1.2)
Total Protein: 7.1 g/dL (ref 6.5–8.1)

## 2019-03-28 LAB — HEMOGLOBIN A1C
Hgb A1c MFr Bld: 5.8 % — ABNORMAL HIGH (ref 4.8–5.6)
Mean Plasma Glucose: 119.76 mg/dL

## 2019-03-28 LAB — SURGICAL PCR SCREEN
MRSA, PCR: NEGATIVE
Staphylococcus aureus: NEGATIVE

## 2019-03-28 LAB — APTT: aPTT: 31 seconds (ref 24–36)

## 2019-03-28 LAB — PROTIME-INR
INR: 1.1 (ref 0.8–1.2)
Prothrombin Time: 13.8 seconds (ref 11.4–15.2)

## 2019-03-29 LAB — NOVEL CORONAVIRUS, NAA (HOSP ORDER, SEND-OUT TO REF LAB; TAT 18-24 HRS): SARS-CoV-2, NAA: NOT DETECTED

## 2019-03-31 MED ORDER — BUPIVACAINE LIPOSOME 1.3 % IJ SUSP
20.0000 mL | Freq: Once | INTRAMUSCULAR | Status: DC
  Filled 2019-03-31: qty 20

## 2019-04-01 ENCOUNTER — Encounter (HOSPITAL_COMMUNITY)
Admission: RE | Disposition: A | Discharge status: Home / Self Care | Payer: Self-pay | Source: Ambulatory Visit | Attending: Orthopedic Surgery

## 2019-04-01 ENCOUNTER — Encounter (HOSPITAL_COMMUNITY): Payer: Self-pay | Admitting: *Deleted

## 2019-04-01 ENCOUNTER — Observation Stay (HOSPITAL_COMMUNITY)
Admission: RE | Admit: 2019-04-01 | Discharge: 2019-04-02 | Disposition: A | Discharge status: Home / Self Care | Payer: Medicare Other | Source: Ambulatory Visit | Attending: Orthopedic Surgery | Admitting: Orthopedic Surgery

## 2019-04-01 ENCOUNTER — Other Ambulatory Visit: Payer: Self-pay

## 2019-04-01 ENCOUNTER — Inpatient Hospital Stay (HOSPITAL_COMMUNITY): Payer: Medicare Other | Admitting: Physician Assistant

## 2019-04-01 ENCOUNTER — Inpatient Hospital Stay (HOSPITAL_COMMUNITY): Payer: Medicare Other | Admitting: Anesthesiology

## 2019-04-01 DIAGNOSIS — Z7982 Long term (current) use of aspirin: Secondary | ICD-10-CM | POA: Insufficient documentation

## 2019-04-01 DIAGNOSIS — I10 Essential (primary) hypertension: Secondary | ICD-10-CM | POA: Insufficient documentation

## 2019-04-01 DIAGNOSIS — Z96652 Presence of left artificial knee joint: Secondary | ICD-10-CM | POA: Insufficient documentation

## 2019-04-01 DIAGNOSIS — K219 Gastro-esophageal reflux disease without esophagitis: Secondary | ICD-10-CM | POA: Insufficient documentation

## 2019-04-01 DIAGNOSIS — M1710 Unilateral primary osteoarthritis, unspecified knee: Secondary | ICD-10-CM | POA: Diagnosis present

## 2019-04-01 DIAGNOSIS — M179 Osteoarthritis of knee, unspecified: Secondary | ICD-10-CM | POA: Diagnosis present

## 2019-04-01 DIAGNOSIS — D72829 Elevated white blood cell count, unspecified: Secondary | ICD-10-CM | POA: Diagnosis not present

## 2019-04-01 DIAGNOSIS — Z792 Long term (current) use of antibiotics: Secondary | ICD-10-CM | POA: Insufficient documentation

## 2019-04-01 DIAGNOSIS — Z87891 Personal history of nicotine dependence: Secondary | ICD-10-CM | POA: Diagnosis not present

## 2019-04-01 DIAGNOSIS — M1711 Unilateral primary osteoarthritis, right knee: Secondary | ICD-10-CM | POA: Diagnosis present

## 2019-04-01 DIAGNOSIS — G8918 Other acute postprocedural pain: Secondary | ICD-10-CM | POA: Diagnosis not present

## 2019-04-01 DIAGNOSIS — R7303 Prediabetes: Secondary | ICD-10-CM | POA: Diagnosis not present

## 2019-04-01 HISTORY — PX: TOTAL KNEE ARTHROPLASTY: SHX125

## 2019-04-01 LAB — TYPE AND SCREEN
ABO/RH(D): A POS
Antibody Screen: NEGATIVE

## 2019-04-01 SURGERY — ARTHROPLASTY, KNEE, TOTAL
Anesthesia: Spinal | Site: Knee | Laterality: Right

## 2019-04-01 MED ORDER — DEXAMETHASONE SODIUM PHOSPHATE 10 MG/ML IJ SOLN
INTRAMUSCULAR | Status: DC | PRN
  Administered 2019-04-01: 4 mg via INTRAVENOUS

## 2019-04-01 MED ORDER — CEFAZOLIN SODIUM-DEXTROSE 2-4 GM/100ML-% IV SOLN
2.0000 g | Freq: Four times a day (QID) | INTRAVENOUS | Status: AC
  Administered 2019-04-01 (×2): 2 g via INTRAVENOUS
  Filled 2019-04-01 (×2): qty 100

## 2019-04-01 MED ORDER — ONDANSETRON HCL 4 MG/2ML IJ SOLN
INTRAMUSCULAR | Status: AC
  Filled 2019-04-01: qty 2

## 2019-04-01 MED ORDER — FLUTICASONE PROPIONATE 50 MCG/ACT NA SUSP
1.0000 | Freq: Every day | NASAL | Status: DC
  Filled 2019-04-01: qty 16

## 2019-04-01 MED ORDER — ONDANSETRON HCL 4 MG PO TABS: 4.0000 mg | ORAL_TABLET | Freq: Four times a day (QID) | ORAL | Status: DC | PRN

## 2019-04-01 MED ORDER — PROPOFOL 10 MG/ML IV BOLUS
INTRAVENOUS | Status: AC
  Filled 2019-04-01: qty 20

## 2019-04-01 MED ORDER — DOCUSATE SODIUM 100 MG PO CAPS
100.0000 mg | ORAL_CAPSULE | Freq: Two times a day (BID) | ORAL | Status: DC
  Administered 2019-04-01 – 2019-04-02 (×2): 100 mg via ORAL
  Filled 2019-04-01 (×2): qty 1

## 2019-04-01 MED ORDER — METHOCARBAMOL 500 MG IVPB - SIMPLE MED
INTRAVENOUS | Status: AC
  Filled 2019-04-01: qty 50

## 2019-04-01 MED ORDER — CEFAZOLIN SODIUM-DEXTROSE 2-4 GM/100ML-% IV SOLN
2.0000 g | INTRAVENOUS | Status: AC
  Administered 2019-04-01: 2 g via INTRAVENOUS

## 2019-04-01 MED ORDER — MIDAZOLAM HCL 2 MG/2ML IJ SOLN
INTRAMUSCULAR | Status: AC
  Filled 2019-04-01: qty 2

## 2019-04-01 MED ORDER — PROPOFOL 500 MG/50ML IV EMUL
INTRAVENOUS | Status: AC
  Filled 2019-04-01: qty 50

## 2019-04-01 MED ORDER — MORPHINE SULFATE (PF) 4 MG/ML IV SOLN: 1.0000 mg | INTRAVENOUS | Status: DC | PRN

## 2019-04-01 MED ORDER — CHLORHEXIDINE GLUCONATE 4 % EX LIQD: 60.0000 mL | Freq: Once | CUTANEOUS | Status: DC

## 2019-04-01 MED ORDER — PHENOL 1.4 % MT LIQD: 1.0000 | OROMUCOSAL | Status: DC | PRN

## 2019-04-01 MED ORDER — SODIUM CHLORIDE (PF) 0.9 % IJ SOLN
INTRAMUSCULAR | Status: AC
  Filled 2019-04-01: qty 10

## 2019-04-01 MED ORDER — TRANEXAMIC ACID-NACL 1000-0.7 MG/100ML-% IV SOLN
1000.0000 mg | INTRAVENOUS | Status: AC
  Administered 2019-04-01: 1000 mg via INTRAVENOUS

## 2019-04-01 MED ORDER — SODIUM CHLORIDE (PF) 0.9 % IJ SOLN
INTRAMUSCULAR | Status: AC
  Filled 2019-04-01: qty 50

## 2019-04-01 MED ORDER — FENTANYL CITRATE (PF) 100 MCG/2ML IJ SOLN
INTRAMUSCULAR | Status: AC
  Filled 2019-04-01: qty 2

## 2019-04-01 MED ORDER — DEXAMETHASONE SODIUM PHOSPHATE 10 MG/ML IJ SOLN: 8.0000 mg | Freq: Once | INTRAMUSCULAR | Status: DC

## 2019-04-01 MED ORDER — DEXAMETHASONE SODIUM PHOSPHATE 10 MG/ML IJ SOLN
INTRAMUSCULAR | Status: AC
  Filled 2019-04-01: qty 1

## 2019-04-01 MED ORDER — ONDANSETRON HCL 4 MG/2ML IJ SOLN: 4.0000 mg | Freq: Four times a day (QID) | INTRAMUSCULAR | Status: DC | PRN

## 2019-04-01 MED ORDER — LIDOCAINE HCL (CARDIAC) PF 100 MG/5ML IV SOSY
PREFILLED_SYRINGE | INTRAVENOUS | Status: DC | PRN
  Administered 2019-04-01: 80 mg via INTRAVENOUS

## 2019-04-01 MED ORDER — SODIUM CHLORIDE 0.9 % IV SOLN
INTRAVENOUS | Status: DC
  Administered 2019-04-01 (×2): via INTRAVENOUS

## 2019-04-01 MED ORDER — ACETAMINOPHEN 10 MG/ML IV SOLN
1000.0000 mg | Freq: Four times a day (QID) | INTRAVENOUS | Status: DC
  Administered 2019-04-01: 1000 mg via INTRAVENOUS

## 2019-04-01 MED ORDER — BISACODYL 10 MG RE SUPP: 10.0000 mg | Freq: Every day | RECTAL | Status: DC | PRN

## 2019-04-01 MED ORDER — DILTIAZEM HCL ER COATED BEADS 180 MG PO CP24
180.0000 mg | ORAL_CAPSULE | Freq: Every evening | ORAL | Status: DC
  Administered 2019-04-01: 180 mg via ORAL
  Filled 2019-04-01: qty 1

## 2019-04-01 MED ORDER — FENTANYL CITRATE (PF) 100 MCG/2ML IJ SOLN
25.0000 ug | INTRAMUSCULAR | Status: DC | PRN
  Administered 2019-04-01: 50 ug via INTRAVENOUS

## 2019-04-01 MED ORDER — ACETAMINOPHEN 500 MG PO TABS
1000.0000 mg | ORAL_TABLET | Freq: Four times a day (QID) | ORAL | Status: AC
  Administered 2019-04-01 – 2019-04-02 (×4): 1000 mg via ORAL
  Filled 2019-04-01 (×5): qty 2

## 2019-04-01 MED ORDER — MECLIZINE HCL 25 MG PO TABS: 12.5000 mg | ORAL_TABLET | Freq: Every day | ORAL | Status: DC | PRN

## 2019-04-01 MED ORDER — SODIUM CHLORIDE (PF) 0.9 % IJ SOLN
INTRAMUSCULAR | Status: DC | PRN
  Administered 2019-04-01: 60 mL

## 2019-04-01 MED ORDER — PROMETHAZINE HCL 25 MG/ML IJ SOLN: 6.2500 mg | INTRAMUSCULAR | Status: DC | PRN

## 2019-04-01 MED ORDER — PROPOFOL 500 MG/50ML IV EMUL
INTRAVENOUS | Status: DC | PRN
  Administered 2019-04-01: 100 ug/kg/min via INTRAVENOUS

## 2019-04-01 MED ORDER — LORATADINE 10 MG PO TABS: 10.0000 mg | ORAL_TABLET | Freq: Every day | ORAL | Status: DC | PRN

## 2019-04-01 MED ORDER — POVIDONE-IODINE 10 % EX SWAB
2.0000 "application " | Freq: Once | CUTANEOUS | Status: AC
  Administered 2019-04-01: 2 via TOPICAL

## 2019-04-01 MED ORDER — ONDANSETRON HCL 4 MG/2ML IJ SOLN
INTRAMUSCULAR | Status: DC | PRN
  Administered 2019-04-01: 4 mg via INTRAVENOUS

## 2019-04-01 MED ORDER — DEXAMETHASONE SODIUM PHOSPHATE 10 MG/ML IJ SOLN
10.0000 mg | Freq: Once | INTRAMUSCULAR | Status: AC
  Administered 2019-04-02: 10 mg via INTRAVENOUS
  Filled 2019-04-01: qty 1

## 2019-04-01 MED ORDER — MENTHOL 3 MG MT LOZG: 1.0000 | LOZENGE | OROMUCOSAL | Status: DC | PRN

## 2019-04-01 MED ORDER — BUPIVACAINE IN DEXTROSE 0.75-8.25 % IT SOLN
INTRATHECAL | Status: DC | PRN
  Administered 2019-04-01: 1.4 mL via INTRATHECAL

## 2019-04-01 MED ORDER — POLYETHYLENE GLYCOL 3350 17 G PO PACK: 17.0000 g | PACK | Freq: Every day | ORAL | Status: DC | PRN

## 2019-04-01 MED ORDER — DOXYCYCLINE HYCLATE 100 MG PO TABS
50.0000 mg | ORAL_TABLET | Freq: Two times a day (BID) | ORAL | Status: DC
  Administered 2019-04-01 – 2019-04-02 (×2): 50 mg via ORAL
  Filled 2019-04-01 (×3): qty 0.5

## 2019-04-01 MED ORDER — METHOCARBAMOL 500 MG IVPB - SIMPLE MED
500.0000 mg | Freq: Four times a day (QID) | INTRAVENOUS | Status: DC | PRN
  Administered 2019-04-01: 500 mg via INTRAVENOUS
  Filled 2019-04-01: qty 50

## 2019-04-01 MED ORDER — BUPIVACAINE LIPOSOME 1.3 % IJ SUSP
INTRAMUSCULAR | Status: DC | PRN
  Administered 2019-04-01: 20 mL

## 2019-04-01 MED ORDER — METHOCARBAMOL 500 MG PO TABS
500.0000 mg | ORAL_TABLET | Freq: Four times a day (QID) | ORAL | Status: DC | PRN
  Administered 2019-04-01 – 2019-04-02 (×2): 500 mg via ORAL
  Filled 2019-04-01 (×2): qty 1

## 2019-04-01 MED ORDER — ACETAMINOPHEN 10 MG/ML IV SOLN
INTRAVENOUS | Status: AC
  Filled 2019-04-01: qty 100

## 2019-04-01 MED ORDER — ASPIRIN EC 325 MG PO TBEC
325.0000 mg | DELAYED_RELEASE_TABLET | Freq: Two times a day (BID) | ORAL | Status: DC
  Administered 2019-04-02: 325 mg via ORAL
  Filled 2019-04-01: qty 1

## 2019-04-01 MED ORDER — ROPIVACAINE HCL 7.5 MG/ML IJ SOLN
INTRAMUSCULAR | Status: DC | PRN
  Administered 2019-04-01: 20 mL via PERINEURAL

## 2019-04-01 MED ORDER — CEFAZOLIN SODIUM-DEXTROSE 2-4 GM/100ML-% IV SOLN
INTRAVENOUS | Status: AC
  Filled 2019-04-01: qty 100

## 2019-04-01 MED ORDER — DIPHENHYDRAMINE HCL 12.5 MG/5ML PO ELIX: 12.5000 mg | ORAL_SOLUTION | ORAL | Status: DC | PRN

## 2019-04-01 MED ORDER — OXYCODONE HCL 5 MG PO TABS
5.0000 mg | ORAL_TABLET | ORAL | Status: DC | PRN
  Administered 2019-04-01 (×2): 5 mg via ORAL
  Filled 2019-04-01 (×3): qty 1

## 2019-04-01 MED ORDER — METOCLOPRAMIDE HCL 5 MG PO TABS: 5.0000 mg | ORAL_TABLET | Freq: Three times a day (TID) | ORAL | Status: DC | PRN

## 2019-04-01 MED ORDER — SODIUM CHLORIDE 0.9 % IR SOLN
Status: DC | PRN
  Administered 2019-04-01: 1000 mL

## 2019-04-01 MED ORDER — LIDOCAINE 2% (20 MG/ML) 5 ML SYRINGE
INTRAMUSCULAR | Status: AC
  Filled 2019-04-01: qty 5

## 2019-04-01 MED ORDER — METOCLOPRAMIDE HCL 5 MG/ML IJ SOLN: 5.0000 mg | Freq: Three times a day (TID) | INTRAMUSCULAR | Status: DC | PRN

## 2019-04-01 MED ORDER — FLEET ENEMA 7-19 GM/118ML RE ENEM: 1.0000 | ENEMA | Freq: Once | RECTAL | Status: DC | PRN

## 2019-04-01 MED ORDER — GABAPENTIN 100 MG PO CAPS
200.0000 mg | ORAL_CAPSULE | Freq: Three times a day (TID) | ORAL | Status: DC
  Administered 2019-04-01 – 2019-04-02 (×3): 200 mg via ORAL
  Filled 2019-04-01 (×3): qty 2

## 2019-04-01 MED ORDER — TRANEXAMIC ACID-NACL 1000-0.7 MG/100ML-% IV SOLN
INTRAVENOUS | Status: AC
  Filled 2019-04-01: qty 100

## 2019-04-01 MED ORDER — PROPOFOL 10 MG/ML IV BOLUS
INTRAVENOUS | Status: DC | PRN
  Administered 2019-04-01 (×2): 20 mg via INTRAVENOUS

## 2019-04-01 MED ORDER — MIDAZOLAM HCL 5 MG/5ML IJ SOLN
INTRAMUSCULAR | Status: DC | PRN
  Administered 2019-04-01: 2 mg via INTRAVENOUS

## 2019-04-01 MED ORDER — EPHEDRINE 5 MG/ML INJ
INTRAVENOUS | Status: AC
  Filled 2019-04-01: qty 10

## 2019-04-01 MED ORDER — FENTANYL CITRATE (PF) 100 MCG/2ML IJ SOLN
INTRAMUSCULAR | Status: DC | PRN
  Administered 2019-04-01 (×2): 50 ug via INTRAVENOUS

## 2019-04-01 MED ORDER — LACTATED RINGERS IV SOLN
INTRAVENOUS | Status: DC
  Administered 2019-04-01 (×2): via INTRAVENOUS

## 2019-04-01 MED ORDER — EPHEDRINE SULFATE-NACL 50-0.9 MG/10ML-% IV SOSY
PREFILLED_SYRINGE | INTRAVENOUS | Status: DC | PRN
  Administered 2019-04-01: 5 mg via INTRAVENOUS
  Administered 2019-04-01: 10 mg via INTRAVENOUS

## 2019-04-01 MED ORDER — TRAMADOL HCL 50 MG PO TABS
50.0000 mg | ORAL_TABLET | Freq: Four times a day (QID) | ORAL | Status: DC | PRN
  Administered 2019-04-02: 100 mg via ORAL
  Administered 2019-04-02: 50 mg via ORAL
  Filled 2019-04-01: qty 2
  Filled 2019-04-01: qty 1

## 2019-04-01 SURGICAL SUPPLY — 61 items
BAG SPEC THK2 15X12 ZIP CLS (MISCELLANEOUS) ×1
BAG ZIPLOCK 12X15 (MISCELLANEOUS) ×3 IMPLANT
BLADE SAG 18X100X1.27 (BLADE) ×3 IMPLANT
BLADE SAW SGTL 11.0X1.19X90.0M (BLADE) ×3 IMPLANT
BLADE SURG SZ10 CARB STEEL (BLADE) ×6 IMPLANT
BNDG ELASTIC 6X5.8 VLCR STR LF (GAUZE/BANDAGES/DRESSINGS) ×3 IMPLANT
BOWL SMART MIX CTS (DISPOSABLE) ×3 IMPLANT
CEMENT HV SMART SET (Cement) ×6 IMPLANT
CEMENT TIBIA MBT SIZE 4 (Knees) IMPLANT
CLOSURE WOUND 1/2 X4 (GAUZE/BANDAGES/DRESSINGS) ×1
COVER SURGICAL LIGHT HANDLE (MISCELLANEOUS) ×3 IMPLANT
COVER WAND RF STERILE (DRAPES) IMPLANT
CUFF TOURN SGL QUICK 34 (TOURNIQUET CUFF) ×3
CUFF TRNQT CYL 34X4.125X (TOURNIQUET CUFF) ×1 IMPLANT
DECANTER SPIKE VIAL GLASS SM (MISCELLANEOUS) ×3 IMPLANT
DRAPE U-SHAPE 47X51 STRL (DRAPES) ×3 IMPLANT
DRSG ADAPTIC 3X8 NADH LF (GAUZE/BANDAGES/DRESSINGS) ×3 IMPLANT
DRSG PAD ABDOMINAL 8X10 ST (GAUZE/BANDAGES/DRESSINGS) ×3 IMPLANT
DURAPREP 26ML APPLICATOR (WOUND CARE) ×3 IMPLANT
ELECT REM PT RETURN 15FT ADLT (MISCELLANEOUS) ×3 IMPLANT
EVACUATOR 1/8 PVC DRAIN (DRAIN) ×3 IMPLANT
FEMUR SIGMA PS SZ 4.0 R (Femur) ×2 IMPLANT
GAUZE SPONGE 4X4 12PLY STRL (GAUZE/BANDAGES/DRESSINGS) ×3 IMPLANT
GLOVE BIO SURGEON STRL SZ7 (GLOVE) ×3 IMPLANT
GLOVE BIO SURGEON STRL SZ8 (GLOVE) ×3 IMPLANT
GLOVE BIOGEL PI IND STRL 6.5 (GLOVE) ×1 IMPLANT
GLOVE BIOGEL PI IND STRL 7.0 (GLOVE) ×1 IMPLANT
GLOVE BIOGEL PI IND STRL 8 (GLOVE) ×1 IMPLANT
GLOVE BIOGEL PI INDICATOR 6.5 (GLOVE) ×2
GLOVE BIOGEL PI INDICATOR 7.0 (GLOVE) ×2
GLOVE BIOGEL PI INDICATOR 8 (GLOVE) ×2
GLOVE SURG SS PI 6.5 STRL IVOR (GLOVE) ×3 IMPLANT
GOWN STRL REUS W/TWL LRG LVL3 (GOWN DISPOSABLE) ×9 IMPLANT
HANDPIECE INTERPULSE COAX TIP (DISPOSABLE) ×3
HOLDER FOLEY CATH W/STRAP (MISCELLANEOUS) IMPLANT
IMMOBILIZER KNEE 20 (SOFTGOODS) ×3
IMMOBILIZER KNEE 20 THIGH 36 (SOFTGOODS) ×1 IMPLANT
KIT TURNOVER KIT A (KITS) IMPLANT
MANIFOLD NEPTUNE II (INSTRUMENTS) ×3 IMPLANT
NS IRRIG 1000ML POUR BTL (IV SOLUTION) ×3 IMPLANT
PACK TOTAL KNEE CUSTOM (KITS) ×3 IMPLANT
PADDING CAST COTTON 6X4 STRL (CAST SUPPLIES) ×9 IMPLANT
PADDING CAST SYN 6 (CAST SUPPLIES) ×2
PADDING CAST SYNTHETIC 6X4 NS (CAST SUPPLIES) IMPLANT
PATELLA DOME PFC 41MM (Knees) ×2 IMPLANT
PENCIL SMOKE EVACUATOR (MISCELLANEOUS) IMPLANT
PIN STEINMAN FIXATION KNEE (PIN) ×2 IMPLANT
PLATE ROT INSERT 10MM SIZE 4 (Plate) ×2 IMPLANT
PROTECTOR NERVE ULNAR (MISCELLANEOUS) ×5 IMPLANT
SET HNDPC FAN SPRY TIP SCT (DISPOSABLE) ×1 IMPLANT
STRIP CLOSURE SKIN 1/2X4 (GAUZE/BANDAGES/DRESSINGS) ×3 IMPLANT
SUT MNCRL AB 4-0 PS2 18 (SUTURE) ×3 IMPLANT
SUT STRATAFIX 0 PDS 27 VIOLET (SUTURE) ×3
SUT VIC AB 2-0 CT1 27 (SUTURE) ×9
SUT VIC AB 2-0 CT1 TAPERPNT 27 (SUTURE) ×3 IMPLANT
SUTURE STRATFX 0 PDS 27 VIOLET (SUTURE) ×1 IMPLANT
TIBIA MBT CEMENT SIZE 4 (Knees) ×3 IMPLANT
TRAY FOLEY MTR SLVR 16FR STAT (SET/KITS/TRAYS/PACK) ×3 IMPLANT
WATER STERILE IRR 1000ML POUR (IV SOLUTION) ×6 IMPLANT
WRAP KNEE MAXI GEL POST OP (GAUZE/BANDAGES/DRESSINGS) ×3 IMPLANT
YANKAUER SUCT BULB TIP 10FT TU (MISCELLANEOUS) ×3 IMPLANT

## 2019-04-01 NOTE — Care Plan (Addendum)
Ortho Bundle Case Management Note  Patient Details  Name: EDMOND GONNERING MRN: [id number] Date of Birth: Oct 12, 1940  R TKA on 04-01-19 DCP:  Home with spouse.  1 story home with 1 ste.  DME:  RW and 3-in-1 ordered through [place] PT:  [facility].  PT eval scheduled on 04-08-19.           DME Arranged:  3-N-1, Walker rolling DME Agency:  [employer]  HH Arranged:  NA HH Agency:  NA  Additional Comments: Please contact me with any questions of if this plan should need to change.  Marianne Sofia, RN,CCM [facility]  [phone] 04/01/2019, 2:04 PM

## 2019-04-01 NOTE — Transfer of Care (Signed)
Immediate Anesthesia Transfer of Care Note  Patient: John Austin  Procedure(s) Performed: TOTAL KNEE ARTHROPLASTY (Right Knee)  Patient Location: PACU  Anesthesia Type:Regional and Spinal  Level of Consciousness: awake, alert  and oriented  Airway & Oxygen Therapy: Patient Spontanous Breathing and Patient connected to face mask oxygen  Post-op Assessment: Report given to RN and Post -op Vital signs reviewed and stable  Post vital signs: Reviewed and stable  Last Vitals:  Vitals Value Taken Time  BP    Temp    Pulse 74 04/01/19 0852  Resp    SpO2 99 % 04/01/19 0852    Last Pain:  Vitals:   04/01/19 0542  TempSrc: Oral  PainSc:       Patients Stated Pain Goal: 3 ([id number])  Complications: No apparent anesthesia complications

## 2019-04-01 NOTE — Interval H&P Note (Signed)
History and Physical Interval Note:  04/01/2019 6:53 AM  Coralyn Mark  has presented today for surgery, with the diagnosis of right knee osteoarthritis.  The various methods of treatment have been discussed with the patient and family. After consideration of risks, benefits and other options for treatment, the patient has consented to  Procedure(s) with comments: TOTAL KNEE ARTHROPLASTY (Right) - 24min as a surgical intervention.  The patient's history has been reviewed, patient examined, no change in status, stable for surgery.  I have reviewed the patient's chart and labs.  Questions were answered to the patient's satisfaction.     Pilar Plate Natan Hartog

## 2019-04-01 NOTE — Anesthesia Procedure Notes (Signed)
Spinal  Patient location during procedure: OR Start time: 04/01/2019 7:11 AM End time: 04/01/2019 7:21 AM Staffing Anesthesiologist: Duane Boston, MD Performed: anesthesiologist  Preanesthetic Checklist Completed: patient identified, surgical consent, pre-op evaluation, timeout performed, IV checked, risks and benefits discussed and monitors and equipment checked Spinal Block Patient position: sitting Prep: DuraPrep Patient monitoring: cardiac monitor, continuous pulse ox and blood pressure Approach: left paramedian Location: L2-3 Injection technique: single-shot Needle Needle type: Pencan  Needle gauge: 24 G Needle length: 9 cm Additional Notes Functioning IV was confirmed and monitors were applied. Sterile prep and drape, including hand hygiene and sterile gloves were used. The patient was positioned and the spine was prepped. The skin was anesthetized with lidocaine.  Free flow of clear CSF was obtained prior to injecting local anesthetic into the CSF.  The spinal needle aspirated freely following injection.  The needle was carefully withdrawn.  The patient tolerated the procedure well.

## 2019-04-01 NOTE — Evaluation (Signed)
Physical Therapy Evaluation Patient Details [name] MRN: [id number] DOB: 1940/05/21 Today's Date: 04/01/2019   History of Present Illness  Patient is 78 y.o. s/p Rt TKA on 04/01/19 with PMH significant for GERD, HTN, OA, LT TKA in 2018.  Clinical Impression  John Austin is a 78 y.o. male POD 0 s/p Rt TKA. Patient reports independence with mobility at baseline. Patient is now limited by functional impairments (see PT problem list below) and requires min assist for transfers and gait with RW. Patient was able to ambulate ~75 feet with RW and min assist with cues for safe walker management. Patient instructed in exercise to facilitate ROM and circulation. Patient will benefit from continued skilled PT interventions to address impairments and progress towards PLOF. Acute PT will follow to progress mobility and stair training in preparation for safe discharge home.    Follow Up Recommendations Follow surgeon's recommendation for DC plan and follow-up therapies    Equipment Recommendations  Rolling walker with 5" wheels;3in1 (PT)    Recommendations for Other Services       Precautions / Restrictions Precautions Precautions: Fall Restrictions Weight Bearing Restrictions: No      Mobility  Bed Mobility Overal bed mobility: Needs Assistance Bed Mobility: Supine to Sit     Supine to sit: HOB elevated;Supervision     General bed mobility comments: no assistance requried, cues to scoot to EOB, pt using bed rails  Transfers Overall transfer level: Needs assistance Equipment used: Rolling walker (2 wheeled) Transfers: Sit to/from Stand Sit to Stand: Min assist;From elevated surface         General transfer comment: cues for hand placement and technique with RW, assist to initiate power up and steady with rising  Ambulation/Gait Ambulation/Gait assistance: Min assist Gait Distance (Feet): 75 Feet Assistive device: Rolling walker (2 wheeled) Gait  Pattern/deviations: Step-through pattern;Decreased step length - left;Decreased stride length;Decreased stance time - right Gait velocity: decreased   General Gait Details: min assist at start to assess Rt knee stability in weight bearing and prevent buckling, pt with good use of UE's to prevent knee buckling, cues needed for safe management/proximity to RW, no overt LOB noted  Stairs            Wheelchair Mobility    Modified Rankin (Stroke Patients Only)       Balance Overall balance assessment: Needs assistance Sitting-balance support: Feet supported Sitting balance-Leahy Scale: Good     Standing balance support: During functional activity;Bilateral upper extremity supported Standing balance-Leahy Scale: Poor                Pertinent Vitals/Pain Pain Assessment: 0-10 Pain Score: 5  Pain Location: Rt knee Pain Descriptors / Indicators: Aching Pain Intervention(s): Limited activity within patient's tolerance;Monitored during session;Repositioned    Home Living Family/patient expects to be discharged to:: Private residence Living Arrangements: Spouse/significant other Available Help at Discharge: Family;Available 24 hours/day Type of Home: House Home Access: Stairs to enter Entrance Stairs-Rails: None Entrance Stairs-Number of Steps: 1 step from garage no rails Home Layout: One level Home Equipment: Shower seat      Prior Function Level of Independence: Independent               Hand Dominance   Dominant Hand: Right    Extremity/Trunk Assessment   Upper Extremity Assessment Upper Extremity Assessment: Overall WFL for tasks assessed    Lower Extremity Assessment Lower Extremity Assessment: Overall WFL for tasks assessed;RLE deficits/detail RLE Deficits / Details: pt  with good quad activation in supine, no extensor lag with SLR RLE Sensation: WNL RLE Coordination: WNL    Cervical / Trunk Assessment Cervical / Trunk Assessment: Normal   Communication   Communication: No difficulties  Cognition Arousal/Alertness: Awake/alert Behavior During Therapy: WFL for tasks assessed/performed Overall Cognitive Status: Within Functional Limits for tasks assessed           General Comments      Exercises Total Joint Exercises Ankle Circles/Pumps: AROM;15 reps;Seated;Both Quad Sets: AROM;10 reps;Seated;Right Heel Slides: AAROM;10 reps;Seated;Right   Assessment/Plan    PT Assessment Patient needs continued PT services  PT Problem List Decreased strength;Decreased balance;Decreased range of motion;Decreased mobility;Decreased activity tolerance;Decreased knowledge of use of DME       PT Treatment Interventions DME instruction;Functional mobility training;Balance training;Patient/family education;Modalities;Gait training;Therapeutic exercise;Stair training;Therapeutic activities    PT Goals (Current goals can be found in the Care Plan section)  Acute Rehab PT Goals Patient Stated Goal: to get back home PT Goal Formulation: With patient Time For Goal Achievement: 04/08/19 Potential to Achieve Goals: Good    Frequency 7X/week    AM-PAC PT "6 Clicks" Mobility  Outcome Measure Help needed turning from your back to your side while in a flat bed without using bedrails?: A Little Help needed moving from lying on your back to sitting on the side of a flat bed without using bedrails?: A Little Help needed moving to and from a bed to a chair (including a wheelchair)?: A Little Help needed standing up from a chair using your arms (e.g., wheelchair or bedside chair)?: A Little Help needed to walk in hospital room?: A Little Help needed climbing 3-5 steps with a railing? : A Little 6 Click Score: 18    End of Session Equipment Utilized During Treatment: Gait belt Activity Tolerance: Patient tolerated treatment well Patient left: in chair;with call bell/phone within reach;with chair alarm set Nurse Communication: Mobility  status PT Visit Diagnosis: Muscle weakness (generalized) (M62.81);Difficulty in walking, not elsewhere classified (R26.2)    Time: 1330-1405 PT Time Calculation (min) (ACUTE ONLY): 35 min   Charges:   PT Evaluation $PT Eval Low Complexity: 1 Low PT Treatments $Therapeutic Exercise: 8-22 mins       Kipp Brood, PT, DPT Physical Therapist with [facility]  04/01/2019 7:12 PM

## 2019-04-01 NOTE — Anesthesia Procedure Notes (Signed)
Anesthesia Regional Block: Adductor canal block   Pre-Anesthetic Checklist: ,, timeout performed, Correct Patient, Correct Site, Correct Laterality, Correct Procedure, Correct Position, site marked, Risks and benefits discussed,  Surgical consent,  Pre-op evaluation,  At surgeon's request and post-op pain management  Laterality: Right  Prep: chloraprep       Needles:  Injection technique: Single-shot  Needle Type: Stimulator Needle - 80     Needle Length: 10cm  Needle Gauge: 21     Additional Needles:   Narrative:  Start time: 04/01/2019 6:52 AM End time: 04/01/2019 7:03 AM Injection made incrementally with aspirations every 5 mL.  Performed by: Personally

## 2019-04-01 NOTE — Anesthesia Preprocedure Evaluation (Signed)
Anesthesia Evaluation  Patient identified by MRN, date of birth, ID band Patient awake    Reviewed: Allergy & Precautions, NPO status , Patient's Chart, lab work & pertinent test results  History of Anesthesia Complications (+) PONV and history of anesthetic complications  Airway Mallampati: II  TM Distance: >3 FB Neck ROM: Full    Dental no notable dental hx. (+) Dental Advisory Given   Pulmonary former smoker,    Pulmonary exam normal        Cardiovascular hypertension, Normal cardiovascular exam     Neuro/Psych negative neurological ROS  negative psych ROS   GI/Hepatic Neg liver ROS, hiatal hernia, GERD  ,  Endo/Other  negative endocrine ROS  Renal/GU negative Renal ROS     Musculoskeletal   Abdominal   Peds  Hematology   Anesthesia Other Findings   Reproductive/Obstetrics                             Anesthesia Physical Anesthesia Plan  ASA: III  Anesthesia Plan: Spinal   Post-op Pain Management:    Induction: Intravenous  PONV Risk Score and Plan: 2 and Ondansetron and Propofol infusion  Airway Management Planned: Simple Face Mask and Natural Airway  Additional Equipment:   Intra-op Plan:   Post-operative Plan:   Informed Consent: I have reviewed the patients History and Physical, chart, labs and discussed the procedure including the risks, benefits and alternatives for the proposed anesthesia with the patient or authorized representative who has indicated his/her understanding and acceptance.     Dental advisory given  Plan Discussed with: CRNA, Anesthesiologist and Surgeon  Anesthesia Plan Comments:         Anesthesia Quick Evaluation

## 2019-04-01 NOTE — Discharge Instructions (Signed)
° °Dr. Frank Aluisio °Total Joint Specialist °[facility] °[address] °[place], [place] [address] °[phone] ° °TOTAL KNEE REPLACEMENT POSTOPERATIVE DIRECTIONS ° °Knee Rehabilitation, Guidelines Following Surgery  °Results after knee surgery are often greatly improved when you follow the exercise, range of motion and muscle strengthening exercises prescribed by your doctor. Safety measures are also important to protect the knee from further injury. Any time any of these exercises cause you to have increased pain or swelling in your knee joint, decrease the amount until you are comfortable again and slowly increase them. If you have problems or questions, call your caregiver or physical therapist for advice.  ° °HOME CARE INSTRUCTIONS  °• Remove items at home which could result in a fall. This includes throw rugs or furniture in walking pathways.  °· ICE to the affected knee every three hours for 30 minutes at a time and then as needed for pain and swelling.  Continue to use ice on the knee for pain and swelling from surgery. You may notice swelling that will progress down to the foot and ankle.  This is normal after surgery.  Elevate the leg when you are not up walking on it.   °· Continue to use the breathing machine which will help keep your temperature down.  It is common for your temperature to cycle up and down following surgery, especially at night when you are not up moving around and exerting yourself.  The breathing machine keeps your lungs expanded and your temperature down. °· Do not place pillow under knee, focus on keeping the knee straight while resting ° °DIET °You may resume your previous home diet once your are discharged from the hospital. ° °DRESSING / WOUND CARE / SHOWERING °You may change your dressing 3-5 days after surgery.  Then change the dressing every day with sterile gauze.  Please use good hand washing techniques before changing the dressing.  Do not use any lotions  or creams on the incision until instructed by your surgeon. °You may start showering once you are discharged home but do not submerge the incision under water. Just pat the incision dry and apply a dry gauze dressing on daily. °Change the surgical dressing daily and reapply a dry dressing each time. ° °ACTIVITY °Walk with your walker as instructed. °Use walker as long as suggested by your caregivers. °Avoid periods of inactivity such as sitting longer than an hour when not asleep. This helps prevent blood clots.  °You may resume a sexual relationship in one month or when given the OK by your doctor.  °You may return to work once you are cleared by your doctor.  °Do not drive a car for 6 weeks or until released by you surgeon.  °Do not drive while taking narcotics. ° °WEIGHT BEARING °Weight bearing as tolerated with assist device (walker, cane, etc) as directed, use it as long as suggested by your surgeon or therapist, typically at least 4-6 weeks. ° °POSTOPERATIVE CONSTIPATION PROTOCOL °Constipation - defined medically as fewer than three stools per week and severe constipation as less than one stool per week. ° °One of the most common issues patients have following surgery is constipation.  Even if you have a regular bowel pattern at home, your normal regimen is likely to be disrupted due to multiple reasons following surgery.  Combination of anesthesia, postoperative narcotics, change in appetite and fluid intake all can affect your bowels.  In order to avoid complications following surgery, here are some   recommendations in order to help you during your recovery period. ° °Colace (docusate) - Pick up an over-the-counter form of Colace or another stool softener and take twice a day as long as you are requiring postoperative pain medications.  Take with a full glass of water daily.  If you experience loose stools or diarrhea, hold the colace until you stool forms back up.  If your symptoms do not get better within 1  week or if they get worse, check with your doctor. ° °Dulcolax (bisacodyl) - Pick up over-the-counter and take as directed by the product packaging as needed to assist with the movement of your bowels.  Take with a full glass of water.  Use this product as needed if not relieved by Colace only.  ° °MiraLax (polyethylene glycol) - Pick up over-the-counter to have on hand.  MiraLax is a solution that will increase the amount of water in your bowels to assist with bowel movements.  Take as directed and can mix with a glass of water, juice, soda, coffee, or tea.  Take if you go more than two days without a movement. °Do not use MiraLax more than once per day. Call your doctor if you are still constipated or irregular after using this medication for 7 days in a row. ° °If you continue to have problems with postoperative constipation, please contact the office for further assistance and recommendations.  If you experience "the worst abdominal pain ever" or develop nausea or vomiting, please contact the office immediatly for further recommendations for treatment. ° °ITCHING °If you experience itching with your medications, try taking only a single pain pill, or even half a pain pill at a time.  You can also use Benadryl over the counter for itching or also to help with sleep.  ° °TED HOSE STOCKINGS °Wear the elastic stockings on both legs for three weeks following surgery during the day but you may remove then at night for sleeping. ° °MEDICATIONS °See your medication summary on the “After Visit Summary” that the nursing staff will review with you prior to discharge.  You may have some home medications which will be placed on hold until you complete the course of blood thinner medication.  It is important for you to complete the blood thinner medication as prescribed by your surgeon.  Continue your approved medications as instructed at time of discharge. ° °PRECAUTIONS °If you experience chest pain or shortness of breath -  call 911 immediately for transfer to the hospital emergency department.  °If you develop a fever greater that 101 F, purulent drainage from wound, increased redness or drainage from wound, foul odor from the wound/dressing, or calf pain - CONTACT YOUR SURGEON.   °                                                °FOLLOW-UP APPOINTMENTS °Make sure you keep all of your appointments after your operation with your surgeon and caregivers. You should call the office at the above phone number and make an appointment for approximately two weeks after the date of your surgery or on the date instructed by your surgeon outlined in the "After Visit Summary". ° °RANGE OF MOTION AND STRENGTHENING EXERCISES  °Rehabilitation of the knee is important following a knee injury or an operation. After just a few days of immobilization, the muscles of   the thigh which control the knee become weakened and shrink (atrophy). Knee exercises are designed to build up the tone and strength of the thigh muscles and to improve knee motion. Often times heat used for twenty to thirty minutes before working out will loosen up your tissues and help with improving the range of motion but do not use heat for the first two weeks following surgery. These exercises can be done on a training (exercise) mat, on the floor, on a table or on a bed. Use what ever works the best and is most comfortable for you Knee exercises include:  °• Leg Lifts - While your knee is still immobilized in a splint or cast, you can do straight leg raises. Lift the leg to 60 degrees, hold for 3 sec, and slowly lower the leg. Repeat 10-20 times 2-3 times daily. Perform this exercise against resistance later as your knee gets better.  °• Quad and Hamstring Sets - Tighten up the muscle on the front of the thigh (Quad) and hold for 5-10 sec. Repeat this 10-20 times hourly. Hamstring sets are done by pushing the foot backward against an object and holding for 5-10 sec. Repeat as with quad  sets.  °· Leg Slides: Lying on your back, slowly slide your foot toward your buttocks, bending your knee up off the floor (only go as far as is comfortable). Then slowly slide your foot back down until your leg is flat on the floor again. °· Angel Wings: Lying on your back spread your legs to the side as far apart as you can without causing discomfort.  °A rehabilitation program following serious knee injuries can speed recovery and prevent re-injury in the future due to weakened muscles. Contact your doctor or a physical therapist for more information on knee rehabilitation.  ° °IF YOU ARE TRANSFERRED TO A SKILLED REHAB FACILITY °If the patient is transferred to a skilled rehab facility following release from the hospital, a list of the current medications will be sent to the facility for the patient to continue.  When discharged from the skilled rehab facility, please have the facility set up the patient's Home Health Physical Therapy prior to being released. Also, the skilled facility will be responsible for providing the patient with their medications at time of release from the facility to include their pain medication, the muscle relaxants, and their blood thinner medication. If the patient is still at the rehab facility at time of the two week follow up appointment, the skilled rehab facility will also need to assist the patient in arranging follow up appointment in our office and any transportation needs. ° °MAKE SURE YOU:  °• Understand these instructions.  °• Get help right away if you are not doing well or get worse.  ° ° °Pick up stool softner and laxative for home use following surgery while on pain medications. °Do not submerge incision under water. °Please use good hand washing techniques while changing dressing each day. °May shower starting three days after surgery. °Please use a clean towel to pat the incision dry following showers. °Continue to use ice for pain and swelling after surgery. °Do not  use any lotions or creams on the incision until instructed by your surgeon. ° °

## 2019-04-01 NOTE — Op Note (Signed)
OPERATIVE REPORT-TOTAL KNEE ARTHROPLASTY   Pre-operative diagnosis- Osteoarthritis  Right knee(s)  Post-operative diagnosis- Osteoarthritis Right knee(s)  Procedure-  Right  Total Knee Arthroplasty  Surgeon- Dione Plover. Tajuanna Burnett, MD  Assistant- Theresa Duty, PA-C   Anesthesia-  Adductor canal block and spinal  EBL-25 mL   Drains Hemovac  Tourniquet time-  Total Tourniquet Time Documented: Thigh (Right) - 41 minutes Total: Thigh (Right) - 41 minutes     Complications- None  Condition-PACU - hemodynamically stable.   Brief Clinical Note  John Austin is a 78 y.o. year old male with end stage OA of her right knee with progressively worsening pain and dysfunction. She has constant pain, with activity and at rest and significant functional deficits with difficulties even with ADLs. She has had extensive non-op management including analgesics, injections of cortisone and viscosupplements, and home exercise program, but remains in significant pain with significant dysfunction.Radiographs show bone on bone arthritis lateral and patellofemoral. She presents now for right Total Knee Arthroplasty.    Procedure in detail---   The patient is brought into the operating room and positioned supine on the operating table. After successful administration of  Adductor canal block and spinal,   a tourniquet is placed high on the  Right thigh(s) and the lower extremity is prepped and draped in the usual sterile fashion. Time out is performed by the operating team and then the  Right lower extremity is wrapped in Esmarch, knee flexed and the tourniquet inflated to 300 mmHg.       A midline incision is made with a ten blade through the subcutaneous tissue to the level of the extensor mechanism. A fresh blade is used to make a medial parapatellar arthrotomy. Soft tissue over the proximal medial tibia is subperiosteally elevated to the joint line with a knife and into the semimembranosus bursa with a  Cobb elevator. Soft tissue over the proximal lateral tibia is elevated with attention being paid to avoiding the patellar tendon on the tibial tubercle. The patella is everted, knee flexed 90 degrees and the ACL and PCL are removed. Findings are bone on bone lateral and patellofemoral with large global osteophytes.       The drill is used to create a starting hole in the distal femur and the canal is thoroughly irrigated with sterile saline to remove the fatty contents. The 5 degree Right  valgus alignment guide is placed into the femoral canal and the distal femoral cutting block is pinned to remove 10 mm off the distal femur. Resection is made with an oscillating saw.      The tibia is subluxed forward and the menisci are removed. The extramedullary alignment guide is placed referencing proximally at the medial aspect of the tibial tubercle and distally along the second metatarsal axis and tibial crest. The block is pinned to remove 66mm off the more deficient lateral  side. Resection is made with an oscillating saw. Size 4is the most appropriate size for the tibia and the proximal tibia is prepared with the modular drill and keel punch for that size.      The femoral sizing guide is placed and size 4 is most appropriate. Rotation is marked off the epicondylar axis and confirmed by creating a rectangular flexion gap at 90 degrees. The size 4 cutting block is pinned in this rotation and the anterior, posterior and chamfer cuts are made with the oscillating saw. The intercondylar block is then placed and that cut is made.  Trial size 4 tibial component, trial size 4 posterior stabilized femur and a 12.5  mm posterior stabilized rotating platform insert trial is placed. Full extension is achieved with excellent varus/valgus and anterior/posterior balance throughout full range of motion. The patella is everted and thickness measured to be 27  mm. Free hand resection is taken to 15 mm, a 41 template is placed,  lug holes are drilled, trial patella is placed, and it tracks normally. Osteophytes are removed off the posterior femur with the trial in place. All trials are removed and the cut bone surfaces prepared with pulsatile lavage. Cement is mixed and once ready for implantation, the size 4 tibial implant, size  4 posterior stabilized femoral component, and the size 41 patella are cemented in place and the patella is held with the clamp. The trial insert is placed and the knee held in full extension. The Exparel (20 ml mixed with 60 ml saline) is injected into the extensor mechanism, posterior capsule, medial and lateral gutters and subcutaneous tissues.  All extruded cement is removed and once the cement is hard the permanent 12.5 mm posterior stabilized rotating platform insert is placed into the tibial tray.      The wound is copiously irrigated with saline solution and the extensor mechanism closed over a hemovac drain with #1 V-loc suture. The tourniquet is released for a total tourniquet time of 41  minutes. Flexion against gravity is 140 degrees and the patella tracks normally. Subcutaneous tissue is closed with 2.0 vicryl and subcuticular with running 4.0 Monocryl. The incision is cleaned and dried and steri-strips and a bulky sterile dressing are applied. The limb is placed into a knee immobilizer and the patient is awakened and transported to recovery in stable condition.      Please note that a surgical assistant was a medical necessity for this procedure in order to perform it in a safe and expeditious manner. Surgical assistant was necessary to retract the ligaments and vital neurovascular structures to prevent injury to them and also necessary for proper positioning of the limb to allow for anatomic placement of the prosthesis.   Dione Plover Raffaella Edison, MD    04/01/2019, 10:41 AM

## 2019-04-01 NOTE — Anesthesia Postprocedure Evaluation (Signed)
Anesthesia Post Note  Patient: John Austin  Procedure(s) Performed: TOTAL KNEE ARTHROPLASTY (Right Knee)     Patient location during evaluation: PACU Anesthesia Type: Spinal and MAC Level of consciousness: awake and alert Pain management: pain level controlled Vital Signs Assessment: post-procedure vital signs reviewed and stable Respiratory status: spontaneous breathing and respiratory function stable Cardiovascular status: blood pressure returned to baseline and stable Postop Assessment: spinal receding Anesthetic complications: no    Last Vitals:  Vitals:   04/01/19 1015 04/01/19 1036  BP: 122/63 115/68  Pulse: (!) 58 (!) 58  Resp: 13 14  Temp: 36.6 C   SpO2: 93% 95%    Last Pain:  Vitals:   04/01/19 1015  TempSrc:   PainSc: Asleep                 Conchita Truxillo,[name]

## 2019-04-02 ENCOUNTER — Encounter (HOSPITAL_COMMUNITY): Payer: Self-pay | Admitting: Orthopedic Surgery

## 2019-04-02 DIAGNOSIS — K219 Gastro-esophageal reflux disease without esophagitis: Secondary | ICD-10-CM | POA: Diagnosis not present

## 2019-04-02 DIAGNOSIS — I10 Essential (primary) hypertension: Secondary | ICD-10-CM | POA: Diagnosis not present

## 2019-04-02 DIAGNOSIS — M1711 Unilateral primary osteoarthritis, right knee: Secondary | ICD-10-CM | POA: Diagnosis not present

## 2019-04-02 DIAGNOSIS — R7303 Prediabetes: Secondary | ICD-10-CM | POA: Diagnosis not present

## 2019-04-02 DIAGNOSIS — Z7982 Long term (current) use of aspirin: Secondary | ICD-10-CM | POA: Diagnosis not present

## 2019-04-02 DIAGNOSIS — Z96652 Presence of left artificial knee joint: Secondary | ICD-10-CM | POA: Diagnosis not present

## 2019-04-02 LAB — CBC
HCT: 39 % (ref 39.0–52.0)
Hemoglobin: 12.5 g/dL — ABNORMAL LOW (ref 13.0–17.0)
MCH: 26.9 pg (ref 26.0–34.0)
MCHC: 32.1 g/dL (ref 30.0–36.0)
MCV: 84.1 fL (ref 80.0–100.0)
Platelets: 244 10*3/uL (ref 150–400)
RBC: 4.64 MIL/uL (ref 4.22–5.81)
RDW: 14.5 % (ref 11.5–15.5)
WBC: 15.4 10*3/uL — ABNORMAL HIGH (ref 4.0–10.5)
nRBC: 0 % (ref 0.0–0.2)

## 2019-04-02 LAB — BASIC METABOLIC PANEL
Anion gap: 8 (ref 5–15)
BUN: 10 mg/dL (ref 8–23)
CO2: 24 mmol/L (ref 22–32)
Calcium: 8.5 mg/dL — ABNORMAL LOW (ref 8.9–10.3)
Chloride: 106 mmol/L (ref 98–111)
Creatinine, Ser: 0.59 mg/dL — ABNORMAL LOW (ref 0.61–1.24)
GFR calc Af Amer: >60 mL/min (ref >60)
GFR calc non Af Amer: >60 mL/min (ref >60)
Glucose, Bld: 137 mg/dL — ABNORMAL HIGH (ref 70–99)
Potassium: 3.9 mmol/L (ref 3.5–5.1)
Sodium: 138 mmol/L (ref 135–145)

## 2019-04-02 MED ORDER — GABAPENTIN 100 MG PO CAPS: 200.0000 mg | ORAL_CAPSULE | Freq: Three times a day (TID) | ORAL | 0 refills | Status: AC

## 2019-04-02 MED ORDER — OXYCODONE HCL 5 MG PO TABS: 5.0000 mg | ORAL_TABLET | Freq: Four times a day (QID) | ORAL | 0 refills | Status: AC | PRN

## 2019-04-02 MED ORDER — TRAMADOL HCL 50 MG PO TABS: 50.0000 mg | ORAL_TABLET | Freq: Four times a day (QID) | ORAL | 0 refills | Status: AC | PRN

## 2019-04-02 MED ORDER — METHOCARBAMOL 500 MG PO TABS: 500.0000 mg | ORAL_TABLET | Freq: Four times a day (QID) | ORAL | 0 refills | Status: AC | PRN

## 2019-04-02 MED ORDER — ASPIRIN 325 MG PO TBEC: 325.0000 mg | DELAYED_RELEASE_TABLET | Freq: Two times a day (BID) | ORAL | 0 refills | Status: AC

## 2019-04-02 NOTE — Progress Notes (Addendum)
Physical Therapy Treatment Patient Details [name] MRN: [id number] DOB: [birth year] Today's Date: 04/02/2019    History of Present Illness Patient is 78 y.o. s/p Rt TKA on 04/01/19 with PMH significant for GERD, HTN, OA, LT TKA in 2018.    PT Comments    Progressing with mobility. All education completed. Instructed pt to perform TKA exercises 2x/day until OP PT begins. Okay to d/c from PT standpoint.    Follow Up Recommendations  Follow surgeon's recommendation for DC plan and follow-up therapies     Equipment Recommendations  Rolling walker with 5" wheels;3in1 (PT)    Recommendations for Other Services       Precautions / Restrictions Precautions Precautions: Fall Restrictions Weight Bearing Restrictions: No Other Position/Activity Restrictions: WBAT    Mobility  Bed Mobility Overal bed mobility: Needs Assistance Bed Mobility: Supine to Sit     Supine to sit: Min guard;HOB elevated     General bed mobility comments: oob in recliner  Transfers Overall transfer level: Needs assistance Equipment used: Rolling walker (2 wheeled) Transfers: Sit to/from Stand Sit to Stand: Min guard         General transfer comment: for safety. VCs safety, hand placement  Ambulation/Gait Ambulation/Gait assistance: Min guard Gait Distance (Feet): 85 Feet Assistive device: Rolling walker (2 wheeled) Gait Pattern/deviations: Step-through pattern;Decreased step length - left;Decreased stride length;Decreased stance time - right     General Gait Details: close guard for safety. VCs safety, sequence, distance from RW. No knee buckling observed this session.   Stairs             Wheelchair Mobility    Modified Rankin (Stroke Patients Only)       Balance Overall balance assessment: Needs assistance         Standing balance support: Bilateral upper extremity supported Standing balance-Leahy Scale: Poor                               Cognition Arousal/Alertness: Awake/alert Behavior During Therapy: WFL for tasks assessed/performed Overall Cognitive Status: Within Functional Limits for tasks assessed                                        Exercises    General Comments        Pertinent Vitals/Pain Pain Assessment: 0-10 Pain Score: 7  Pain Location: R knee Pain Descriptors / Indicators: Discomfort;Sore Pain Intervention(s): Monitored during session    Home Living                      Prior Function            PT Goals (current goals can now be found in the care plan section) Progress towards PT goals: Progressing toward goals    Frequency    7X/week      PT Plan Current plan remains appropriate    Co-evaluation              AM-PAC PT "6 Clicks" Mobility   Outcome Measure  Help needed turning from your back to your side while in a flat bed without using bedrails?: A Little Help needed moving from lying on your back to sitting on the side of a flat bed without using bedrails?: A Little Help needed moving to and from a bed to a chair (including  a wheelchair)?: A Little Help needed standing up from a chair using your arms (e.g., wheelchair or bedside chair)?: A Little Help needed to walk in hospital room?: A Little Help needed climbing 3-5 steps with a railing? : A Little 6 Click Score: 18    End of Session Equipment Utilized During Treatment: Gait belt Activity Tolerance: Patient tolerated treatment well Patient left: in chair;with call bell/phone within reach   PT Visit Diagnosis: Muscle weakness (generalized) (M62.81);Difficulty in walking, not elsewhere classified (R26.2)     Time: [id number] PT Time Calculation (min) (ACUTE ONLY): 11 min  Charges:  $Gait Training: 8-22 mins               Weston Anna, PT Acute Rehabilitation Services Pager: [phone] Office: [phone]

## 2019-04-02 NOTE — Progress Notes (Signed)
   Subjective: 1 Day Post-Op Procedure(s) (LRB): TOTAL KNEE ARTHROPLASTY (Right) Patient reports pain as mild.   Patient seen in rounds by Dr. Wynelle Link. Patient is well, and has had no acute complaints or problems other than pain in the right knee. Denies chest pain, SOB, or calf pain. No issues overnight. Foley catheter to be removed this AM. We will continue therapy today.   Objective: Vital signs in last 24 hours: Temp:  [97.6 F (36.4 C)-98.2 F (36.8 C)] 97.9 F (36.6 C) (11/24 0626) Pulse Rate:  [54-74] 56 (11/24 0626) Resp:  [12-19] 19 (11/24 0626) BP: (109-140)/(37-80) 121/65 (11/24 0626) SpO2:  [93 %-100 %] 94 % (11/24 0626)  Intake/Output from previous day:  Intake/Output Summary (Last 24 hours) at 04/02/2019 0719 Last data filed at 04/02/2019 0600 Gross per 24 hour  Intake 4670.2 ml  Output 3905 ml  Net 765.2 ml    Labs: Recent Labs    04/02/19 0317  HGB 12.5*   Recent Labs    04/02/19 0317  WBC 15.4*  RBC 4.64  HCT 39.0  PLT 244   Recent Labs    04/02/19 0317  NA 138  K 3.9  CL 106  CO2 24  BUN 10  CREATININE 0.59*  GLUCOSE 137*  CALCIUM 8.5*   Exam: General - Patient is Alert and Oriented Extremity - Neurologically intact Neurovascular intact Sensation intact distally Dorsiflexion/Plantar flexion intact Dressing - dressing C/D/I Motor Function - intact, moving foot and toes well on exam.   Past Medical History:  Diagnosis Date  . Arthritis   . Cancer (HCC)    hx of skin cancer   . Complication of anesthesia   . Essential hypertension   . GERD (gastroesophageal reflux disease)   . History of hiatal hernia   . PONV (postoperative nausea and vomiting)    per pt "slow to wake, difficult to put to sleep"   . Pre-diabetes     Assessment/Plan: 1 Day Post-Op Procedure(s) (LRB): TOTAL KNEE ARTHROPLASTY (Right) Active Problems:   OA (osteoarthritis) of knee  Estimated body mass index is 28.34 kg/m as calculated from the following:    Height as of this encounter: 5\' 11"  (1.803 m).   Weight as of this encounter: 92.2 kg. Advance diet Up with therapy D/C IV fluids  Anticipated LOS equal to or greater than 2 midnights due to - Age 63 and older with one or more of the following:  - Obesity  - Expected need for hospital services (PT, OT, Nursing) required for safe  discharge  - Anticipated need for postoperative skilled nursing care or inpatient rehab  - Active co-morbidities: None OR   - Unanticipated findings during/Post Surgery: None  - Patient is a high risk of re-admission due to: None    DVT Prophylaxis - Aspirin Weight bearing as tolerated. D/C O2 and pulse ox and try on room air. Hemovac pulled without difficulty, will continue therapy today.  Plan is to go Home after hospital stay. Plan for discharge today once meeting goals. Scheduled for OPPT at [facility] in [place]. Follow-up in the office in 2 weeks.   Theresa Duty, PA-C Orthopedic Surgery 04/02/2019, 7:19 AM

## 2019-04-02 NOTE — Care Management CC44 (Signed)
Condition Code 44 Documentation Completed  Patient Details  [name] MRN: [id number] Date of Birth: 07-31-1940   Condition Code 44 given:  Yes Patient signature on Condition Code 44 notice:  Yes Documentation of 2 MD's agreement:  Yes Code 44 added to claim:  Yes    Lia Hopping, LCSW 04/02/2019, 9:47 AM

## 2019-04-02 NOTE — Progress Notes (Signed)
Physical Therapy Treatment Patient Details [name] MRN: [id number] DOB: Nov 17, 1940 Today's Date: 04/02/2019    History of Present Illness Patient is 78 y.o. s/p Rt TKA on 04/01/19 with PMH significant for GERD, HTN, OA, LT TKA in 2018.    PT Comments    Progressing with mobility. Will plan to have a 2nd session prior to d/c home later today. Wife found me in hallway to see if pt can be seen earlier rather than later. Will try to have 2nd session around 1:00 p.m    Follow Up Recommendations  Follow surgeon's recommendation for DC plan and follow-up therapies     Equipment Recommendations  Rolling walker with 5" wheels;3in1 (PT)    Recommendations for Other Services       Precautions / Restrictions Precautions Precautions: Fall Restrictions Weight Bearing Restrictions: No Other Position/Activity Restrictions: WBAT    Mobility  Bed Mobility Overal bed mobility: Needs Assistance Bed Mobility: Supine to Sit     Supine to sit: Min guard;HOB elevated     General bed mobility comments: for safety  Transfers Overall transfer level: Needs assistance Equipment used: Rolling walker (2 wheeled) Transfers: Sit to/from Stand Sit to Stand: Min guard         General transfer comment: for safety. VCs safety, hand placement  Ambulation/Gait Ambulation/Gait assistance: Min guard Gait Distance (Feet): 75 Feet Assistive device: Rolling walker (2 wheeled) Gait Pattern/deviations: Step-through pattern;Decreased step length - left;Decreased stride length;Decreased stance time - right     General Gait Details: close guard for safety. VCs safety, sequence, distance from RW. No knee buckling observed this session.   Stairs             Wheelchair Mobility    Modified Rankin (Stroke Patients Only)       Balance Overall balance assessment: Needs assistance         Standing balance support: Bilateral upper extremity supported Standing balance-Leahy  Scale: Poor                              Cognition Arousal/Alertness: Awake/alert Behavior During Therapy: WFL for tasks assessed/performed Overall Cognitive Status: Within Functional Limits for tasks assessed                                        Exercises Total Joint Exercises Ankle Circles/Pumps: AROM;Both;10 reps;Supine Quad Sets: AROM;Both;10 reps;Supine Heel Slides: AAROM;Right;10 reps;Supine Hip ABduction/ADduction: AAROM;Right;10 reps;Supine;AROM Straight Leg Raises: AAROM;AROM;Right;10 reps;Supine Goniometric ROM: ~10-70 degrees    General Comments        Pertinent Vitals/Pain Pain Assessment: 0-10 Pain Score: 7  Pain Location: R knee Pain Descriptors / Indicators: Discomfort;Sore;Aching Pain Intervention(s): Monitored during session;Repositioned;Ice applied    Home Living                      Prior Function            PT Goals (current goals can now be found in the care plan section) Progress towards PT goals: Progressing toward goals    Frequency    7X/week      PT Plan Current plan remains appropriate    Co-evaluation              AM-PAC PT "6 Clicks" Mobility   Outcome Measure  Help needed turning from your back to  your side while in a flat bed without using bedrails?: A Little Help needed moving from lying on your back to sitting on the side of a flat bed without using bedrails?: A Little Help needed moving to and from a bed to a chair (including a wheelchair)?: A Little Help needed standing up from a chair using your arms (e.g., wheelchair or bedside chair)?: A Little Help needed to walk in hospital room?: A Little Help needed climbing 3-5 steps with a railing? : A Little 6 Click Score: 18    End of Session Equipment Utilized During Treatment: Gait belt Activity Tolerance: Patient tolerated treatment well Patient left: in chair;with call bell/phone within reach   PT Visit Diagnosis: Muscle  weakness (generalized) (M62.81);Difficulty in walking, not elsewhere classified (R26.2)     Time: [id number] PT Time Calculation (min) (ACUTE ONLY): 19 min  Charges:  $Gait Training: 8-22 mins             Weston Anna, PT Acute Rehabilitation Services Pager: [phone] Office: [phone]

## 2019-04-02 NOTE — Care Management Obs Status (Signed)
[facility] NOTIFICATION   Patient Details  [name] MRN: [id number] Date of Birth: 03/06/1941   Medicare Observation Status Notification Given:  Yes    Lia Hopping, LCSW 04/02/2019, 9:47 AM

## 2019-04-03 NOTE — Discharge Summary (Signed)
Physician Discharge Summary   Patient ID: John Austin MRN: [id number] DOB/AGE: 1941-03-19 78 y.o.  Admit date: 04/01/2019 Discharge date: 04/02/2019  Primary Diagnosis: Osteoarthritis, right knee  Admission Diagnoses:  Past Medical History:  Diagnosis Date  . Arthritis   . Cancer (HCC)    hx of skin cancer   . Complication of anesthesia   . Essential hypertension   . GERD (gastroesophageal reflux disease)   . History of hiatal hernia   . PONV (postoperative nausea and vomiting)    per pt "slow to wake, difficult to put to sleep"   . Pre-diabetes    Discharge Diagnoses:   Active Problems:   OA (osteoarthritis) of knee  Estimated body mass index is 28.34 kg/m as calculated from the following:   Height as of this encounter: 5\' 11"  (1.803 m).   Weight as of this encounter: 92.2 kg.  Procedure:  Procedure(s) (LRB): TOTAL KNEE ARTHROPLASTY (Right)   Consults: None  HPI: John Austin is a 78 y.o. year old male with end stage OA of her right knee with progressively worsening pain and dysfunction. She has constant pain, with activity and at rest and significant functional deficits with difficulties even with ADLs. She has had extensive non-op management including analgesics, injections of cortisone and viscosupplements, and home exercise program, but remains in significant pain with significant dysfunction.Radiographs show bone on bone arthritis lateral and patellofemoral. She presents now for right Total Knee Arthroplasty.    Laboratory Data: Admission on 04/01/2019, Discharged on 04/02/2019  Component Date Value Ref Range Status  . WBC 04/02/2019 15.4* 4.0 - 10.5 K/uL Final  . RBC 04/02/2019 4.64  4.22 - 5.81 MIL/uL Final  . Hemoglobin 04/02/2019 12.5* 13.0 - 17.0 g/dL Final  . HCT 04/02/2019 39.0  39.0 - 52.0 % Final  . MCV 04/02/2019 84.1  80.0 - 100.0 fL Final  . MCH 04/02/2019 26.9  26.0 - 34.0 pg Final  . MCHC 04/02/2019 32.1  30.0 - 36.0 g/dL Final  . RDW  04/02/2019 14.5  11.5 - 15.5 % Final  . Platelets 04/02/2019 244  150 - 400 K/uL Final  . nRBC 04/02/2019 0.0  0.0 - 0.2 % Final   Performed at [facility], [place] [address], [place], [place] [address]  . Sodium 04/02/2019 138  135 - 145 mmol/L Final  . Potassium 04/02/2019 3.9  3.5 - 5.1 mmol/L Final  . Chloride 04/02/2019 106  98 - 111 mmol/L Final  . CO2 04/02/2019 24  22 - 32 mmol/L Final  . Glucose, Bld 04/02/2019 137* 70 - 99 mg/dL Final  . BUN 04/02/2019 10  8 - 23 mg/dL Final  . Creatinine, Ser 04/02/2019 0.59* 0.61 - 1.24 mg/dL Final  . Calcium 04/02/2019 8.5* 8.9 - 10.3 mg/dL Final  . GFR calc non Af Amer 04/02/2019 >60  >60 mL/min Final  . GFR calc Af Amer 04/02/2019 >60  >60 mL/min Final  . Anion gap 04/02/2019 8  5 - 15 Final   Performed at [facility], [place] [address], [place], [place] [address]  Hospital Outpatient Visit on 03/28/2019  Component Date Value Ref Range Status  . SARS-CoV-2, NAA 03/28/2019 NOT DETECTED  NOT DETECTED Final   Comment: (NOTE) This nucleic acid amplification test was developed and its performance characteristics determined by [employer]. Nucleic acid amplification tests include PCR and TMA. This test has not been FDA cleared or approved. This test has been authorized by FDA under an Emergency  Use Authorization (EUA). This test is only authorized for the duration of time the declaration that circumstances exist justifying the authorization of the emergency use of in vitro diagnostic tests for detection of SARS-CoV-2 virus and/or diagnosis of COVID-19 infection under section 564(b)(1) of the Act, 21 U.S.C. [id number]) (1), unless the authorization is terminated or revoked sooner. When diagnostic testing is negative, the possibility of a false negative result should be considered in the context of a patient's recent exposures and the presence of clinical signs and symptoms consistent with COVID-19.  An individual without symptoms of COVID- 19 and who is not shedding SARS-CoV-2 vi                          rus would expect to have a negative (not detected) result in this assay. Performed At: [facility] [place], [place] [id number] Rush Farmer MD [id number]   . Coronavirus Source 03/28/2019 NASOPHARYNGEAL   Final   Performed at [facility], [place] [address], [place], [place] [address]  Hospital Outpatient Visit on 03/28/2019  Component Date Value Ref Range Status  . aPTT 03/28/2019 31  24 - 36 seconds Final   Performed at [facility], [place] [address] [place], [place] [address]  . WBC 03/28/2019 8.6  4.0 - 10.5 K/uL Final  . RBC 03/28/2019 5.33  4.22 - 5.81 MIL/uL Final  . Hemoglobin 03/28/2019 14.5  13.0 - 17.0 g/dL Final  . HCT 03/28/2019 44.5  39.0 - 52.0 % Final  . MCV 03/28/2019 83.5  80.0 - 100.0 fL Final  . MCH 03/28/2019 27.2  26.0 - 34.0 pg Final  . MCHC 03/28/2019 32.6  30.0 - 36.0 g/dL Final  . RDW 03/28/2019 14.4  11.5 - 15.5 % Final  . Platelets 03/28/2019 299  150 - 400 K/uL Final  . nRBC 03/28/2019 0.0  0.0 - 0.2 % Final   Performed at [facility], [place] [address], [place],  [address]  . Sodium 03/28/2019 143  135 - 145 mmol/L Final  . Potassium 03/28/2019 3.8  3.5 - 5.1 mmol/L Final  . Chloride 03/28/2019 101  98 - 111 mmol/L Final  . CO2 03/28/2019 31  22 - 32 mmol/L Final  . Glucose, Bld 03/28/2019 114* 70 - 99 mg/dL Final  . BUN 03/28/2019 9  8 - 23 mg/dL Final  . Creatinine, Ser 03/28/2019 0.72  0.61 - 1.24 mg/dL Final  . Calcium 03/28/2019 9.0  8.9 - 10.3 mg/dL Final  . Total Protein 03/28/2019 7.1  6.5 - 8.1 g/dL Final  . Albumin 03/28/2019 4.1  3.5 - 5.0 g/dL Final  . AST 03/28/2019 20  15 - 41 U/L Final  . ALT 03/28/2019 18  0 - 44 U/L Final  . Alkaline Phosphatase 03/28/2019 59  38 - 126 U/L Final  . Total Bilirubin 03/28/2019 0.8  0.3 - 1.2 mg/dL Final  . GFR calc non Af  Amer 03/28/2019 >60  >60 mL/min Final  . GFR calc Af Amer 03/28/2019 >60  >60 mL/min Final  . Anion gap 03/28/2019 11  5 - 15 Final   Performed at [facility], [place] [address] [place],  [address]  . Prothrombin Time 03/28/2019 13.8  11.4 - 15.2 seconds Final  . INR 03/28/2019 1.1  0.8 - 1.2 Final   Comment: (NOTE) INR goal varies based on device and disease states. Performed at [facility],  [place] [address], [place], [place] [address]   . ABO/RH(D) 03/28/2019 A POS   Final  . Antibody Screen 03/28/2019 NEG   Final  . Sample Expiration 03/28/2019 04/04/2019,2359   Final  . Extend sample reason 03/28/2019    Final                   Value:NO TRANSFUSIONS OR PREGNANCY IN THE PAST 3 MONTHS Performed at [facility], [place] [address], [place], [place] [address]   . MRSA, PCR 03/28/2019 NEGATIVE  NEGATIVE Final  . Staphylococcus aureus 03/28/2019 NEGATIVE  NEGATIVE Final   Comment: (NOTE) The Xpert SA Assay (FDA approved for NASAL specimens in patients 60 years of age and older), is one component of a comprehensive surveillance program. It is not intended to diagnose infection nor to guide or monitor treatment. Performed at [facility], [place] [address] [place], [place] [address]   . Hgb A1c MFr Bld 03/28/2019 5.8* 4.8 - 5.6 % Final   Comment: (NOTE) Pre diabetes:          5.7%-6.4% Diabetes:              >6.4% Glycemic control for   <7.0% adults with diabetes   . Mean Plasma Glucose 03/28/2019 119.76  mg/dL Final   Performed at [place] [address] [place], [place] [address]     X-Rays:No results found.  EKG: Orders placed or performed during the hospital encounter of 03/28/19  . EKG 12-Lead  . EKG 12-Lead  . EKG 12-Lead  . EKG 12-Lead     Hospital Course: John Austin is a 78 y.o. who was admitted to [facility]. They were brought to the operating room on 04/01/2019  and underwent Procedure(s): TOTAL KNEE ARTHROPLASTY.  Patient tolerated the procedure well and was later transferred to the recovery room and then to the orthopaedic floor for postoperative care. They were given PO and IV analgesics for pain control following their surgery. They were given 24 hours of postoperative antibiotics of  Anti-infectives (From admission, onward)   Start     Dose/Rate Route Frequency Ordered Stop   04/01/19 2200  doxycycline (VIBRA-TABS) tablet 50 mg  Status:  Discontinued     50 mg Oral 2 times daily 04/01/19 1045 04/02/19 1756   04/01/19 1300  ceFAZolin (ANCEF) IVPB 2g/100 mL premix     2 g 200 mL/hr over 30 Minutes Intravenous Every 6 hours 04/01/19 1045 04/01/19 1921   04/01/19 0600  ceFAZolin (ANCEF) IVPB 2g/100 mL premix     2 g 200 mL/hr over 30 Minutes Intravenous On call to O.R. 04/01/19 0531 04/01/19 0713   04/01/19 0535  ceFAZolin (ANCEF) 2-4 GM/100ML-% IVPB    Note to Pharmacy: Randa Evens  : cabinet override      04/01/19 0535 04/01/19 0713     and started on DVT prophylaxis in the form of Aspirin.   PT and OT were ordered for total joint protocol. Discharge planning consulted to help with postop disposition and equipment needs.  Patient had a good night on the evening of surgery. They started to get up OOB with therapy on POD #0. Pt was seen during rounds and was ready to go home pending progress with therapy. Hemovac drain was pulled without difficulty. He worked with therapy on POD #1 and was meeting his goals. Pt was discharged to home later that day in stable condition.  Diet: Regular diet Activity: WBAT  Follow-up: in 2 weeks Disposition: Home Discharged Condition: good   Discharge Instructions    Call MD / Call 911   Complete by: As directed    If you experience chest pain or shortness of breath, CALL 911 and be transported to the hospital emergency room.  If you develope a fever above 101 F, pus (white drainage) or increased drainage or  redness at the wound, or calf pain, call your surgeon's office.   Change dressing   Complete by: As directed    Change dressing on Wednesday, then change the dressing daily with sterile 4 x 4 inch gauze dressing and apply TED hose.   Constipation Prevention   Complete by: As directed    Drink plenty of fluids.  Prune juice may be helpful.  You may use a stool softener, such as Colace (over the counter) 100 mg twice a day.  Use MiraLax (over the counter) for constipation as needed.   Diet - low sodium heart healthy   Complete by: As directed    Discharge instructions   Complete by: As directed    Dr. Gaynelle Arabian Total Joint Specialist [facility] [address], [place], [place] [address] [phone]  TOTAL KNEE REPLACEMENT POSTOPERATIVE DIRECTIONS  Knee Rehabilitation, Guidelines Following Surgery  Results after knee surgery are often greatly improved when you follow the exercise, range of motion and muscle strengthening exercises prescribed by your doctor. Safety measures are also important to protect the knee from further injury. Any time any of these exercises cause you to have increased pain or swelling in your knee joint, decrease the amount until you are comfortable again and slowly increase them. If you have problems or questions, call your caregiver or physical therapist for advice.   HOME CARE INSTRUCTIONS  Remove items at home which could result in a fall. This includes throw rugs or furniture in walking pathways.  ICE to the affected knee every three hours for 30 minutes at a time and then as needed for pain and swelling.  Continue to use ice on the knee for pain and swelling from surgery. You may notice swelling that will progress down to the foot and ankle.  This is normal after surgery.  Elevate the leg when you are not up walking on it.   Continue to use the breathing machine which will help keep your temperature down.  It is common for your temperature to  cycle up and down following surgery, especially at night when you are not up moving around and exerting yourself.  The breathing machine keeps your lungs expanded and your temperature down. Do not place pillow under knee, focus on keeping the knee straight while resting   DIET You may resume your previous home diet once your are discharged from the hospital.  DRESSING / WOUND CARE / SHOWERING You may change your dressing 3-5 days after surgery.  Then change the dressing every day with sterile gauze.  Please use good hand washing techniques before changing the dressing.  Do not use any lotions or creams on the incision until instructed by your surgeon. You may start showering once you are discharged home but do not submerge the incision under water. Just pat the incision dry and apply a dry gauze dressing on daily. Change the surgical dressing daily and reapply a dry dressing each time.  ACTIVITY Walk with your walker as instructed. Use walker as long as suggested by your caregivers. Avoid periods of inactivity such as sitting longer  than an hour when not asleep. This helps prevent blood clots.  You may resume a sexual relationship in one month or when given the OK by your doctor.  You may return to work once you are cleared by your doctor.  Do not drive a car for 6 weeks or until released by you surgeon.  Do not drive while taking narcotics.  WEIGHT BEARING Weight bearing as tolerated with assist device (walker, cane, etc) as directed, use it as long as suggested by your surgeon or therapist, typically at least 4-6 weeks.  POSTOPERATIVE CONSTIPATION PROTOCOL Constipation - defined medically as fewer than three stools per week and severe constipation as less than one stool per week.  One of the most common issues patients have following surgery is constipation.  Even if you have a regular bowel pattern at home, your normal regimen is likely to be disrupted due to multiple reasons following  surgery.  Combination of anesthesia, postoperative narcotics, change in appetite and fluid intake all can affect your bowels.  In order to avoid complications following surgery, here are some recommendations in order to help you during your recovery period.  Colace (docusate) - Pick up an over-the-counter form of Colace or another stool softener and take twice a day as long as you are requiring postoperative pain medications.  Take with a full glass of water daily.  If you experience loose stools or diarrhea, hold the colace until you stool forms back up.  If your symptoms do not get better within 1 week or if they get worse, check with your doctor.  Dulcolax (bisacodyl) - Pick up over-the-counter and take as directed by the product packaging as needed to assist with the movement of your bowels.  Take with a full glass of water.  Use this product as needed if not relieved by Colace only.   MiraLax (polyethylene glycol) - Pick up over-the-counter to have on hand.  MiraLax is a solution that will increase the amount of water in your bowels to assist with bowel movements.  Take as directed and can mix with a glass of water, juice, soda, coffee, or tea.  Take if you go more than two days without a movement. Do not use MiraLax more than once per day. Call your doctor if you are still constipated or irregular after using this medication for 7 days in a row.  If you continue to have problems with postoperative constipation, please contact the office for further assistance and recommendations.  If you experience "the worst abdominal pain ever" or develop nausea or vomiting, please contact the office immediatly for further recommendations for treatment.  ITCHING  If you experience itching with your medications, try taking only a single pain pill, or even half a pain pill at a time.  You can also use Benadryl over the counter for itching or also to help with sleep.   TED HOSE STOCKINGS Wear the elastic  stockings on both legs for three weeks following surgery during the day but you may remove then at night for sleeping.  MEDICATIONS See your medication summary on the "After Visit Summary" that the nursing staff will review with you prior to discharge.  You may have some home medications which will be placed on hold until you complete the course of blood thinner medication.  It is important for you to complete the blood thinner medication as prescribed by your surgeon.  Continue your approved medications as instructed at time of discharge.  PRECAUTIONS If you  experience chest pain or shortness of breath - call 911 immediately for transfer to the hospital emergency department.  If you develop a fever greater that 101 F, purulent drainage from wound, increased redness or drainage from wound, foul odor from the wound/dressing, or calf pain - CONTACT YOUR SURGEON.                                                   FOLLOW-UP APPOINTMENTS Make sure you keep all of your appointments after your operation with your surgeon and caregivers. You should call the office at the above phone number and make an appointment for approximately two weeks after the date of your surgery or on the date instructed by your surgeon outlined in the "After Visit Summary".   RANGE OF MOTION AND STRENGTHENING EXERCISES  Rehabilitation of the knee is important following a knee injury or an operation. After just a few days of immobilization, the muscles of the thigh which control the knee become weakened and shrink (atrophy). Knee exercises are designed to build up the tone and strength of the thigh muscles and to improve knee motion. Often times heat used for twenty to thirty minutes before working out will loosen up your tissues and help with improving the range of motion but do not use heat for the first two weeks following surgery. These exercises can be done on a training (exercise) mat, on the floor, on a table or on a bed. Use  what ever works the best and is most comfortable for you Knee exercises include:  Leg Lifts - While your knee is still immobilized in a splint or cast, you can do straight leg raises. Lift the leg to 60 degrees, hold for 3 sec, and slowly lower the leg. Repeat 10-20 times 2-3 times daily. Perform this exercise against resistance later as your knee gets better.  Quad and Hamstring Sets - Tighten up the muscle on the front of the thigh (Quad) and hold for 5-10 sec. Repeat this 10-20 times hourly. Hamstring sets are done by pushing the foot backward against an object and holding for 5-10 sec. Repeat as with quad sets.  Leg Slides: Lying on your back, slowly slide your foot toward your buttocks, bending your knee up off the floor (only go as far as is comfortable). Then slowly slide your foot back down until your leg is flat on the floor again. Angel Wings: Lying on your back spread your legs to the side as far apart as you can without causing discomfort.  A rehabilitation program following serious knee injuries can speed recovery and prevent re-injury in the future due to weakened muscles. Contact your doctor or a physical therapist for more information on knee rehabilitation.   IF YOU ARE TRANSFERRED TO A SKILLED REHAB FACILITY If the patient is transferred to a skilled rehab facility following release from the hospital, a list of the current medications will be sent to the facility for the patient to continue.  When discharged from the skilled rehab facility, please have the facility set up the patient's [place] prior to being released. Also, the skilled facility will be responsible for providing the patient with their medications at time of release from the facility to include their pain medication, the muscle relaxants, and their blood thinner medication. If the patient is still at  the rehab facility at time of the two week follow up appointment, the skilled rehab facility will also  need to assist the patient in arranging follow up appointment in our office and any transportation needs.  MAKE SURE YOU:  Understand these instructions.  Get help right away if you are not doing well or get worse.    Pick up stool softner and laxative for home use following surgery while on pain medications. Do not submerge incision under water. Please use good hand washing techniques while changing dressing each day. May shower starting three days after surgery. Please use a clean towel to pat the incision dry following showers. Continue to use ice for pain and swelling after surgery. Do not use any lotions or creams on the incision until instructed by your surgeon.   Do not put a pillow under the knee. Place it under the heel.   Complete by: As directed    Driving restrictions   Complete by: As directed    No driving for two weeks   TED hose   Complete by: As directed    Use stockings (TED hose) for three weeks on both leg(s).  You may remove them at night for sleeping.   Weight bearing as tolerated   Complete by: As directed      Allergies as of 04/02/2019   No Known Allergies     Medication List    TAKE these medications   aspirin 325 MG EC tablet Take 1 tablet (325 mg total) by mouth 2 (two) times daily for 20 days. Then resume one 81 mg aspirin once a day. What changed:   medication strength  how much to take  when to take this  additional instructions   azelastine 0.05 % ophthalmic solution Commonly known as: OPTIVAR Place 1 drop into both eyes 2 (two) times daily.   Cinnamon 500 MG Tabs Take 500 mg by mouth daily.   clobetasol 0.05 % external solution Commonly known as: TEMOVATE Apply 1 application topically daily.   diltiazem 180 MG 24 hr capsule Commonly known as: TIAZAC Take 180 mg by mouth every evening.   docusate sodium 100 MG capsule Commonly known as: COLACE Take 100-200 mg by mouth daily as needed (constipation.).   doxycycline 50 MG  capsule Commonly known as: VIBRAMYCIN Take 50 mg by mouth 2 (two) times daily.   fexofenadine 180 MG tablet Commonly known as: ALLEGRA Take 180 mg by mouth daily as needed for allergies.   FISH OIL PO Take 1 capsule by mouth daily.   fluticasone 50 MCG/ACT nasal spray Commonly known as: FLONASE Place 1-2 sprays into both nostrils at bedtime. Allergies.   gabapentin 100 MG capsule Commonly known as: NEURONTIN Take 2 capsules (200 mg total) by mouth 3 (three) times daily. Take 200 mg three times a day for two weeks following surgery.Then take 200 mg two times a day for two weeks. Then take 200 mg once a day for two weeks. Then discontinue.   ketoconazole 2 % shampoo Commonly known as: NIZORAL Apply 1 application topically 3 (three) times a week.   meclizine 25 MG tablet Commonly known as: ANTIVERT Take 12.5-25 mg by mouth daily as needed (vertigo). dizziness   methocarbamol 500 MG tablet Commonly known as: ROBAXIN Take 1 tablet (500 mg total) by mouth every 6 (six) hours as needed for muscle spasms.   metroNIDAZOLE 0.75 % cream Commonly known as: METROCREAM Apply 1 application topically daily.   multivitamin with minerals Tabs tablet  Take 1 tablet by mouth daily.   oxyCODONE 5 MG immediate release tablet Commonly known as: Oxy IR/ROXICODONE Take 1-2 tablets (5-10 mg total) by mouth every 6 (six) hours as needed for severe pain.   traMADol 50 MG tablet Commonly known as: ULTRAM Take 1-2 tablets (50-100 mg total) by mouth every 6 (six) hours as needed for moderate pain.   triamcinolone cream 0.1 % Commonly known as: KENALOG Apply 1 application topically daily as needed (rosacea).            Discharge Care Instructions  (From admission, onward)         Start     Ordered   04/02/19 0000  Weight bearing as tolerated     04/02/19 0723   04/02/19 0000  Change dressing    Comments: Change dressing on Wednesday, then change the dressing daily with sterile 4 x 4  inch gauze dressing and apply TED hose.   04/02/19 [id number]         Follow-up Information    Gaynelle Arabian, MD. Go on 04/16/2019.   Specialty: Orthopedic Surgery Why: You are scheduled for a post-operative appointment on 04-16-19 at 2:00 pm.  Contact information: [address] [place] [address] [phone]        Fergus Madison. Go on 04/08/2019.   Why: You are scheduled for a physical therapy appointment on 04-08-19 at 1:00 pm.           Signed: Griffith Citron, PA-C Orthopedic Surgery 04/03/2019, 8:09 AM

## 2019-04-08 ENCOUNTER — Ambulatory Visit: Payer: Medicare Other | Attending: Orthopedic Surgery | Admitting: Physical Therapy

## 2019-04-08 ENCOUNTER — Encounter: Payer: Self-pay | Admitting: Physical Therapy

## 2019-04-08 ENCOUNTER — Other Ambulatory Visit: Payer: Self-pay

## 2019-04-08 DIAGNOSIS — M25661 Stiffness of right knee, not elsewhere classified: Secondary | ICD-10-CM | POA: Insufficient documentation

## 2019-04-08 DIAGNOSIS — M6281 Muscle weakness (generalized): Secondary | ICD-10-CM

## 2019-04-08 DIAGNOSIS — R600 Localized edema: Secondary | ICD-10-CM | POA: Insufficient documentation

## 2019-04-08 DIAGNOSIS — M25561 Pain in right knee: Secondary | ICD-10-CM | POA: Insufficient documentation

## 2019-04-08 DIAGNOSIS — G8929 Other chronic pain: Secondary | ICD-10-CM | POA: Diagnosis not present

## 2019-04-08 NOTE — Therapy (Signed)
[facility] [place], [place], [address] Phone: [phone]   Fax:  [phone]  Physical Therapy Evaluation  Patient Details  [name] MRN: [id number] Date of Birth: [birth year] Referring Provider (PT): Gaynelle Arabian MD.   Encounter Date: 04/08/2019  PT End of Session - 04/08/19 1348    Visit Number  1    Number of Visits  16    Date for PT Re-Evaluation  07/07/19    Authorization Type  FOTO AT LEAST EVERY 5TH VISIT.  PROGRESS NOTE AT 10TH VISIT.  KX MODIFIER AFTER 15 VISITS.    PT Start Time  0100    PT Stop Time  0144    PT Time Calculation (min)  44 min    Activity Tolerance  Patient tolerated treatment well    Behavior During Therapy  WFL for tasks assessed/performed       Past Medical History:  Diagnosis Date  . Arthritis   . Cancer (HCC)    hx of skin cancer   . Complication of anesthesia   . Essential hypertension   . GERD (gastroesophageal reflux disease)   . History of hiatal hernia   . PONV (postoperative nausea and vomiting)    per pt "slow to wake, difficult to put to sleep"   . Pre-diabetes     Past Surgical History:  Procedure Laterality Date  . BILATERAL SHOULDER SURGERY    . COLONOSCOPY    . EYE SURGERY     cataract extractions   . eyebrow surgery     . RIGHT FOOT SURGERY    . 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 TOTAL KNEE ARTHROPLASTY Right 04/01/2019   Procedure: TOTAL KNEE ARTHROPLASTY;  Surgeon: Gaynelle Arabian, MD;  Location: [facility];  Service: Orthopedics;  Laterality: Right;  26min    There were no vitals filed for this visit.   Subjective Assessment - 04/08/19 1338    Subjective  COVID-19 screen performed prior to patient entering clinic.  The patient presents to the clinic today s/p right total knee  replacement performed on 04/01/19.  His resting pain-level is a 5/10 today and higher with movement.  Medication decreases his pain.  He is walking safely with a FWW.    Pertinent History  Left TKA, bilateral shoulder surgeries, right foot surgery, GERD, HTN.    How long can you walk comfortably?  Around house with [place].    Patient Stated Goals  Get back to normal.    Currently in Pain?  Yes    Pain Score  5     Pain Location  Knee    Pain Orientation  Right    Pain Descriptors / Indicators  Aching    Pain Type  Surgical pain    Pain Onset  In the past 7 days    Pain Frequency  Constant    Aggravating Factors   Bending knee.    Pain Relieving Factors  Rest.         [facility] PT Assessment - 04/08/19 0001      Assessment   Medical Diagnosis  Right total knee replacement.    Referring Provider (PT)  Gaynelle Arabian MD.    Onset Date/Surgical Date  --   04/01/19(surgery date).     Precautions   Precautions  --   No  ultrasound.     Restrictions   Weight Bearing Restrictions  No      Balance Screen   Has the patient fallen in the past 6 months  No    Has the patient had a decrease in activity level because of a fear of falling?   No    Is the patient reluctant to leave their home because of a fear of falling?   No      Home Environment   Living Environment  Private residence      Prior Function   Level of Independence  Independent      Observation/Other Assessments   Observations  Post-surgical dressing intact.  Patient compliant to wearing TED hose.    Focus on Therapeutic Outcomes (FOTO)   68% limitation.      Observation/Other Assessments-Edema    Edema  Circumferential      Circumferential Edema   Circumferential - Right  RT 6 cms > LT.      ROM / Strength   AROM / PROM / Strength  AROM;Strength      AROM   Overall AROM Comments  -20 degress of right knee extension and flexion to 102 degrees.      Strength   Overall Strength Comments  Right hip= 4/5 and right knee  extension= 4/5.      Palpation   Palpation comment  Mild diffuse right anterior knee pain.      Ambulation/Gait   Gait Comments  Slow and cautious with a FWW.                Objective measurements completed on examination: See above findings.      [facility] Adult PT Treatment/Exercise - 04/08/19 0001      Modalities   Modalities  Vasopneumatic      Vasopneumatic   Number Minutes Vasopneumatic   10 minutes    Vasopnuematic Location   --   Right knee.   Vasopneumatic Pressure  Low               PT Short Term Goals - 04/08/19 1428      PT SHORT TERM GOAL #1   Title  Full active right knee extension in order to normalize gait.        PT Long Term Goals - 04/08/19 1429      PT LONG TERM GOAL #1   Title  Independent with a HEP.    Time  8    Period  Weeks    Status  Achieved      PT LONG TERM GOAL #2   Title  Active left knee flexion to 120-125 degrees+ so the patient can perform functional tasks and do so with pain not > 2-3/10.    Time  8    Period  Weeks    Status  New      PT LONG TERM GOAL #3   Title  Increase right knee strength to a solid 5/5 to provide good stability for accomplishment of functional activities    Time  8    Period  Weeks    Status  New      PT LONG TERM GOAL #4   Title  Perform a reciprocating stair gait with one railing with pain not > 2-3/10.    Time  8    Period  Weeks    Status  New      PT LONG TERM GOAL #5   Title  Perform ADL's with  pain not > 3/10.    Time  8    Period  Weeks    Status  New             Plan - 04/08/19 1419    Clinical Impression Statement  The patient present to OPPT s/p right total knee replacement performed on 04/01/19.  He currently lacks 20 degrees of extension and his active flexion is measured to 102 degrees today.  He has a significant amount of edema thoug he is compliant to using his TED hose.  He is currrently walking safely with a FWW.    Personal Factors and Comorbidities   Age;Comorbidity 1;Comorbidity 2    Comorbidities  Left TKA, bilateral shoulder surgeries, right foot surgery, GERD, HTN.    Examination-Activity Limitations  Locomotion Level    Stability/Clinical Decision Making  Stable/Uncomplicated    Clinical Decision Making  Low    Rehab Potential  Excellent    PT Frequency  --   12-16 visits.   PT Treatment/Interventions  ADLs/Self Care Home Management;Cryotherapy;Electrical Stimulation;Gait training;Moist Heat;Stair training;Functional mobility training;Therapeutic activities;Therapeutic exercise;Neuromuscular re-education;Manual techniques;Patient/family education;Passive range of motion;Vasopneumatic Device    PT Next Visit Plan  Nustep.  Patient should be able to progress quickly to bike.  Please work hard on extension.  Vasopneumatic.    Consulted and Agree with Plan of Care  Patient       Patient will benefit from skilled therapeutic intervention in order to improve the following deficits and impairments:  Pain, Decreased activity tolerance, Decreased range of motion, Decreased strength, Increased edema  Visit Diagnosis: Chronic pain of right knee - Plan: PT plan of care cert/re-cert  Stiffness of right knee, not elsewhere classified - Plan: PT plan of care cert/re-cert  Localized edema - Plan: PT plan of care cert/re-cert  Muscle weakness (generalized) - Plan: PT plan of care cert/re-cert     Problem List Patient Active Problem List   Diagnosis Date Noted  . OA (osteoarthritis) of knee 08/08/2016  . Leukocytosis 04/29/2015  . Abdominal pain, left lower quadrant 04/29/2015  . Acute diverticulitis 04/27/2015  . Essential hypertension 04/27/2015    Leidi Astle, [place] MPT 04/08/2019, 2:33 PM  [facility] [address] [place], [place], [address] Phone: [phone]   Fax:  [phone]  [name] MRN: [id number] Date of Birth: 14-Apr-1941

## 2019-04-10 ENCOUNTER — Ambulatory Visit: Payer: Medicare Other | Attending: Orthopedic Surgery | Admitting: Physical Therapy

## 2019-04-10 ENCOUNTER — Other Ambulatory Visit: Payer: Self-pay

## 2019-04-10 DIAGNOSIS — M25661 Stiffness of right knee, not elsewhere classified: Secondary | ICD-10-CM | POA: Diagnosis present

## 2019-04-10 DIAGNOSIS — M6281 Muscle weakness (generalized): Secondary | ICD-10-CM | POA: Diagnosis present

## 2019-04-10 DIAGNOSIS — R600 Localized edema: Secondary | ICD-10-CM | POA: Diagnosis present

## 2019-04-10 DIAGNOSIS — G8929 Other chronic pain: Secondary | ICD-10-CM | POA: Diagnosis present

## 2019-04-10 DIAGNOSIS — M25561 Pain in right knee: Secondary | ICD-10-CM | POA: Diagnosis not present

## 2019-04-10 NOTE — Therapy (Signed)
[facility] [place], [place], [address] Phone: [phone]   Fax:  [phone]  Physical Therapy Treatment  Patient Details  [name] MRN: [id number] Date of Birth: 01-Nov-1940 Referring Provider (PT): Gaynelle Arabian MD.   Encounter Date: 04/10/2019  PT End of Session - 04/10/19 1402    Visit Number  2    Number of Visits  16    Date for PT Re-Evaluation  07/07/19    Authorization Type  FOTO AT LEAST EVERY 5TH VISIT.  PROGRESS NOTE AT 10TH VISIT.  KX MODIFIER AFTER 15 VISITS.    PT Start Time  0100    PT Stop Time  0201    PT Time Calculation (min)  61 min    Activity Tolerance  Patient tolerated treatment well    Behavior During Therapy  WFL for tasks assessed/performed       Past Medical History:  Diagnosis Date  . Arthritis   . Cancer (HCC)    hx of skin cancer   . Complication of anesthesia   . Essential hypertension   . GERD (gastroesophageal reflux disease)   . History of hiatal hernia   . PONV (postoperative nausea and vomiting)    per pt "slow to wake, difficult to put to sleep"   . Pre-diabetes     Past Surgical History:  Procedure Laterality Date  . BILATERAL SHOULDER SURGERY    . COLONOSCOPY    . EYE SURGERY     cataract extractions   . eyebrow surgery     . RIGHT FOOT SURGERY    . 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 TOTAL KNEE ARTHROPLASTY Right 04/01/2019   Procedure: TOTAL KNEE ARTHROPLASTY;  Surgeon: Gaynelle Arabian, MD;  Location: [facility];  Service: Orthopedics;  Laterality: Right;  13min    There were no vitals filed for this visit.  Subjective Assessment - 04/10/19 1330    Subjective  COVID-19 screen performed prior to patient entering clinic.  No new complaints.    Pertinent History  Left TKA, bilateral  shoulder surgeries, right foot surgery, GERD, HTN.    How long can you walk comfortably?  Around house with [place].    Patient Stated Goals  Get back to normal.    Currently in Pain?  Yes    Pain Score  5     Pain Location  Knee    Pain Orientation  Right    Pain Descriptors / Indicators  Aching    Pain Type  Surgical pain    Pain Onset  1 to 4 weeks ago    Pain Frequency  Constant                       [facility] Adult PT Treatment/Exercise - 04/10/19 0001      Exercises   Exercises  Knee/Hip      Knee/Hip Exercises: Aerobic   Nustep  Level 2 x 15 minutes moving seat forward x 3 to increase knee flexion.      Knee/Hip Exercises: Supine   Short Arc Quad Sets Limitations  16 minutes facilitated with VMS to patient's right quadriceps with 10 sec extension and 10 sec rest.      Modalities   Modalities  Vasopneumatic      Vasopneumatic   Number  Minutes Vasopneumatic   15 minutes    Vasopnuematic Location   --   Right knee.   Vasopneumatic Pressure  Low      Manual Therapy   Manual Therapy  --               PT Short Term Goals - 04/08/19 1428      PT SHORT TERM GOAL #1   Title  Full active right knee extension in order to normalize gait.        PT Long Term Goals - 04/08/19 1429      PT LONG TERM GOAL #1   Title  Independent with a HEP.    Time  8    Period  Weeks    Status  Achieved      PT LONG TERM GOAL #2   Title  Active left knee flexion to 120-125 degrees+ so the patient can perform functional tasks and do so with pain not > 2-3/10.    Time  8    Period  Weeks    Status  New      PT LONG TERM GOAL #3   Title  Increase right knee strength to a solid 5/5 to provide good stability for accomplishment of functional activities    Time  8    Period  Weeks    Status  New      PT LONG TERM GOAL #4   Title  Perform a reciprocating stair gait with one railing with pain not > 2-3/10.    Time  8    Period  Weeks    Status  New      PT LONG  TERM GOAL #5   Title  Perform ADL's with pain not > 3/10.    Time  8    Period  Weeks    Status  New            Plan - 04/10/19 1353    Clinical Impression Statement  Patient did great today on Nustep and had good quadriceps activation with VMS today.    Personal Factors and Comorbidities  Age;Comorbidity 1;Comorbidity 2    Comorbidities  Left TKA, bilateral shoulder surgeries, right foot surgery, GERD, HTN.    Examination-Activity Limitations  Locomotion Level    Stability/Clinical Decision Making  Stable/Uncomplicated    Rehab Potential  Excellent    PT Treatment/Interventions  ADLs/Self Care Home Management;Cryotherapy;Electrical Stimulation;Gait training;Moist Heat;Stair training;Functional mobility training;Therapeutic activities;Therapeutic exercise;Neuromuscular re-education;Manual techniques;Patient/family education;Passive range of motion;Vasopneumatic Device    PT Next Visit Plan  Nustep.  Patient should be able to progress quickly to bike.  Please work hard on extension.  Vasopneumatic.    Consulted and Agree with Plan of Care  Patient       Patient will benefit from skilled therapeutic intervention in order to improve the following deficits and impairments:  Pain, Decreased activity tolerance, Decreased range of motion, Decreased strength, Increased edema  Visit Diagnosis: Chronic pain of right knee  Stiffness of right knee, not elsewhere classified  Localized edema  Muscle weakness (generalized)     Problem List Patient Active Problem List   Diagnosis Date Noted  . OA (osteoarthritis) of knee 08/08/2016  . Leukocytosis 04/29/2015  . Abdominal pain, left lower quadrant 04/29/2015  . Acute diverticulitis 04/27/2015  . Essential hypertension 04/27/2015    Gabe Glace, [place] MPT 04/10/2019, 2:04 PM  [facility] [address] [place], [place], [address] Phone: [phone]   Fax:  [phone]  [name] MRN: [id number] Date of Birth: 1941-05-09

## 2019-04-11 ENCOUNTER — Encounter: Payer: Medicare Other | Admitting: Physical Therapy

## 2019-04-16 ENCOUNTER — Other Ambulatory Visit: Payer: Self-pay

## 2019-04-16 ENCOUNTER — Ambulatory Visit: Payer: Medicare Other | Admitting: Physical Therapy

## 2019-04-16 DIAGNOSIS — M25561 Pain in right knee: Secondary | ICD-10-CM

## 2019-04-16 DIAGNOSIS — Z96651 Presence of right artificial knee joint: Secondary | ICD-10-CM | POA: Diagnosis not present

## 2019-04-16 DIAGNOSIS — Z471 Aftercare following joint replacement surgery: Secondary | ICD-10-CM | POA: Diagnosis not present

## 2019-04-16 DIAGNOSIS — R600 Localized edema: Secondary | ICD-10-CM

## 2019-04-16 DIAGNOSIS — M25661 Stiffness of right knee, not elsewhere classified: Secondary | ICD-10-CM

## 2019-04-16 DIAGNOSIS — M6281 Muscle weakness (generalized): Secondary | ICD-10-CM

## 2019-04-16 NOTE — Therapy (Signed)
[facility] [place], [place], [address] Phone: [phone]   Fax:  [phone]  Physical Therapy Treatment  Patient Details  [name] MRN: [id number] Date of Birth: Jun 02, 1940 Referring Provider (PT): Gaynelle Arabian MD.   Encounter Date: 04/16/2019  PT End of Session - 04/16/19 1224    Visit Number  3    Number of Visits  16    Date for PT Re-Evaluation  07/07/19    Authorization Type  FOTO AT LEAST EVERY 5TH VISIT.  PROGRESS NOTE AT 10TH VISIT.  KX MODIFIER AFTER 15 VISITS.    PT Start Time  [phone]    PT Stop Time  1045    PT Time Calculation (min)  54 min    Activity Tolerance  Patient tolerated treatment well    Behavior During Therapy  WFL for tasks assessed/performed       Past Medical History:  Diagnosis Date  . Arthritis   . Cancer (HCC)    hx of skin cancer   . Complication of anesthesia   . Essential hypertension   . GERD (gastroesophageal reflux disease)   . History of hiatal hernia   . PONV (postoperative nausea and vomiting)    per pt "slow to wake, difficult to put to sleep"   . Pre-diabetes     Past Surgical History:  Procedure Laterality Date  . BILATERAL SHOULDER SURGERY    . COLONOSCOPY    . EYE SURGERY     cataract extractions   . eyebrow surgery     . RIGHT FOOT SURGERY    . 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 TOTAL KNEE ARTHROPLASTY Right 04/01/2019   Procedure: TOTAL KNEE ARTHROPLASTY;  Surgeon: Gaynelle Arabian, MD;  Location: [facility];  Service: Orthopedics;  Laterality: Right;  37min    There were no vitals filed for this visit.  Subjective Assessment - 04/16/19 1220    Subjective  COVID-19 screen performed prior to patient entering clinic.  Doing good.  My daughter (a Marine scientist) changed my dressing.    Pertinent History  Left TKA, bilateral shoulder surgeries, right foot surgery, GERD, HTN.    How long can you walk comfortably?  Around house with [place].    Patient Stated Goals  Get back to normal.    Currently in Pain?  Yes    Pain Score  5     Pain Location  Knee    Pain Orientation  Right    Pain Descriptors / Indicators  Aching    Pain Type  Surgical pain    Pain Onset  1 to 4 weeks ago                       [facility] Adult PT Treatment/Exercise - 04/16/19 0001      Exercises   Exercises  Knee/Hip      Knee/Hip Exercises: Aerobic   Nustep  level 3 x 15 minutes.      Knee/Hip Exercises: Supine   Short Arc Quad Sets Limitations  16 minutes facilitated with VMS to right quads (10 sec extension holds and 10 sec rest).      Modalities   Modalities  Vasopneumatic      Vasopneumatic   Number Minutes Vasopneumatic   15 minutes    Vasopnuematic Location   --  Right knee.   Vasopneumatic Pressure  Low               PT Short Term Goals - 04/08/19 1428      PT SHORT TERM GOAL #1   Title  Full active right knee extension in order to normalize gait.        PT Long Term Goals - 04/08/19 1429      PT LONG TERM GOAL #1   Title  Independent with a HEP.    Time  8    Period  Weeks    Status  Achieved      PT LONG TERM GOAL #2   Title  Active left knee flexion to 120-125 degrees+ so the patient can perform functional tasks and do so with pain not > 2-3/10.    Time  8    Period  Weeks    Status  New      PT LONG TERM GOAL #3   Title  Increase right knee strength to a solid 5/5 to provide good stability for accomplishment of functional activities    Time  8    Period  Weeks    Status  New      PT LONG TERM GOAL #4   Title  Perform a reciprocating stair gait with one railing with pain not > 2-3/10.    Time  8    Period  Weeks    Status  New      PT LONG TERM GOAL #5   Title  Perform ADL's with pain not > 3/10.    Time  8    Period  Weeks     Status  New            Plan - 04/16/19 1224    Clinical Impression Statement  Patient doing well.  Improved ability to perform SAQ's today.  Daughter performed dressing change.    Personal Factors and Comorbidities  Age;Comorbidity 1;Comorbidity 2    Comorbidities  Left TKA, bilateral shoulder surgeries, right foot surgery, GERD, HTN.    Examination-Activity Limitations  Locomotion Level    Stability/Clinical Decision Making  Stable/Uncomplicated    Rehab Potential  Excellent    PT Treatment/Interventions  ADLs/Self Care Home Management;Cryotherapy;Electrical Stimulation;Gait training;Moist Heat;Stair training;Functional mobility training;Therapeutic activities;Therapeutic exercise;Neuromuscular re-education;Manual techniques;Patient/family education;Passive range of motion;Vasopneumatic Device    PT Next Visit Plan  Nustep.  Patient should be able to progress quickly to bike.  Please work hard on extension.  Vasopneumatic.    Consulted and Agree with Plan of Care  Patient       Patient will benefit from skilled therapeutic intervention in order to improve the following deficits and impairments:  Pain, Decreased activity tolerance, Decreased range of motion, Decreased strength, Increased edema  Visit Diagnosis: Chronic pain of right knee  Stiffness of right knee, not elsewhere classified  Localized edema  Muscle weakness (generalized)     Problem List Patient Active Problem List   Diagnosis Date Noted  . OA (osteoarthritis) of knee 08/08/2016  . Leukocytosis 04/29/2015  . Abdominal pain, left lower quadrant 04/29/2015  . Acute diverticulitis 04/27/2015  . Essential hypertension 04/27/2015    Deirdre Gryder, [place] MPT 04/16/2019, 12:27 PM  [facility] [address] [place], [place], [address] Phone: [phone]   Fax:  [phone]  [name] MRN: [id number] Date of Birth: 25-Nov-1940

## 2019-04-17 ENCOUNTER — Ambulatory Visit: Payer: Medicare Other | Admitting: Physical Therapy

## 2019-04-18 ENCOUNTER — Other Ambulatory Visit: Payer: Self-pay

## 2019-04-18 ENCOUNTER — Ambulatory Visit: Payer: Medicare Other | Admitting: Physical Therapy

## 2019-04-18 ENCOUNTER — Encounter: Payer: Self-pay | Admitting: Physical Therapy

## 2019-04-18 DIAGNOSIS — M25561 Pain in right knee: Secondary | ICD-10-CM | POA: Diagnosis not present

## 2019-04-18 DIAGNOSIS — G8929 Other chronic pain: Secondary | ICD-10-CM

## 2019-04-18 DIAGNOSIS — M6281 Muscle weakness (generalized): Secondary | ICD-10-CM

## 2019-04-18 DIAGNOSIS — R600 Localized edema: Secondary | ICD-10-CM

## 2019-04-18 DIAGNOSIS — M25661 Stiffness of right knee, not elsewhere classified: Secondary | ICD-10-CM

## 2019-04-18 NOTE — Therapy (Addendum)
[facility] [place], [place], [address] Phone: [phone]   Fax:  [phone]  Physical Therapy Treatment  Patient Details  [name] MRN: [id number] Date of Birth: 08/27/1940 Referring Provider (PT): Gaynelle Arabian MD.   Encounter Date: 04/18/2019  PT End of Session - 04/18/19 1325    Visit Number  4    Number of Visits  16    Date for PT Re-Evaluation  07/07/19    Authorization Type  FOTO AT LEAST EVERY 5TH VISIT.  PROGRESS NOTE AT 10TH VISIT.  KX MODIFIER AFTER 15 VISITS.    PT Start Time  1301    PT Stop Time  1348    PT Time Calculation (min)  47 min    Equipment Utilized During Treatment  Other (comment)   FWW   Activity Tolerance  Patient tolerated treatment well    Behavior During Therapy  WFL for tasks assessed/performed       Past Medical History:  Diagnosis Date  . Arthritis   . Cancer (HCC)    hx of skin cancer   . Complication of anesthesia   . Essential hypertension   . GERD (gastroesophageal reflux disease)   . History of hiatal hernia   . PONV (postoperative nausea and vomiting)    per pt "slow to wake, difficult to put to sleep"   . Pre-diabetes     Past Surgical History:  Procedure Laterality Date  . BILATERAL SHOULDER SURGERY    . COLONOSCOPY    . EYE SURGERY     cataract extractions   . eyebrow surgery     . RIGHT FOOT SURGERY    . 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 TOTAL KNEE ARTHROPLASTY Right 04/01/2019   Procedure: TOTAL KNEE ARTHROPLASTY;  Surgeon: Gaynelle Arabian, MD;  Location: [facility];  Service: Orthopedics;  Laterality: Right;  21min    There were no vitals filed for this visit.  Subjective Assessment - 04/18/19 1324    Subjective  COVID-19 screen performed prior to patient entering clinic.   Paitent reports no relief with swelling.    Pertinent History  Left TKA, bilateral shoulder surgeries, right foot surgery, GERD, HTN.    How long can you walk comfortably?  Around house with [place].    Patient Stated Goals  Get back to normal.    Currently in Pain?  Yes    Pain Score  6     Pain Location  Knee    Pain Orientation  Right    Pain Descriptors / Indicators  Aching    Pain Type  Surgical pain    Pain Onset  1 to 4 weeks ago    Pain Frequency  Constant         [facility] PT Assessment - 04/18/19 0001      Assessment   Medical Diagnosis  Right total knee replacement.    Referring Provider (PT)  Gaynelle Arabian MD.    Onset Date/Surgical Date  04/01/19    Next MD Visit  04/24/2019      Restrictions   Weight Bearing Restrictions  No                   [facility] Adult PT Treatment/Exercise - 04/18/19 0001      Knee/Hip Exercises: Aerobic   Nustep  L4 x19 min      Knee/Hip Exercises: Supine   Short Arc Quad Sets  AROM;Right;2 sets;10 reps    Heel Slides  AROM;Right;20 reps      Modalities   Modalities  Psychologist, educational Location  R knee    Electrical Stimulation Action  Pre-Mod    Electrical Stimulation Parameters  80-150 hz x15 min    Electrical Stimulation Goals  Pain;Edema      Vasopneumatic   Number Minutes Vasopneumatic   15 minutes    Vasopnuematic Location   Knee    Vasopneumatic Pressure  Low    Vasopneumatic Temperature   34               PT Short Term Goals - 04/08/19 1428      PT SHORT TERM GOAL #1   Title  Full active right knee extension in order to normalize gait.        PT Long Term Goals - 04/08/19 1429      PT LONG TERM GOAL #1   Title  Independent with a HEP.    Time  8    Period  Weeks    Status  Achieved      PT LONG TERM GOAL #2   Title  Active left knee flexion to 120-125 degrees+ so the patient can perform functional tasks and do so with pain  not > 2-3/10.    Time  8    Period  Weeks    Status  New      PT LONG TERM GOAL #3   Title  Increase right knee strength to a solid 5/5 to provide good stability for accomplishment of functional activities    Time  8    Period  Weeks    Status  New      PT LONG TERM GOAL #4   Title  Perform a reciprocating stair gait with one railing with pain not > 2-3/10.    Time  8    Period  Weeks    Status  New      PT LONG TERM GOAL #5   Title  Perform ADL's with pain not > 3/10.    Time  8    Period  Weeks    Status  New            Plan - 04/18/19 1339    Clinical Impression Statement  Patient presented in clinic with visible edema throughout RLE. Patient able to tolerate therex for ROM and quad strengthening fairly well even with edema. Minimal extensor lag noted during SAQ. Redness noted around R knee and all steristrips removed at this time over incision. Normal modalities response noted following removal of the modaliites.    Personal Factors and Comorbidities  Age;Comorbidity 1;Comorbidity 2    Comorbidities  Left TKA, bilateral shoulder surgeries, right foot surgery, GERD, HTN.    Examination-Activity Limitations  Locomotion Level    Stability/Clinical Decision Making  Stable/Uncomplicated    Rehab Potential  Excellent    PT Treatment/Interventions  ADLs/Self Care Home Management;Cryotherapy;Electrical Stimulation;Gait training;Moist Heat;Stair training;Functional mobility training;Therapeutic activities;Therapeutic exercise;Neuromuscular re-education;Manual techniques;Patient/family education;Passive range of motion;Vasopneumatic Device    PT Next Visit Plan  Nustep.  Patient should be able to progress quickly to bike.  Please work hard on extension.  Vasopneumatic.    Consulted and Agree with Plan of Care  Patient       Patient will  benefit from skilled therapeutic intervention in order to improve the following deficits and impairments:  Pain, Decreased activity tolerance,  Decreased range of motion, Decreased strength, Increased edema  Visit Diagnosis: Chronic pain of right knee  Stiffness of right knee, not elsewhere classified  Localized edema  Muscle weakness (generalized)     Problem List Patient Active Problem List   Diagnosis Date Noted  . OA (osteoarthritis) of knee 08/08/2016  . Leukocytosis 04/29/2015  . Abdominal pain, left lower quadrant 04/29/2015  . Acute diverticulitis 04/27/2015  . Essential hypertension 04/27/2015    Standley Brooking, PTA 04/18/2019, 2:27 PM  [facility] [address] [place], [place], [address] Phone: [phone]   Fax:  [phone]  [name] MRN: [id number] Date of Birth: Aug 02, 1940

## 2019-04-22 ENCOUNTER — Other Ambulatory Visit: Payer: Self-pay

## 2019-04-22 ENCOUNTER — Encounter: Payer: Self-pay | Admitting: Physical Therapy

## 2019-04-22 ENCOUNTER — Ambulatory Visit: Payer: Medicare Other | Admitting: Physical Therapy

## 2019-04-22 DIAGNOSIS — M6281 Muscle weakness (generalized): Secondary | ICD-10-CM

## 2019-04-22 DIAGNOSIS — G8929 Other chronic pain: Secondary | ICD-10-CM

## 2019-04-22 DIAGNOSIS — M25661 Stiffness of right knee, not elsewhere classified: Secondary | ICD-10-CM

## 2019-04-22 DIAGNOSIS — R600 Localized edema: Secondary | ICD-10-CM

## 2019-04-22 DIAGNOSIS — M25561 Pain in right knee: Secondary | ICD-10-CM | POA: Diagnosis not present

## 2019-04-22 NOTE — Therapy (Signed)
[facility] [place], [place], [address] Phone: [phone]   Fax:  [phone]  Physical Therapy Treatment  Patient Details  [name] MRN: [id number] Date of Birth: 12-27-1940 Referring Provider (PT): Gaynelle Arabian MD.   Encounter Date: 04/22/2019  PT End of Session - 04/22/19 1358    Visit Number  5    Number of Visits  16    Date for PT Re-Evaluation  07/07/19    Authorization Type  FOTO AT LEAST EVERY 5TH VISIT.  PROGRESS NOTE AT 10TH VISIT.  KX MODIFIER AFTER 15 VISITS.    PT Start Time  0116    PT Stop Time  0211    PT Time Calculation (min)  55 min    Activity Tolerance  Patient tolerated treatment well    Behavior During Therapy  [facility] for tasks assessed/performed       Past Medical History:  Diagnosis Date  . Arthritis   . Cancer (HCC)    hx of skin cancer   . Complication of anesthesia   . Essential hypertension   . GERD (gastroesophageal reflux disease)   . History of hiatal hernia   . PONV (postoperative nausea and vomiting)    per pt "slow to wake, difficult to put to sleep"   . Pre-diabetes     Past Surgical History:  Procedure Laterality Date  . BILATERAL SHOULDER SURGERY    . COLONOSCOPY    . EYE SURGERY     cataract extractions   . eyebrow surgery     . RIGHT FOOT SURGERY    . 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 TOTAL KNEE ARTHROPLASTY Right 04/01/2019   Procedure: TOTAL KNEE ARTHROPLASTY;  Surgeon: Gaynelle Arabian, MD;  Location: [facility];  Service: Orthopedics;  Laterality: Right;  29min    There were no vitals filed for this visit.  Subjective Assessment - 04/22/19 1319    Subjective  COVID-19 screen performed prior to patient entering clinic.  Paitent reported ongoing pain that is bad at times and tolerable  other times    Pertinent History  Left TKA, bilateral shoulder surgeries, right foot surgery, GERD, HTN.    How long can you walk comfortably?  Around house with [place].    Patient Stated Goals  Get back to normal.    Currently in Pain?  Yes    Pain Score  6     Pain Location  Knee    Pain Orientation  Right    Pain Descriptors / Indicators  Aching;Sore    Pain Type  Surgical pain    Pain Onset  1 to 4 weeks ago    Pain Frequency  Constant    Aggravating Factors   bending knee    Pain Relieving Factors  at rest         [facility] PT Assessment - 04/22/19 0001      ROM / Strength   AROM / PROM / Strength  AROM;PROM      AROM   AROM Assessment Site  Knee    Right/Left Knee  Right    Right Knee Extension  -19    Right Knee Flexion  108      PROM   PROM Assessment Site  Knee    Right/Left Knee  Right    Right  Knee Extension  -15    Right Knee Flexion  115                   [facility] Adult PT Treatment/Exercise - 04/22/19 0001      Knee/Hip Exercises: Aerobic   Nustep  L2 x47min UE/LE activity      Knee/Hip Exercises: Seated   Long Arc Quad  Strengthening;AROM;Right;2 sets;10 reps      Acupuncturist Location  R knee    Electrical Stimulation Action  premod    Electrical Stimulation Parameters  1-10hz  x36min    Electrical Stimulation Goals  Pain;Edema      Vasopneumatic   Number Minutes Vasopneumatic   15 minutes    Vasopnuematic Location   Knee    Vasopneumatic Pressure  Low    Vasopneumatic Temperature   34 for edema      Manual Therapy   Manual Therapy  Passive ROM;Soft tissue mobilization    Manual therapy comments  gentle patella mobs in all direction    Soft tissue mobilization  light STW to post knee to reduce tightness dueing ext stretching    Passive ROM  very gentle PROM for flexion and ext with holds to improve mobility               PT Short Term Goals - 04/08/19 1428      PT SHORT TERM GOAL #1   Title   Full active right knee extension in order to normalize gait.        PT Long Term Goals - 04/22/19 1321      PT LONG TERM GOAL #1   Title  Independent with a HEP.    Time  8    Period  Weeks    Status  Achieved      PT LONG TERM GOAL #2   Title  Active left knee flexion to 120-125 degrees+ so the patient can perform functional tasks and do so with pain not > 2-3/10.    Time  8    Period  Weeks    Status  On-going   AROM 108 degrees 04/22/19     PT LONG TERM GOAL #3   Title  Increase right knee strength to a solid 5/5 to provide good stability for accomplishment of functional activities    Time  8    Period  Weeks    Status  On-going      PT LONG TERM GOAL #4   Title  Perform a reciprocating stair gait with one railing with pain not > 2-3/10.    Time  8    Period  Weeks    Status  On-going      PT LONG TERM GOAL #5   Title  Perform ADL's with pain not > 3/10.    Time  8    Period  Weeks    Status  On-going            Plan - 04/22/19 1403    Clinical Impression Statement  Patient tolerated treatment well today although has a lot of edema. Patient reported that the doctor has removed fluid from knee. Patient able to perform gentle exercises today followed by manual gentle stretching and modalities to improve mobility and decrease pain and edema. Patient reported doing HEP daily per MD and is going for a floow up next week. Patient improved ROM this week yet limited due to edema. Patient goals progressing.  Personal Factors and Comorbidities  Age;Comorbidity 1;Comorbidity 2    Comorbidities  Left TKA, bilateral shoulder surgeries, right foot surgery, GERD, HTN.    Examination-Activity Limitations  Locomotion Level    Stability/Clinical Decision Making  Stable/Uncomplicated    Rehab Potential  Excellent    PT Treatment/Interventions  ADLs/Self Care Home Management;Cryotherapy;Electrical Stimulation;Gait training;Moist Heat;Stair training;Functional mobility  training;Therapeutic activities;Therapeutic exercise;Neuromuscular re-education;Manual techniques;Patient/family education;Passive range of motion;Vasopneumatic Device    PT Next Visit Plan  cont with POC with focus on ext to improve mobility and PRE's as able. Monitor edema/ modaliies PRN    Consulted and Agree with Plan of Care  Patient       Patient will benefit from skilled therapeutic intervention in order to improve the following deficits and impairments:  Pain, Decreased activity tolerance, Decreased range of motion, Decreased strength, Increased edema  Visit Diagnosis: Chronic pain of right knee  Stiffness of right knee, not elsewhere classified  Localized edema  Muscle weakness (generalized)     Problem List Patient Active Problem List   Diagnosis Date Noted  . OA (osteoarthritis) of knee 08/08/2016  . Leukocytosis 04/29/2015  . Abdominal pain, left lower quadrant 04/29/2015  . Acute diverticulitis 04/27/2015  . Essential hypertension 04/27/2015    Phillips Climes, PTA 04/22/2019, 2:11 PM  [facility] [address] [place], [place], [address] Phone: [phone]   Fax:  [phone]  Name: TYLON MACIOCE MRN: [id number] Date of Birth: [birth year]

## 2019-04-24 ENCOUNTER — Encounter: Payer: Medicare Other | Admitting: Physical Therapy

## 2019-04-25 ENCOUNTER — Other Ambulatory Visit: Payer: Self-pay

## 2019-04-25 ENCOUNTER — Encounter: Payer: Self-pay | Admitting: Physical Therapy

## 2019-04-25 ENCOUNTER — Ambulatory Visit: Payer: Medicare Other | Admitting: Physical Therapy

## 2019-04-25 DIAGNOSIS — R600 Localized edema: Secondary | ICD-10-CM

## 2019-04-25 DIAGNOSIS — G8929 Other chronic pain: Secondary | ICD-10-CM

## 2019-04-25 DIAGNOSIS — M25561 Pain in right knee: Secondary | ICD-10-CM | POA: Diagnosis not present

## 2019-04-25 DIAGNOSIS — M25661 Stiffness of right knee, not elsewhere classified: Secondary | ICD-10-CM

## 2019-04-25 DIAGNOSIS — M6281 Muscle weakness (generalized): Secondary | ICD-10-CM

## 2019-04-25 NOTE — Therapy (Signed)
[facility] [place], [place], [address] Phone: [phone]   Fax:  [phone]  Physical Therapy Treatment  Patient Details  [name] MRN: [id number] Date of Birth: 09-22-1940 Referring Provider (PT): Gaynelle Arabian MD.   Encounter Date: 04/25/2019  PT End of Session - 04/25/19 1305    Visit Number  6    Number of Visits  16    Date for PT Re-Evaluation  07/07/19    Authorization Type  FOTO AT LEAST EVERY 5TH VISIT.  PROGRESS NOTE AT 10TH VISIT.  KX MODIFIER AFTER 15 VISITS.    PT Start Time  1300    PT Stop Time  1356    PT Time Calculation (min)  56 min    Equipment Utilized During Treatment  Other (comment)   FWW   Activity Tolerance  Patient tolerated treatment well    Behavior During Therapy  WFL for tasks assessed/performed       Past Medical History:  Diagnosis Date  . Arthritis   . Cancer (HCC)    hx of skin cancer   . Complication of anesthesia   . Essential hypertension   . GERD (gastroesophageal reflux disease)   . History of hiatal hernia   . PONV (postoperative nausea and vomiting)    per pt "slow to wake, difficult to put to sleep"   . Pre-diabetes     Past Surgical History:  Procedure Laterality Date  . BILATERAL SHOULDER SURGERY    . COLONOSCOPY    . EYE SURGERY     cataract extractions   . eyebrow surgery     . RIGHT FOOT SURGERY    . 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 TOTAL KNEE ARTHROPLASTY Right 04/01/2019   Procedure: TOTAL KNEE ARTHROPLASTY;  Surgeon: Gaynelle Arabian, MD;  Location: [facility];  Service: Orthopedics;  Laterality: Right;  18min    There were no vitals filed for this visit.  Subjective Assessment - 04/25/19 1302    Subjective  COVID-19 screen performed prior to patient entering clinic.   Reports constant pain and continued swelling from R hip to ankle.    Pertinent History  Left TKA, bilateral shoulder surgeries, right foot surgery, GERD, HTN.    How long can you walk comfortably?  Around house with [place].    Patient Stated Goals  Get back to normal.    Currently in Pain?  Yes    Pain Score  5     Pain Location  Knee    Pain Orientation  Right    Pain Descriptors / Indicators  Constant;Discomfort    Pain Type  Surgical pain    Pain Onset  1 to 4 weeks ago    Pain Frequency  Constant         [facility] PT Assessment - 04/25/19 0001      Assessment   Medical Diagnosis  Right total knee replacement.    Referring Provider (PT)  Gaynelle Arabian MD.    Onset Date/Surgical Date  04/01/19    Next MD Visit  "Next week"      Restrictions   Weight Bearing Restrictions  No                   [facility] Adult PT Treatment/Exercise - 04/25/19 0001  Knee/Hip Exercises: Aerobic   Nustep  L4, seat 10 x18 min      Knee/Hip Exercises: Standing   Hip Flexion  AROM;Right;15 reps;Knee bent    Forward Lunges  Right;15 reps    Hip Abduction  AROM;Right;15 reps;Knee straight    Forward Step Up  Right;15 reps;Hand Hold: 2;Step Height: 6"    Rocker Board  3 minutes      Knee/Hip Exercises: Supine   Quad Sets  AROM;Right;20 reps    Short Arc [employer]  AROM;Right;20 reps      Modalities   Modalities  Software engineer Action  IFC    Electrical Stimulation Parameters  80-150 hz x15 min    Electrical Stimulation Goals  Pain;Edema      Vasopneumatic   Number Minutes Vasopneumatic   15 minutes    Vasopnuematic Location   Knee    Vasopneumatic Pressure  Low    Vasopneumatic Temperature   34 for edema               PT Short Term Goals - 04/08/19 1428      PT SHORT TERM GOAL #1   Title  Full active right knee extension in order to normalize gait.         PT Long Term Goals - 04/22/19 1321      PT LONG TERM GOAL #1   Title  Independent with a HEP.    Time  8    Period  Weeks    Status  Achieved      PT LONG TERM GOAL #2   Title  Active left knee flexion to 120-125 degrees+ so the patient can perform functional tasks and do so with pain not > 2-3/10.    Time  8    Period  Weeks    Status  On-going   AROM 108 degrees 04/22/19     PT LONG TERM GOAL #3   Title  Increase right knee strength to a solid 5/5 to provide good stability for accomplishment of functional activities    Time  8    Period  Weeks    Status  On-going      PT LONG TERM GOAL #4   Title  Perform a reciprocating stair gait with one railing with pain not > 2-3/10.    Time  8    Period  Weeks    Status  On-going      PT LONG TERM GOAL #5   Title  Perform ADL's with pain not > 3/10.    Time  8    Period  Weeks    Status  On-going            Plan - 04/25/19 1413    Clinical Impression Statement  Patient presented in clinic with mid level R knee pain. Continued excessive RLE edema notable with red patching scattered initially but dissipated during treatment. Patient able to complete all therex fairly well although limited somewhat with lunges due to excessive knee edema. Full R knee extension defiicent secondary to edema and lack of R quad activation. VCs required during SAQ and QS to ensure technique but to also increased ankle DF. Normal modalities response noted following removal of the modalities.    Personal Factors and Comorbidities  Age;Comorbidity 1;Comorbidity 2    Comorbidities  Left TKA, bilateral shoulder surgeries, right foot surgery,  GERD, HTN.    Examination-Activity Limitations  Locomotion Level    Stability/Clinical Decision Making  Stable/Uncomplicated    Rehab Potential  Excellent    PT Treatment/Interventions  ADLs/Self Care Home Management;Cryotherapy;Electrical Stimulation;Gait training;Moist Heat;Stair training;Functional mobility  training;Therapeutic activities;Therapeutic exercise;Neuromuscular re-education;Manual techniques;Patient/family education;Passive range of motion;Vasopneumatic Device    PT Next Visit Plan  cont with POC with focus on ext to improve mobility and PRE's as able. Monitor edema/ modaliies PRN    Consulted and Agree with Plan of Care  Patient       Patient will benefit from skilled therapeutic intervention in order to improve the following deficits and impairments:  Pain, Decreased activity tolerance, Decreased range of motion, Decreased strength, Increased edema  Visit Diagnosis: Chronic pain of right knee  Stiffness of right knee, not elsewhere classified  Localized edema  Muscle weakness (generalized)     Problem List Patient Active Problem List   Diagnosis Date Noted  . OA (osteoarthritis) of knee 08/08/2016  . Leukocytosis 04/29/2015  . Abdominal pain, left lower quadrant 04/29/2015  . Acute diverticulitis 04/27/2015  . Essential hypertension 04/27/2015    Standley Brooking, PTA 04/25/2019, 2:16 PM  [facility] [address] [place], [place], [address] Phone: [phone]   Fax:  [phone]  [name] MRN: [id number] Date of Birth: Apr 05, 1941

## 2019-04-29 ENCOUNTER — Encounter: Payer: Self-pay | Admitting: Physical Therapy

## 2019-04-29 ENCOUNTER — Ambulatory Visit: Payer: Medicare Other | Admitting: Physical Therapy

## 2019-04-29 ENCOUNTER — Other Ambulatory Visit: Payer: Self-pay

## 2019-04-29 DIAGNOSIS — M6281 Muscle weakness (generalized): Secondary | ICD-10-CM

## 2019-04-29 DIAGNOSIS — M25661 Stiffness of right knee, not elsewhere classified: Secondary | ICD-10-CM

## 2019-04-29 DIAGNOSIS — R600 Localized edema: Secondary | ICD-10-CM

## 2019-04-29 DIAGNOSIS — G8929 Other chronic pain: Secondary | ICD-10-CM

## 2019-04-29 DIAGNOSIS — M25561 Pain in right knee: Secondary | ICD-10-CM | POA: Diagnosis not present

## 2019-04-29 NOTE — Therapy (Signed)
[facility] [place], [place], [address] Phone: [phone]   Fax:  [phone]  Physical Therapy Treatment  Patient Details  [name] MRN: [id number] Date of Birth: 05/21/1940 Referring Provider (PT): Gaynelle Arabian MD.   Encounter Date: 04/29/2019  PT End of Session - 04/29/19 1346    Visit Number  7    Number of Visits  16    Date for PT Re-Evaluation  07/07/19    Authorization Type  FOTO AT LEAST EVERY 5TH VISIT.  PROGRESS NOTE AT 10TH VISIT.  KX MODIFIER AFTER 15 VISITS.    PT Start Time  1300    PT Stop Time  1355    PT Time Calculation (min)  55 min    Equipment Utilized During Treatment  Other (comment)    Activity Tolerance  Patient tolerated treatment well    Behavior During Therapy  WFL for tasks assessed/performed       Past Medical History:  Diagnosis Date  . Arthritis   . Cancer (HCC)    hx of skin cancer   . Complication of anesthesia   . Essential hypertension   . GERD (gastroesophageal reflux disease)   . History of hiatal hernia   . PONV (postoperative nausea and vomiting)    per pt "slow to wake, difficult to put to sleep"   . Pre-diabetes     Past Surgical History:  Procedure Laterality Date  . BILATERAL SHOULDER SURGERY    . COLONOSCOPY    . EYE SURGERY     cataract extractions   . eyebrow surgery     . RIGHT FOOT SURGERY    . 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 TOTAL KNEE ARTHROPLASTY Right 04/01/2019   Procedure: TOTAL KNEE ARTHROPLASTY;  Surgeon: Gaynelle Arabian, MD;  Location: [facility];  Service: Orthopedics;  Laterality: Right;  58min    There were no vitals filed for this visit.  Subjective Assessment - 04/29/19 1323    Subjective  COVID-19 screen performed prior to patient entering clinic.  Pt  reporting 3/10 pain upon arrival to threapy today.    Pertinent History  Left TKA, bilateral shoulder surgeries, right foot surgery, GERD, HTN.    How long can you walk comfortably?  Around house with [place].    Patient Stated Goals  Get back to normal.    Currently in Pain?  Yes    Pain Score  3     Pain Location  Knee    Pain Orientation  Right    Pain Descriptors / Indicators  Aching;Sore;Tightness    Pain Type  Surgical pain    Pain Onset  1 to 4 weeks ago    Pain Frequency  Constant    Aggravating Factors   bending    Pain Relieving Factors  resting,                       [facility] Adult PT Treatment/Exercise - 04/29/19 0001      Knee/Hip Exercises: Aerobic   Nustep  L4, seat 10 x18 min      Knee/Hip Exercises: Standing   Hip Flexion  AROM;Right;15 reps;Knee bent    Forward Lunges  Right;15 reps    Hip Abduction  AROM;Right;15 reps;Knee straight    Forward Step Up  Right;15  reps;Hand Hold: 2;Step Height: 6"    Rocker Board  3 minutes    Other Standing Knee Exercises  standing functional squats x 10      Knee/Hip Exercises: Supine   Quad Sets  AROM;Right;20 reps    Short Arc [employer]  AROM;Right;20 reps      Modalities   Modalities  Theme park manager Action  IFC    Electrical Stimulation Parameters  80-150 Hz, 15 minutes, intensity to tolerance    Electrical Stimulation Goals  Pain;Edema      Vasopneumatic   Number Minutes Vasopneumatic   15 minutes    Vasopnuematic Location   Knee    Vasopneumatic Pressure  Low    Vasopneumatic Temperature   34      Manual Therapy   Manual Therapy  Passive ROM    Passive ROM  flexion and extension with overpressure             PT Education - 04/29/19 1344    Education Details  Ice and Elevation    Person(s) Educated  Patient    Methods  Explanation;Demonstration    Comprehension  Verbalized  understanding;Returned demonstration       PT Short Term Goals - 04/08/19 1428      PT SHORT TERM GOAL #1   Title  Full active right knee extension in order to normalize gait.        PT Long Term Goals - 04/29/19 1353      PT LONG TERM GOAL #1   Title  Independent with a HEP.    Time  8    Period  Weeks    Status  Achieved      PT LONG TERM GOAL #2   Title  Active left knee flexion to 120-125 degrees+ so the patient can perform functional tasks and do so with pain not > 2-3/10.    Baseline  PROM: R knee flexion 115 degrees    Period  Weeks    Status  On-going      PT LONG TERM GOAL #3   Title  Increase right knee strength to a solid 5/5 to provide good stability for accomplishment of functional activities    Time  8    Period  Weeks    Status  On-going      PT LONG TERM GOAL #4   Title  Perform a reciprocating stair gait with one railing with pain not > 2-3/10.    Time  8    Period  Weeks    Status  On-going      PT LONG TERM GOAL #5   Title  Perform ADL's with pain not > 3/10.    Time  8    Period  Weeks    Status  On-going            Plan - 04/29/19 1346    Clinical Impression Statement  Pt arriving to therapy reporting 3/10 pain. Pt reporting no increase in pain during session. PROM: R knee flexion 115 degrees in sitting. Pt still with swelling and instructed in ice and elevation at home. Pt reporting he is scheduled to see MD tomorrow. Contiue skilled PT.    Personal Factors and Comorbidities  Age;Comorbidity 1;Comorbidity 2    Comorbidities  Left TKA, bilateral shoulder surgeries, right foot surgery, GERD, HTN.    Examination-Activity Limitations  Locomotion  Level    Stability/Clinical Decision Making  Stable/Uncomplicated    Rehab Potential  Excellent    PT Treatment/Interventions  ADLs/Self Care Home Management;Cryotherapy;Electrical Stimulation;Gait training;Moist Heat;Stair training;Functional mobility training;Therapeutic activities;Therapeutic  exercise;Neuromuscular re-education;Manual techniques;Patient/family education;Passive range of motion;Vasopneumatic Device    PT Next Visit Plan  cont with POC with focus on ext to improve mobility and PRE's as able. Monitor edema/ modaliies PRN    PT Home Exercise Plan  Quad sets, SLR, functional squats, Ice/elevation    Consulted and Agree with Plan of Care  Patient       Patient will benefit from skilled therapeutic intervention in order to improve the following deficits and impairments:  Pain, Decreased activity tolerance, Decreased range of motion, Decreased strength, Increased edema  Visit Diagnosis: Chronic pain of right knee  Stiffness of right knee, not elsewhere classified  Localized edema  Muscle weakness (generalized)     Problem List Patient Active Problem List   Diagnosis Date Noted  . OA (osteoarthritis) of knee 08/08/2016  . Leukocytosis 04/29/2015  . Abdominal pain, left lower quadrant 04/29/2015  . Acute diverticulitis 04/27/2015  . Essential hypertension 04/27/2015    Oretha Caprice, PT 04/29/2019, 1:55 PM  [facility] [address] [place], [place], [address] Phone: [phone]   Fax:  [phone]  Name: LUNDON SPORT MRN: [id number] Date of Birth: [birth year]

## 2019-05-01 ENCOUNTER — Encounter: Payer: Self-pay | Admitting: Physical Therapy

## 2019-05-01 ENCOUNTER — Ambulatory Visit: Payer: Medicare Other | Admitting: Physical Therapy

## 2019-05-01 ENCOUNTER — Other Ambulatory Visit: Payer: Self-pay

## 2019-05-01 DIAGNOSIS — M6281 Muscle weakness (generalized): Secondary | ICD-10-CM

## 2019-05-01 DIAGNOSIS — M25561 Pain in right knee: Secondary | ICD-10-CM

## 2019-05-01 DIAGNOSIS — R600 Localized edema: Secondary | ICD-10-CM

## 2019-05-01 DIAGNOSIS — G8929 Other chronic pain: Secondary | ICD-10-CM

## 2019-05-01 DIAGNOSIS — M25661 Stiffness of right knee, not elsewhere classified: Secondary | ICD-10-CM

## 2019-05-01 NOTE — Therapy (Signed)
[facility] [place], [place], [address] Phone: [phone]   Fax:  [phone]  Physical Therapy Treatment  Patient Details  [name] MRN: [id number] Date of Birth: 1941-01-18 Referring Provider (PT): Gaynelle Arabian MD.   Encounter Date: 05/01/2019  PT End of Session - 05/01/19 1343    Visit Number  8    Number of Visits  16    Date for PT Re-Evaluation  07/07/19    Authorization Type  FOTO AT LEAST EVERY 5TH VISIT.  PROGRESS NOTE AT 10TH VISIT.  KX MODIFIER AFTER 15 VISITS.    PT Start Time  0100    PT Stop Time  0150    PT Time Calculation (min)  50 min    Activity Tolerance  Patient tolerated treatment well    Behavior During Therapy  WFL for tasks assessed/performed       Past Medical History:  Diagnosis Date  . Arthritis   . Cancer (HCC)    hx of skin cancer   . Complication of anesthesia   . Essential hypertension   . GERD (gastroesophageal reflux disease)   . History of hiatal hernia   . PONV (postoperative nausea and vomiting)    per pt "slow to wake, difficult to put to sleep"   . Pre-diabetes     Past Surgical History:  Procedure Laterality Date  . BILATERAL SHOULDER SURGERY    . COLONOSCOPY    . EYE SURGERY     cataract extractions   . eyebrow surgery     . RIGHT FOOT SURGERY    . 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 TOTAL KNEE ARTHROPLASTY Right 04/01/2019   Procedure: TOTAL KNEE ARTHROPLASTY;  Surgeon: Gaynelle Arabian, MD;  Location: [facility];  Service: Orthopedics;  Laterality: Right;  27mn    There were no vitals filed for this visit.  Subjective Assessment - 05/01/19 1305    Subjective  COVID-19 screen performed prior to patient entering clinic.  Pt reporting ongoing soreness in knee 3-5/10 at times, he stated  today was last treatment due to being able to do things at home per his insurance, discussed with patient being on hold a few weeks to make sure he feels able to do HEP and to be independent with ADL's and tasks.    Pertinent History  Left TKA, bilateral shoulder surgeries, right foot surgery, GERD, HTN.    How long can you walk comfortably?  Around house with [place]    Patient Stated Goals  Get back to normal.    Currently in Pain?  Yes    Pain Score  3     Pain Location  Knee    Pain Orientation  Right    Pain Descriptors / Indicators  Discomfort    Pain Onset  1 to 4 weeks ago    Pain Frequency  Constant    Aggravating Factors   bending knee    Pain Relieving Factors  at rest and ice         [facility]      AROM   AROM Assessment Site  Knee    Right/Left Knee  Right    Right Knee Extension  -20    Right Knee Flexion  115      PROM  PROM Assessment Site  Knee    Right/Left Knee  Right    Right Knee Extension  -15    Right Knee Flexion  120      Strength   Strength Assessment Site  Knee;Hip    Right/Left Hip  Right    Right Hip ABduction  4+/5    Right/Left Knee  Right    Right Knee Flexion  4+/5    Right Knee Extension  4/5                   [facility] Adult PT Treatment/Exercise - 05/01/19 0001      Knee/Hip Exercises: Stretches   Knee: Self-Stretch to increase Flexion  Right;3 reps;30 seconds    Knee: Self-Stretch Limitations  seated on mat table to review HEP      Knee/Hip Exercises: Aerobic   Nustep  L4, seat 10 x15 min, monitored      Knee/Hip Exercises: Seated   Long Arc Quad  Strengthening;Right;3 sets;10 reps    Long Arc Quad Weight  3 lbs.      Knee/Hip Exercises: Supine   Bridges  Strengthening;Both;10 reps    Straight Leg Raises  Strengthening;Right;2 sets;10 reps      Knee/Hip Exercises: Sidelying   Hip ABduction  Strengthening;Right;10 reps      Acupuncturist Location  R knee     Electrical Stimulation Action  IFC    Electrical Stimulation Parameters  80-150hz x39mn    Electrical Stimulation Goals  Pain;Edema      Vasopneumatic   Number Minutes Vasopneumatic   10 minutes    Vasopnuematic Location   Knee    Vasopneumatic Pressure  Low    Vasopneumatic Temperature   34      Manual Therapy   Manual Therapy  Passive ROM    Passive ROM  manual PROM for flexion and extension with overpressure             PT Education - 05/01/19 1313    Education Details  HEP for home progression    Person(s) Educated  Patient    Methods  Explanation;Demonstration;Handout    Comprehension  Verbalized understanding;Returned demonstration       PT Short Term Goals - 04/08/19 1428      PT SHORT TERM GOAL #1   Title  Full active right knee extension in order to normalize gait.        PT Long Term Goals - 05/01/19 1325      PT LONG TERM GOAL #1   Title  Independent with a HEP.    Time  8    Period  Weeks    Status  Achieved      PT LONG TERM GOAL #2   Title  Active left knee flexion to 120-125 degrees+ so the patient can perform functional tasks and do so with pain not > 2-3/10.    Status  Not Met   AROM 115 degrees / PROM 120 04/01/19     PT LONG TERM GOAL #3   Title  Increase right knee strength to a solid 5/5 to provide good stability for accomplishment of functional activities    Time  8    Period  Weeks    Status  Not Met      PT LONG TERM GOAL #4   Title  Perform a reciprocating stair gait with one railing with pain not > 2-3/10.    Time  8  Period  Weeks    Status  Achieved   05/01/19     PT LONG TERM GOAL #5   Title  Perform ADL's with pain not > 3/10.    Time  8    Period  Weeks    Status  Partially Met   3-5/10 reported 05/01/19           Plan - 05/01/19 1344    Clinical Impression Statement  Patient tolerated treatment well today. Patient reported today was last treatment. Today focused on education with self stretches and  HEP for stretches and strengthening. Reviewed initial HEP for ext and today's to improve functional independence. Educated patient on ice and elevation to help with edema. Patient able to perform ADL's with greater ease and perform stairs to basement yet pain ranges from 3-5/10 with activity. Patient met two goals today with others not met due to ongoing edema ROM deficts, weakness in right LE and pain. Patient to be put on hold per PT. PT discussed plan with patients wife.    Personal Factors and Comorbidities  Age;Comorbidity 1;Comorbidity 2    Comorbidities  Left TKA, bilateral shoulder surgeries, right foot surgery, GERD, HTN.    Examination-Activity Limitations  Locomotion Level    Stability/Clinical Decision Making  Stable/Uncomplicated    Rehab Potential  Excellent    PT Treatment/Interventions  ADLs/Self Care Home Management;Cryotherapy;Electrical Stimulation;Gait training;Moist Heat;Stair training;Functional mobility training;Therapeutic activities;Therapeutic exercise;Neuromuscular re-education;Manual techniques;Patient/family education;Passive range of motion;Vasopneumatic Device    PT Next Visit Plan  on hold    Consulted and Agree with Plan of Care  Patient       Patient will benefit from skilled therapeutic intervention in order to improve the following deficits and impairments:  Pain, Decreased activity tolerance, Decreased range of motion, Decreased strength, Increased edema  Visit Diagnosis: Chronic pain of right knee  Stiffness of right knee, not elsewhere classified  Localized edema  Muscle weakness (generalized)     Problem List Patient Active Problem List   Diagnosis Date Noted  . OA (osteoarthritis) of knee 08/08/2016  . Leukocytosis 04/29/2015  . Abdominal pain, left lower quadrant 04/29/2015  . Acute diverticulitis 04/27/2015  . Essential hypertension 04/27/2015    Ladean Raya, PTA 05/01/19 1:53 PM   [facility] [address] [place], [place], [address] Phone: [phone]   Fax:  [phone]  [name] MRN: [id number] Date of Birth: 17-Oct-1940

## 2019-05-01 NOTE — Patient Instructions (Signed)
  Knee Extension (Sitting)   Place __0-3__ pound weight on left ankle and straighten knee fully, lower slowly. Repeat _10___ times per set. Do __2-3__ sets per session. Do __2-3__ sessions per day.  Strengthening: Hip Abduction (Side-Lying)  Strengthening: Straight Leg Raise (Phase 1)  Repeat _10___ times per set. Do __2__ sets per session. Do __2__ sessions per day.     Bridging   Slowly raise buttocks from floor, keeping stomach tight. Repeat _10___ times per set. Do __2__ sets per session. Do __2__ sessions per day.   Straight Leg Raise   Tighten stomach and slowly raise locked right leg __4__ inches from floor. Repeat __10-30__ times per set. Do __2__ sets per session. Do __2__ sessions per day.  Chair Knee Flexion   Keeping feet on floor, slide foot of operated leg back, bending knee. Hold ___30_ seconds. Repeat __5__ times. Do __2-4__ sessions a day.  Heel Slide

## 2019-05-21 DIAGNOSIS — Z23 Encounter for immunization: Secondary | ICD-10-CM | POA: Diagnosis not present

## 2019-07-01 DIAGNOSIS — Z23 Encounter for immunization: Secondary | ICD-10-CM | POA: Diagnosis not present

## 2019-07-08 DIAGNOSIS — H26493 Other secondary cataract, bilateral: Secondary | ICD-10-CM | POA: Diagnosis not present

## 2019-07-08 DIAGNOSIS — H04123 Dry eye syndrome of bilateral lacrimal glands: Secondary | ICD-10-CM | POA: Diagnosis not present

## 2019-07-08 DIAGNOSIS — Z961 Presence of intraocular lens: Secondary | ICD-10-CM | POA: Diagnosis not present

## 2019-07-08 DIAGNOSIS — G43809 Other migraine, not intractable, without status migrainosus: Secondary | ICD-10-CM | POA: Diagnosis not present

## 2019-07-17 DIAGNOSIS — I10 Essential (primary) hypertension: Secondary | ICD-10-CM | POA: Diagnosis not present

## 2019-07-17 DIAGNOSIS — Z125 Encounter for screening for malignant neoplasm of prostate: Secondary | ICD-10-CM | POA: Diagnosis not present

## 2019-07-24 DIAGNOSIS — I10 Essential (primary) hypertension: Secondary | ICD-10-CM | POA: Diagnosis not present

## 2019-07-24 DIAGNOSIS — R7303 Prediabetes: Secondary | ICD-10-CM | POA: Diagnosis not present

## 2019-07-24 DIAGNOSIS — K5730 Diverticulosis of large intestine without perforation or abscess without bleeding: Secondary | ICD-10-CM | POA: Diagnosis not present

## 2019-07-24 DIAGNOSIS — R311 Benign essential microscopic hematuria: Secondary | ICD-10-CM | POA: Diagnosis not present

## 2019-07-24 DIAGNOSIS — M150 Primary generalized (osteo)arthritis: Secondary | ICD-10-CM | POA: Diagnosis not present

## 2019-07-24 DIAGNOSIS — Z0000 Encounter for general adult medical examination without abnormal findings: Secondary | ICD-10-CM | POA: Diagnosis not present

## 2019-08-01 DIAGNOSIS — D2372 Other benign neoplasm of skin of left lower limb, including hip: Secondary | ICD-10-CM | POA: Diagnosis not present

## 2019-08-01 DIAGNOSIS — M79672 Pain in left foot: Secondary | ICD-10-CM | POA: Diagnosis not present

## 2019-09-20 ENCOUNTER — Encounter: Payer: Self-pay | Admitting: Physician Assistant

## 2019-09-20 ENCOUNTER — Ambulatory Visit (INDEPENDENT_AMBULATORY_CARE_PROVIDER_SITE_OTHER): Payer: Medicare Other | Admitting: Physician Assistant

## 2019-09-20 ENCOUNTER — Other Ambulatory Visit: Payer: Self-pay

## 2019-09-20 DIAGNOSIS — L578 Other skin changes due to chronic exposure to nonionizing radiation: Secondary | ICD-10-CM

## 2019-09-20 DIAGNOSIS — L739 Follicular disorder, unspecified: Secondary | ICD-10-CM

## 2019-09-20 DIAGNOSIS — D225 Melanocytic nevi of trunk: Secondary | ICD-10-CM | POA: Diagnosis not present

## 2019-09-20 DIAGNOSIS — L814 Other melanin hyperpigmentation: Secondary | ICD-10-CM | POA: Diagnosis not present

## 2019-09-20 DIAGNOSIS — D1801 Hemangioma of skin and subcutaneous tissue: Secondary | ICD-10-CM

## 2019-09-20 DIAGNOSIS — Z86006 Personal history of melanoma in-situ: Secondary | ICD-10-CM | POA: Diagnosis not present

## 2019-09-20 DIAGNOSIS — L821 Other seborrheic keratosis: Secondary | ICD-10-CM

## 2019-09-20 DIAGNOSIS — Z86018 Personal history of other benign neoplasm: Secondary | ICD-10-CM | POA: Diagnosis not present

## 2019-09-20 DIAGNOSIS — Z85828 Personal history of other malignant neoplasm of skin: Secondary | ICD-10-CM

## 2019-09-20 DIAGNOSIS — D485 Neoplasm of uncertain behavior of skin: Secondary | ICD-10-CM | POA: Diagnosis not present

## 2019-09-20 DIAGNOSIS — L570 Actinic keratosis: Secondary | ICD-10-CM

## 2019-09-20 DIAGNOSIS — Z1283 Encounter for screening for malignant neoplasm of skin: Secondary | ICD-10-CM

## 2019-09-20 DIAGNOSIS — D229 Melanocytic nevi, unspecified: Secondary | ICD-10-CM

## 2019-09-20 DIAGNOSIS — L430 Hypertrophic lichen planus: Secondary | ICD-10-CM | POA: Diagnosis not present

## 2019-09-20 MED ORDER — CLOBETASOL PROPIONATE 0.05 % EX SOLN: 1.0000 "application " | Freq: Every day | CUTANEOUS | 10 refills | Status: AC

## 2019-09-20 NOTE — Progress Notes (Addendum)
Follow-Up Visit   Subjective  John Austin is a 79 y.o. male who presents for the following: Annual Exam (No concerns). History of MM and NMSC   The following portions of the chart were reviewed this encounter and updated as appropriate: Tobacco  Allergies  Meds  Problems  Med Hx  Surg Hx  Fam Hx      Objective   Well appearing patient in no apparent distress; mood and affect are within normal limits.  All skin waist up examined.  Objective  Left Forearm - Posterior (3), Left Parotid Area, Left Postauricular Area, Left Scaphoid Fossa, Mid Parietal Scalp (3), Neck - Posterior, Right Buccal Cheek , Right Hand - Posterior, Right Temporal Scalp, Right Zygomatic Area: Erythematous patches with gritty scale.  Objective  Chest - Medial [facility]): All scars clear  Objective  left shoulder: All scars clear  Objective  Left Breast: Scars clear  Objective  Mid Back: Bichromic dark nested macule. Hyperkeratotic scale with pink base  12 cm to brown spot 1.0 cm     Objective  lower back: Bichromic dark nested macule.  13.5 to brown spot 1.2 cm     Objective  Right Upper Back: Bichromic dark nested macule.  9.5 to black spot  6 cm to white spot  1.2 cm      Objective  Right Forearm - Anterior: Volcano growth on pink base  5 to brown spot     Objective  Left Shoulder - Anterior: Scar clear  Objective  Head to Toe: All scars clear  Assessment & Plan  AK (actinic keratosis) (14) Right Hand - Posterior; Neck - Posterior; Left Forearm - Posterior (3); Left Scaphoid Fossa; Mid Parietal Scalp (3); Right Zygomatic Area; Left Parotid Area; Right Buccal Cheek ; Left Postauricular Area; Right Temporal Scalp  Destruction of lesion - Left Forearm - Posterior (3), Left Parotid Area, Left Postauricular Area, Left Scaphoid Fossa, Mid Parietal Scalp (3), Neck - Posterior, Right Buccal Cheek , Right Hand - Posterior, Right Temporal Scalp, Right Zygomatic  Area Complexity: simple   Destruction method: cryotherapy   Informed consent: discussed and consent obtained   Timeout:  patient name, date of birth, surgical site, and procedure verified Lesion destroyed using liquid nitrogen: Yes   Cryotherapy cycles:  1 Outcome: patient tolerated procedure well with no complications   Post-procedure details: wound care instructions given    Folliculitis  Reordered Medications clobetasol (TEMOVATE) 0.05 % external solution  History of SCC (squamous cell carcinoma) of skin Chest - Medial (Center)  Yearly skin checks  History of basal cell carcinoma (BCC) left shoulder  Skin exams  History of dysplastic nevus Left Breast  Skin exam  Neoplasm of uncertain behavior of skin (4) Mid Back  Skin / nail biopsy Type of biopsy: tangential   Informed consent: discussed and consent obtained   Timeout: patient name, date of birth, surgical site, and procedure verified   Anesthesia: the lesion was anesthetized in a standard fashion   Anesthetic:  1% lidocaine w/ epinephrine 1-100,000 local infiltration Instrument used: flexible razor blade   Hemostasis achieved with: aluminum chloride and electrodesiccation   Outcome: patient tolerated procedure well   Post-procedure details: wound care instructions given    Specimen 1 - Surgical pathology Differential Diagnosis: DN Check Margins: No  lower back  Skin / nail biopsy Type of biopsy: tangential   Informed consent: discussed and consent obtained   Timeout: patient name, date of birth, surgical site, and procedure verified  Anesthesia: the lesion was anesthetized in a standard fashion   Anesthetic:  1% lidocaine w/ epinephrine 1-100,000 local infiltration Instrument used: flexible razor blade   Hemostasis achieved with: aluminum chloride and electrodesiccation   Outcome: patient tolerated procedure well   Post-procedure details: wound care instructions given    Specimen 2 - Surgical  pathology Differential Diagnosis: DN Check Margins: No  Right Upper Back  Skin / nail biopsy Type of biopsy: tangential   Informed consent: discussed and consent obtained   Timeout: patient name, date of birth, surgical site, and procedure verified   Anesthesia: the lesion was anesthetized in a standard fashion   Anesthetic:  1% lidocaine w/ epinephrine 1-100,000 local infiltration Instrument used: flexible razor blade   Hemostasis achieved with: aluminum chloride and electrodesiccation   Outcome: patient tolerated procedure well   Post-procedure details: wound care instructions given    Specimen 3 - Surgical pathology Differential Diagnosis: DN Check Margins: No  Right Forearm - Anterior  Skin / nail biopsy Type of biopsy: tangential   Informed consent: discussed and consent obtained   Timeout: patient name, date of birth, surgical site, and procedure verified   Procedure prep:  Patient was prepped and draped in usual sterile fashion Prep type:  Chlorhexidine Anesthesia: the lesion was anesthetized in a standard fashion   Anesthetic:  1% lidocaine w/ epinephrine 1-100,000 local infiltration Instrument used: flexible razor blade   Hemostasis achieved with: aluminum chloride   Outcome: patient tolerated procedure well   Post-procedure details: wound care instructions given    Specimen 4 - Surgical pathology Differential Diagnosis: SCC Check Margins: No  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 - Observe - Call for any changes  Melanocytic Nevi - Tan-brown and/or pink-flesh-colored symmetric macules and papules - Benign appearing on exam  today - Observation - Call clinic for new or changing moles - Recommend daily use of broad spectrum spf 30+ sunscreen to sun-exposed areas.   Hemangiomas - Red papules - Discussed benign nature - Observe - Call for any changes  Actinic Damage - diffuse scaly erythematous macules with underlying dyspigmentation - Recommend daily broad spectrum sunscreen SPF 30+ to sun-exposed areas, reapply every 2 hours as needed.  - Call for new or changing lesions.

## 2019-09-20 NOTE — Patient Instructions (Signed)

## 2019-09-30 ENCOUNTER — Telehealth: Payer: Self-pay

## 2019-09-30 ENCOUNTER — Telehealth: Payer: Self-pay | Admitting: Physician Assistant

## 2019-09-30 NOTE — Telephone Encounter (Signed)
Results, KRS pt

## 2019-09-30 NOTE — Telephone Encounter (Signed)
Phone call to patient with his pathology results. Voicemail left for patient to give the office a call back.  ?

## 2019-09-30 NOTE — Telephone Encounter (Signed)
Phone call to patient with his pathology results. Patient aware of results.  

## 2019-09-30 NOTE — Telephone Encounter (Signed)
-----   Message from Warren Danes, [place] sent at 09/27/2019  8:55 AM EDT ----- #3 Martin Majestic

## 2019-10-03 DIAGNOSIS — M79671 Pain in right foot: Secondary | ICD-10-CM | POA: Diagnosis not present

## 2019-10-03 DIAGNOSIS — D2371 Other benign neoplasm of skin of right lower limb, including hip: Secondary | ICD-10-CM | POA: Diagnosis not present

## 2019-10-03 NOTE — Addendum Note (Signed)
Addended by: Warren Danes on: 10/03/2019 05:18 PM   Modules accepted: Level of Service

## 2019-11-25 DIAGNOSIS — L03012 Cellulitis of left finger: Secondary | ICD-10-CM | POA: Diagnosis not present

## 2019-11-25 DIAGNOSIS — Z23 Encounter for immunization: Secondary | ICD-10-CM | POA: Diagnosis not present

## 2019-11-25 DIAGNOSIS — S61432A Puncture wound without foreign body of left hand, initial encounter: Secondary | ICD-10-CM | POA: Diagnosis not present

## 2019-12-12 ENCOUNTER — Encounter: Payer: Medicare Other | Admitting: Physician Assistant

## 2019-12-12 DIAGNOSIS — D2371 Other benign neoplasm of skin of right lower limb, including hip: Secondary | ICD-10-CM | POA: Diagnosis not present

## 2019-12-12 DIAGNOSIS — B351 Tinea unguium: Secondary | ICD-10-CM | POA: Diagnosis not present

## 2020-01-15 DIAGNOSIS — I10 Essential (primary) hypertension: Secondary | ICD-10-CM | POA: Diagnosis not present

## 2020-01-15 DIAGNOSIS — R7303 Prediabetes: Secondary | ICD-10-CM | POA: Diagnosis not present

## 2020-01-15 DIAGNOSIS — K5730 Diverticulosis of large intestine without perforation or abscess without bleeding: Secondary | ICD-10-CM | POA: Diagnosis not present

## 2020-01-15 DIAGNOSIS — R311 Benign essential microscopic hematuria: Secondary | ICD-10-CM | POA: Diagnosis not present

## 2020-01-22 DIAGNOSIS — R311 Benign essential microscopic hematuria: Secondary | ICD-10-CM | POA: Diagnosis not present

## 2020-01-22 DIAGNOSIS — I10 Essential (primary) hypertension: Secondary | ICD-10-CM | POA: Diagnosis not present

## 2020-01-22 DIAGNOSIS — R7303 Prediabetes: Secondary | ICD-10-CM | POA: Diagnosis not present

## 2020-01-22 DIAGNOSIS — M150 Primary generalized (osteo)arthritis: Secondary | ICD-10-CM | POA: Diagnosis not present

## 2020-01-22 DIAGNOSIS — Z23 Encounter for immunization: Secondary | ICD-10-CM | POA: Diagnosis not present

## 2020-01-22 DIAGNOSIS — K5730 Diverticulosis of large intestine without perforation or abscess without bleeding: Secondary | ICD-10-CM | POA: Diagnosis not present

## 2020-02-04 DIAGNOSIS — M79671 Pain in right foot: Secondary | ICD-10-CM | POA: Diagnosis not present

## 2020-02-04 DIAGNOSIS — D2371 Other benign neoplasm of skin of right lower limb, including hip: Secondary | ICD-10-CM | POA: Diagnosis not present

## 2020-02-06 ENCOUNTER — Ambulatory Visit (INDEPENDENT_AMBULATORY_CARE_PROVIDER_SITE_OTHER): Payer: Medicare Other | Admitting: Dermatology

## 2020-02-06 ENCOUNTER — Other Ambulatory Visit: Payer: Self-pay

## 2020-02-06 ENCOUNTER — Encounter: Payer: Self-pay | Admitting: Dermatology

## 2020-02-06 DIAGNOSIS — D229 Melanocytic nevi, unspecified: Secondary | ICD-10-CM

## 2020-02-06 DIAGNOSIS — D225 Melanocytic nevi of trunk: Secondary | ICD-10-CM | POA: Diagnosis not present

## 2020-02-06 DIAGNOSIS — L111 Transient acantholytic dermatosis [Grover]: Secondary | ICD-10-CM

## 2020-02-06 DIAGNOSIS — L988 Other specified disorders of the skin and subcutaneous tissue: Secondary | ICD-10-CM | POA: Diagnosis not present

## 2020-02-06 NOTE — Patient Instructions (Addendum)
Surgical follow-up for John Austin date of birth 27-Oct-1940.  We carefully measured and looked at the photographs and did not alcohol rub to find the exact biopsy site of the severely atypical mole on the right upper back.  There is no residual pigmentation and 1.5 mm margin wider deeper shave done.  If you would please call the office on Monday or Tuesday next week will confirm that there is no melanoma and no more procedure needed.  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  If the shoulder does well I would recommend that he have another complete skin check done in 6 months because he does have generalized moderately severe sun damage on his scalp and face and arms.

## 2020-02-10 ENCOUNTER — Other Ambulatory Visit: Payer: Self-pay | Admitting: Dermatology

## 2020-03-01 DIAGNOSIS — Z23 Encounter for immunization: Secondary | ICD-10-CM | POA: Diagnosis not present

## 2020-03-12 NOTE — Progress Notes (Signed)
° °  Follow-Up Visit   Subjective  John Austin is a 79 y.o. male who presents for the following: Procedure (wider shave right upper back).  procedure Location:  Duration:  Quality:  Associated Signs/Symptoms: Modifying Factors:  Severity:  Timing: Context:   Objective  Well appearing patient in no apparent distress; mood and affect are within normal limits.  A focused examination was performed including upper back . Relevant physical exam findings are noted in the Assessment and Plan.    Surgical follow-up for John Austin Goto date of birth 09-Feb-1941.  We carefully measured and looked at the photographs and did not alcohol rub to find the exact biopsy site of the severely atypical mole on the right upper back.  There is no residual pigmented and 1.5 mm margin wider deeper shave done.  If you would please call the office on Monday or Tuesday next week will confirm that there is no melanoma and no more procedure needed.  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  If the shoulder does well I would recommend that he have another complete skin check done in 6 months because he does have generalized moderately severe sun damage on his scalp and face and arms.   Assessment & Plan    Atypical nevus Right Upper Back  Wider shave with 1.5 mm margin  Epidermal / dermal shaving - Right Upper Back  Lesion diameter (cm):  1.5 Informed consent: discussed and consent obtained   Timeout: patient name, date of birth, surgical site, and procedure verified   Procedure prep:  Patient was prepped and draped in usual sterile fashion Prep type:  Chlorhexidine Anesthesia: the lesion was anesthetized in a standard fashion     Anesthetic:  1% lidocaine w/ epinephrine 1-100,000 local infiltration Instrument used: flexible razor blade   Hemostasis achieved with: ferric subsulfate   Outcome: patient tolerated procedure well   Post-procedure details: sterile dressing applied and wound care instructions given   Dressing type: petrolatum    Specimen 1 - Surgical pathology Differential Diagnosis: widershave  Check Margins: yes Previous bx [id number]  Grover's disease Chest - Medial (Center)  No symptoms so for now, no intervention.     I, Lavonna Monarch, MD, have reviewed all documentation for this visit.  The documentation on 03/14/20 for the exam, diagnosis, procedures, and orders are all accurate and complete.

## 2020-03-14 ENCOUNTER — Encounter: Payer: Self-pay | Admitting: Dermatology

## 2020-03-24 ENCOUNTER — Ambulatory Visit: Payer: Medicare Other | Admitting: Physician Assistant

## 2020-03-24 DIAGNOSIS — D2371 Other benign neoplasm of skin of right lower limb, including hip: Secondary | ICD-10-CM | POA: Diagnosis not present

## 2020-03-24 DIAGNOSIS — M79671 Pain in right foot: Secondary | ICD-10-CM | POA: Diagnosis not present

## 2020-04-16 DIAGNOSIS — M79671 Pain in right foot: Secondary | ICD-10-CM | POA: Diagnosis not present

## 2020-04-16 DIAGNOSIS — D2371 Other benign neoplasm of skin of right lower limb, including hip: Secondary | ICD-10-CM | POA: Diagnosis not present

## 2020-05-05 DIAGNOSIS — D2371 Other benign neoplasm of skin of right lower limb, including hip: Secondary | ICD-10-CM | POA: Diagnosis not present

## 2020-05-05 DIAGNOSIS — M79671 Pain in right foot: Secondary | ICD-10-CM | POA: Diagnosis not present

## 2020-05-11 ENCOUNTER — Ambulatory Visit: Payer: Medicare Other | Admitting: Dermatology

## 2020-05-18 ENCOUNTER — Other Ambulatory Visit: Payer: Self-pay

## 2020-05-18 ENCOUNTER — Ambulatory Visit (INDEPENDENT_AMBULATORY_CARE_PROVIDER_SITE_OTHER): Payer: Medicare Other | Admitting: Dermatology

## 2020-05-18 DIAGNOSIS — C44629 Squamous cell carcinoma of skin of left upper limb, including shoulder: Secondary | ICD-10-CM | POA: Diagnosis not present

## 2020-05-18 DIAGNOSIS — D239 Other benign neoplasm of skin, unspecified: Secondary | ICD-10-CM

## 2020-05-18 DIAGNOSIS — L111 Transient acantholytic dermatosis [Grover]: Secondary | ICD-10-CM

## 2020-05-18 DIAGNOSIS — L570 Actinic keratosis: Secondary | ICD-10-CM

## 2020-05-18 DIAGNOSIS — D0462 Carcinoma in situ of skin of left upper limb, including shoulder: Secondary | ICD-10-CM | POA: Diagnosis not present

## 2020-05-18 DIAGNOSIS — D225 Melanocytic nevi of trunk: Secondary | ICD-10-CM

## 2020-05-18 DIAGNOSIS — D485 Neoplasm of uncertain behavior of skin: Secondary | ICD-10-CM | POA: Diagnosis not present

## 2020-05-18 MED ORDER — TRIAMCINOLONE ACETONIDE 0.1 % EX CREA: 1.0000 "application " | TOPICAL_CREAM | Freq: Every day | CUTANEOUS | 2 refills | Status: AC | PRN

## 2020-05-18 NOTE — Patient Instructions (Signed)

## 2020-05-20 ENCOUNTER — Encounter: Payer: Self-pay | Admitting: Dermatology

## 2020-05-20 NOTE — Progress Notes (Signed)
Follow-Up Visit   Subjective  John Austin is a 80 y.o. male who presents for the following: Follow-up (Patient here today for follow up form wider shave right upper back, per patient healed well. No new concerns.).  History of skin cancers and atypical moles Location: New spots left forearm Duration:  Quality:  Associated Signs/Symptoms: Modifying Factors:  Severity:  Timing: Context:   Objective  Well appearing patient in no apparent distress; mood and affect are within normal limits. Objective  Left Forearm sup.: 2 cm inflamed crust, favor UV damage/CIS over eczematous process     Objective  Left Forearm inf: 2 cm inflamed crust     Objective  Left Upper Arm - Posterior: 2.4 cm inflamed crust     Objective  Chest - Medial (Center): 2 dozen monomorphic slightly inflamed pink papules  Objective  Head - Anterior (Face): 4 mm hornlike pink crust  Objective  Right Upper Back: No repigmentation at treatment sites.  No other atypical moles waist up.   All skin waist up examined.   Assessment & Plan    Neoplasm of uncertain behavior of skin (3) Left Forearm sup.  Skin / nail biopsy Type of biopsy: tangential   Informed consent: discussed and consent obtained   Timeout: patient name, date of birth, surgical site, and procedure verified   Procedure prep:  Patient was prepped and draped in usual sterile fashion (Non sterile) Prep type:  Chlorhexidine Anesthesia: the lesion was anesthetized in a standard fashion   Anesthetic:  1% lidocaine w/ epinephrine 1-100,000 local infiltration Instrument used: flexible razor blade   Outcome: patient tolerated procedure well   Post-procedure details: wound care instructions given    Specimen 1 - Surgical pathology Differential Diagnosis: bcc scc  Check Margins: No  Left Forearm inf  Skin / nail biopsy Type of biopsy: tangential   Informed consent: discussed and consent obtained   Timeout: patient  name, date of birth, surgical site, and procedure verified   Procedure prep:  Patient was prepped and draped in usual sterile fashion (Non sterile) Prep type:  Chlorhexidine Anesthesia: the lesion was anesthetized in a standard fashion   Anesthetic:  1% lidocaine w/ epinephrine 1-100,000 local infiltration Instrument used: flexible razor blade   Outcome: patient tolerated procedure well   Post-procedure details: wound care instructions given    Specimen 2 - Surgical pathology Differential Diagnosis: bcc scc  Check Margins: No  Left Upper Arm - Posterior  Skin / nail biopsy Type of biopsy: tangential   Informed consent: discussed and consent obtained   Timeout: patient name, date of birth, surgical site, and procedure verified   Procedure prep:  Patient was prepped and draped in usual sterile fashion (Non sterile) Prep type:  Chlorhexidine Anesthesia: the lesion was anesthetized in a standard fashion   Anesthetic:  1% lidocaine w/ epinephrine 1-100,000 local infiltration Instrument used: flexible razor blade   Outcome: patient tolerated procedure well   Post-procedure details: wound care instructions given    Specimen 3 - Surgical pathology Differential Diagnosis: bcc scc  Check Margins: No  Grover's disease Chest - Medial [facility])  May use topical triamcinolone daily after bathing as needed itching.  Explained that although Grovers disease usually eventually spontaneously clears, there is no true cure.  Reordered Medications triamcinolone (KENALOG) 0.1 %  AK (actinic keratosis) Head - Anterior (Face)  Destruction of lesion - Head - Anterior (Face) Complexity: simple   Destruction method: cryotherapy   Informed consent: discussed and consent obtained  Timeout:  patient name, date of birth, surgical site, and procedure verified Lesion destroyed using liquid nitrogen: Yes   Cryotherapy cycles:  5 Outcome: patient tolerated procedure well with no complications    Post-procedure details: wound care instructions given    Dysplastic nevi Right Upper Back  Annual skin examination, patient encouraged to self examine twice annually     I, Lavonna Monarch, MD, have reviewed all documentation for this visit.  The documentation on 05/20/20 for the exam, diagnosis, procedures, and orders are all accurate and complete.

## 2020-05-26 ENCOUNTER — Telehealth: Payer: Self-pay | Admitting: *Deleted

## 2020-05-26 NOTE — Telephone Encounter (Signed)
-----   Message from Lavonna Monarch, MD sent at 05/23/2020  5:39 AM EST ----- Schedule surgery with Dr. Darene Lamer

## 2020-05-26 NOTE — Telephone Encounter (Signed)
Pathology to patient, surgery appointment scheduled with dr. Denna Haggard

## 2020-06-23 ENCOUNTER — Other Ambulatory Visit (HOSPITAL_COMMUNITY): Payer: Self-pay | Admitting: Internal Medicine

## 2020-06-23 MED FILL — MOLNUPIRAVIR 200 MG CAPS: 200 | 5 days supply | Qty: 40 | Fill #0

## 2020-07-16 ENCOUNTER — Encounter: Payer: Medicare Other | Admitting: Dermatology

## 2020-07-21 ENCOUNTER — Other Ambulatory Visit: Payer: Self-pay

## 2020-07-21 ENCOUNTER — Encounter: Payer: Self-pay | Admitting: Physician Assistant

## 2020-07-21 ENCOUNTER — Ambulatory Visit (INDEPENDENT_AMBULATORY_CARE_PROVIDER_SITE_OTHER): Payer: Medicare Other | Admitting: Physician Assistant

## 2020-07-21 DIAGNOSIS — C4492 Squamous cell carcinoma of skin, unspecified: Secondary | ICD-10-CM

## 2020-07-21 DIAGNOSIS — C44629 Squamous cell carcinoma of skin of left upper limb, including shoulder: Secondary | ICD-10-CM | POA: Diagnosis not present

## 2020-07-21 DIAGNOSIS — L988 Other specified disorders of the skin and subcutaneous tissue: Secondary | ICD-10-CM | POA: Diagnosis not present

## 2020-07-21 NOTE — Patient Instructions (Signed)

## 2020-07-29 DIAGNOSIS — R7303 Prediabetes: Secondary | ICD-10-CM | POA: Diagnosis not present

## 2020-07-29 DIAGNOSIS — I10 Essential (primary) hypertension: Secondary | ICD-10-CM | POA: Diagnosis not present

## 2020-07-29 DIAGNOSIS — N390 Urinary tract infection, site not specified: Secondary | ICD-10-CM | POA: Diagnosis not present

## 2020-07-29 DIAGNOSIS — R311 Benign essential microscopic hematuria: Secondary | ICD-10-CM | POA: Diagnosis not present

## 2020-07-29 DIAGNOSIS — M150 Primary generalized (osteo)arthritis: Secondary | ICD-10-CM | POA: Diagnosis not present

## 2020-08-04 ENCOUNTER — Encounter: Payer: Self-pay | Admitting: Physician Assistant

## 2020-08-04 NOTE — Progress Notes (Signed)
   Follow-Up Visit   Subjective  John Austin is a 80 y.o. 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   The following portions of the chart were reviewed this encounter and updated as appropriate:  Tobacco  Allergies  Meds  Problems  Med Hx  Surg Hx  Fam Hx      Objective  Well appearing patient in no apparent distress; mood and affect are within normal limits.  A focused examination was performed including left forearm. Relevant physical exam findings are noted in the Assessment and Plan.  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  Superior  Skin excision  Margin per side (cm):  0.2 Total excision diameter (cm):  6 Informed consent: discussed and consent obtained   Timeout: patient name, date of birth, surgical site, and procedure verified   Anesthesia: the lesion was anesthetized in a standard fashion   Anesthetic:  1% lidocaine w/ epinephrine 1-100,000 local infiltration Instrument used: #15 blade   Hemostasis achieved with: pressure and electrodesiccation   Outcome: patient tolerated procedure well with no complications   Post-procedure details: sterile dressing applied and wound care instructions given   Dressing type: bandage, petrolatum and pressure dressing   Additional details:  4-0 Vicryl x 3 4-0 Ethilon x 6   Skin repair Complexity:  Simple Final length (cm):  6 Informed consent: discussed and consent obtained   Timeout: patient name, date of birth, surgical site, and procedure verified   Procedure prep:  Patient was prepped and draped in usual sterile fashion (non sterile) Prep type:  Chlorhexidine Anesthesia: the lesion was anesthetized in a standard fashion   Undermining: edges undermined   Fine/surface layer approximation (top stitches):  Suture size:  4-0 Suture type: Vicryl Rapide (coated  polyglactin 910)   Suture type comment:  Nylon Stitches: simple running   Suture removal (days):  14 Hemostasis achieved with: suture, pressure and electrodesiccation Outcome: patient tolerated procedure well with no complications   Post-procedure details: wound care instructions given   Post-procedure details comment:  Non sterile pressure   Specimen 1 - Surgical pathology Differential Diagnosis: scc     Medial Margin stained  Check Margins: No   I, Elene Downum, PA-C, have reviewed all documentation's for this visit.  The documentation on 08/06/20 for the exam, diagnosis, procedures and orders are all accurate and complete.

## 2020-08-06 ENCOUNTER — Other Ambulatory Visit: Payer: Self-pay

## 2020-08-06 ENCOUNTER — Encounter: Payer: Self-pay | Admitting: Physician Assistant

## 2020-08-06 ENCOUNTER — Ambulatory Visit (INDEPENDENT_AMBULATORY_CARE_PROVIDER_SITE_OTHER): Payer: Medicare Other | Admitting: Physician Assistant

## 2020-08-06 DIAGNOSIS — C44629 Squamous cell carcinoma of skin of left upper limb, including shoulder: Secondary | ICD-10-CM

## 2020-08-06 DIAGNOSIS — D048 Carcinoma in situ of skin of other sites: Secondary | ICD-10-CM

## 2020-08-06 NOTE — Patient Instructions (Signed)

## 2020-08-17 ENCOUNTER — Telehealth: Payer: Self-pay | Admitting: Physician Assistant

## 2020-08-17 NOTE — Telephone Encounter (Signed)
Bx results, K[facility]

## 2020-08-17 NOTE — Telephone Encounter (Signed)
Mailbox full

## 2020-08-21 DIAGNOSIS — Z23 Encounter for immunization: Secondary | ICD-10-CM | POA: Diagnosis not present

## 2020-08-25 ENCOUNTER — Telehealth: Payer: Self-pay | Admitting: Physician Assistant

## 2020-08-25 NOTE — Telephone Encounter (Signed)
Called to inform patient to call us back in a few days once results are reviewed.

## 2020-08-25 NOTE — Telephone Encounter (Signed)
Patient calling for Bx resutls.

## 2020-08-27 ENCOUNTER — Encounter: Payer: Self-pay | Admitting: Physician Assistant

## 2020-08-27 NOTE — Progress Notes (Signed)
   Follow-Up Visit   Subjective  John Austin is a 80 y.o. male who presents for the following: Skin Problem (Recheck arm- left arm). Needs an excision for a well differentiated SCC with superficial infiltration. Healed well.   The following portions of the chart were reviewed this encounter and updated as appropriate:  Tobacco  Allergies  Meds  Problems  Med Hx  Surg Hx  Fam Hx      Objective  Well appearing patient in no apparent distress; mood and affect are within normal limits.  A focused examination was performed including left forearm. Relevant physical exam findings are noted in the Assessment and Plan.  Objective  Left Forearm Inf: Pink macule  Assessment & Plan  SCC (squamous cell carcinoma), arm, left Left Forearm Inf  Skin excision  Total excision diameter (cm):  4.3 Informed consent: discussed and consent obtained   Timeout: patient name, date of birth, surgical site, and procedure verified   Anesthesia: the lesion was anesthetized in a standard fashion   Anesthetic:  1% lidocaine w/ epinephrine 1-100,000 local infiltration Instrument used: #15 blade   Hemostasis achieved with: pressure and electrodesiccation   Outcome: patient tolerated procedure well with no complications   Post-procedure details: sterile dressing applied and wound care instructions given   Dressing type: bandage, petrolatum and pressure dressing    Skin repair Complexity:  Intermediate Final length (cm):  4.3 Informed consent: discussed and consent obtained   Timeout: patient name, date of birth, surgical site, and procedure verified   Procedure prep:  Patient was prepped and draped in usual sterile fashion Prep type:  Chlorhexidine Anesthesia: the lesion was anesthetized in a standard fashion   Reason for type of repair: reduce tension to allow closure, reduce the risk of dehiscence, infection, and necrosis, reduce subcutaneous dead space and avoid a hematoma, allow closure of  the large defect, preserve normal anatomy, preserve normal anatomical and functional relationships and enhance both functionality and cosmetic results   Undermining: edges undermined   Subcutaneous layers (deep stitches):  Suture size:  3-0 Suture type: Vicryl (polyglactin 910)   Stitches:  Buried vertical mattress Fine/surface layer approximation (top stitches):  Suture size:  4-0 Suture type: Vicryl (polyglactin 910)   Suture type comment:  Nylon Stitches: simple interrupted   Hemostasis achieved with: suture Outcome: patient tolerated procedure well with no complications   Post-procedure details: wound care instructions given      I, Jandy Brackens, PA-C, have reviewed all documentation's for this visit.  The documentation on 09/02/20 for the exam, diagnosis, procedures and orders are all accurate and complete.

## 2020-09-09 ENCOUNTER — Other Ambulatory Visit: Payer: Self-pay | Admitting: Dermatology

## 2020-09-10 DIAGNOSIS — M79672 Pain in left foot: Secondary | ICD-10-CM | POA: Diagnosis not present

## 2020-09-10 DIAGNOSIS — D2372 Other benign neoplasm of skin of left lower limb, including hip: Secondary | ICD-10-CM | POA: Diagnosis not present

## 2020-09-21 ENCOUNTER — Other Ambulatory Visit (HOSPITAL_BASED_OUTPATIENT_CLINIC_OR_DEPARTMENT_OTHER): Payer: Self-pay

## 2020-10-07 DIAGNOSIS — Z0000 Encounter for general adult medical examination without abnormal findings: Secondary | ICD-10-CM | POA: Diagnosis not present

## 2020-10-07 DIAGNOSIS — R311 Benign essential microscopic hematuria: Secondary | ICD-10-CM | POA: Diagnosis not present

## 2020-10-07 DIAGNOSIS — M150 Primary generalized (osteo)arthritis: Secondary | ICD-10-CM | POA: Diagnosis not present

## 2020-10-07 DIAGNOSIS — R7303 Prediabetes: Secondary | ICD-10-CM | POA: Diagnosis not present

## 2020-10-07 DIAGNOSIS — K5730 Diverticulosis of large intestine without perforation or abscess without bleeding: Secondary | ICD-10-CM | POA: Diagnosis not present

## 2020-10-07 DIAGNOSIS — I10 Essential (primary) hypertension: Secondary | ICD-10-CM | POA: Diagnosis not present

## 2020-10-07 DIAGNOSIS — R600 Localized edema: Secondary | ICD-10-CM | POA: Diagnosis not present

## 2020-11-26 DIAGNOSIS — M7741 Metatarsalgia, right foot: Secondary | ICD-10-CM | POA: Diagnosis not present

## 2020-11-26 DIAGNOSIS — D2371 Other benign neoplasm of skin of right lower limb, including hip: Secondary | ICD-10-CM | POA: Diagnosis not present

## 2020-12-13 DIAGNOSIS — Z20822 Contact with and (suspected) exposure to covid-19: Secondary | ICD-10-CM | POA: Diagnosis not present

## 2020-12-16 ENCOUNTER — Ambulatory Visit (INDEPENDENT_AMBULATORY_CARE_PROVIDER_SITE_OTHER): Payer: Medicare Other | Admitting: Physician Assistant

## 2020-12-16 ENCOUNTER — Other Ambulatory Visit: Payer: Self-pay

## 2020-12-16 ENCOUNTER — Encounter: Payer: Self-pay | Admitting: Physician Assistant

## 2020-12-16 DIAGNOSIS — C4442 Squamous cell carcinoma of skin of scalp and neck: Secondary | ICD-10-CM

## 2020-12-16 DIAGNOSIS — D485 Neoplasm of uncertain behavior of skin: Secondary | ICD-10-CM

## 2020-12-16 DIAGNOSIS — Z85820 Personal history of malignant melanoma of skin: Secondary | ICD-10-CM | POA: Diagnosis not present

## 2020-12-16 DIAGNOSIS — L570 Actinic keratosis: Secondary | ICD-10-CM | POA: Diagnosis not present

## 2020-12-16 DIAGNOSIS — Z85828 Personal history of other malignant neoplasm of skin: Secondary | ICD-10-CM

## 2020-12-16 NOTE — Patient Instructions (Signed)

## 2020-12-22 ENCOUNTER — Encounter: Payer: Self-pay | Admitting: Physician Assistant

## 2020-12-24 NOTE — Progress Notes (Signed)
   Follow-Up Visit   Subjective  John Austin is a 80 y.o. male who presents for the following: Follow-up (Lesion on head- + itch- personal history of non mole skin cancers & melanoma. No family history of melanoma or non mole skin cancer. ).   The following portions of the chart were reviewed this encounter and updated as appropriate:  Tobacco  Allergies  Meds  Problems  Med Hx  Surg Hx  Fam Hx      Objective  Well appearing patient in no apparent distress; mood and affect are within normal limits.  All skin waist up examined. All scars were clear.  Mid Frontal Scalp, Mid Occipital Scalp (3) Erythematous patches with gritty scale.  Mid Parietal Scalp Hyperkeratotic scale with pink base         Assessment & Plan  AK (actinic keratosis) (4) Mid Frontal Scalp; Mid Occipital Scalp (3)  Destruction of lesion - Mid Frontal Scalp, Mid Occipital Scalp Complexity: simple   Destruction method: cryotherapy   Informed consent: discussed and consent obtained   Timeout:  patient name, date of birth, surgical site, and procedure verified Lesion destroyed using liquid nitrogen: Yes   Cryotherapy cycles:  3 Outcome: patient tolerated procedure well with no complications    SCC (squamous cell carcinoma), scalp/neck Mid Parietal Scalp  Skin / nail biopsy Type of biopsy: tangential   Informed consent: discussed and consent obtained   Timeout: patient name, date of birth, surgical site, and procedure verified   Procedure prep:  Patient was prepped and draped in usual sterile fashion (Non sterile) Prep type:  Chlorhexidine Anesthesia: the lesion was anesthetized in a standard fashion   Anesthetic:  1% lidocaine w/ epinephrine 1-100,000 local infiltration Instrument used: flexible razor blade   Outcome: patient tolerated procedure well   Post-procedure details: wound care instructions given    Destruction of lesion Complexity: simple   Destruction method:  electrodesiccation and curettage   Informed consent: discussed and consent obtained   Timeout:  patient name, date of birth, surgical site, and procedure verified Anesthesia: the lesion was anesthetized in a standard fashion   Anesthetic:  1% lidocaine w/ epinephrine 1-100,000 local infiltration Curettage performed in three different directions: Yes   Electrodesiccation performed over the curetted area: Yes   Curettage cycles:  1 Margin per side (cm):  0.1 Final wound size (cm):  1.1 Hemostasis achieved with:  aluminum chloride Outcome: patient tolerated procedure well with no complications   Post-procedure details: wound care instructions given    Specimen 1 - Surgical pathology Differential Diagnosis: bcc vs [id number]  Check Margins: No    I, Dawnmarie Breon, PA-C, have reviewed all documentation's for this visit.  The documentation on 12/24/20 for the exam, diagnosis, procedures and orders are all accurate and complete.

## 2021-02-10 DIAGNOSIS — Z23 Encounter for immunization: Secondary | ICD-10-CM | POA: Diagnosis not present

## 2021-03-06 DIAGNOSIS — R300 Dysuria: Secondary | ICD-10-CM | POA: Diagnosis not present

## 2021-03-12 ENCOUNTER — Ambulatory Visit (HOSPITAL_COMMUNITY)
Admission: RE | Admit: 2021-03-12 | Discharge: 2021-03-12 | Disposition: A | Discharge status: Home / Self Care | Payer: Medicare Other | Source: Ambulatory Visit | Attending: Medical | Admitting: Medical

## 2021-03-12 ENCOUNTER — Other Ambulatory Visit (HOSPITAL_COMMUNITY): Payer: Self-pay | Admitting: Medical

## 2021-03-12 ENCOUNTER — Other Ambulatory Visit: Payer: Self-pay

## 2021-03-12 DIAGNOSIS — M79605 Pain in left leg: Secondary | ICD-10-CM | POA: Diagnosis not present

## 2021-03-12 DIAGNOSIS — M79661 Pain in right lower leg: Secondary | ICD-10-CM | POA: Diagnosis not present

## 2021-03-12 DIAGNOSIS — R52 Pain, unspecified: Secondary | ICD-10-CM

## 2021-03-12 DIAGNOSIS — M79604 Pain in right leg: Secondary | ICD-10-CM | POA: Diagnosis not present

## 2021-03-12 DIAGNOSIS — M25562 Pain in left knee: Secondary | ICD-10-CM | POA: Diagnosis not present

## 2021-03-15 DIAGNOSIS — Z96652 Presence of left artificial knee joint: Secondary | ICD-10-CM | POA: Diagnosis not present

## 2021-03-15 DIAGNOSIS — Z96659 Presence of unspecified artificial knee joint: Secondary | ICD-10-CM | POA: Diagnosis not present

## 2021-03-24 DIAGNOSIS — R600 Localized edema: Secondary | ICD-10-CM | POA: Diagnosis not present

## 2021-04-05 DIAGNOSIS — I10 Essential (primary) hypertension: Secondary | ICD-10-CM | POA: Diagnosis not present

## 2021-04-05 DIAGNOSIS — R269 Unspecified abnormalities of gait and mobility: Secondary | ICD-10-CM | POA: Diagnosis not present

## 2021-04-05 DIAGNOSIS — R600 Localized edema: Secondary | ICD-10-CM | POA: Diagnosis not present

## 2021-04-08 ENCOUNTER — Other Ambulatory Visit: Payer: Self-pay

## 2021-04-08 ENCOUNTER — Ambulatory Visit: Payer: Medicare Other | Attending: Internal Medicine

## 2021-04-08 DIAGNOSIS — R2689 Other abnormalities of gait and mobility: Secondary | ICD-10-CM

## 2021-04-08 DIAGNOSIS — M79671 Pain in right foot: Secondary | ICD-10-CM | POA: Diagnosis not present

## 2021-04-08 DIAGNOSIS — R262 Difficulty in walking, not elsewhere classified: Secondary | ICD-10-CM | POA: Insufficient documentation

## 2021-04-08 DIAGNOSIS — D2371 Other benign neoplasm of skin of right lower limb, including hip: Secondary | ICD-10-CM | POA: Diagnosis not present

## 2021-04-08 NOTE — Therapy (Signed)
[facility] [place], [place], [address] Phone: [phone]   Fax:  [phone]  Physical Therapy Evaluation  Patient Details  [name] MRN: [id number] Date of Birth: 05-19-1940 Referring Provider (PT): Ashby Dawes   Encounter Date: 04/08/2021   PT End of Session - 04/08/21 1259     Visit Number 1    Number of Visits 6    Date for PT Re-Evaluation 06/25/21    PT Start Time 6010    PT Stop Time 9323    PT Time Calculation (min) 50 min    Activity Tolerance Patient tolerated treatment well    Behavior During Therapy [facility] for tasks assessed/performed             Past Medical History:  Diagnosis Date   Arthritis    Atypical mole 07/29/2005   atypical on mid upper back - excision   Atypical mole 02/01/2011   atypical on right forearm - excision   Atypical mole 12/01/2017   mild atypia on left deltoid   Atypical mole 03/17/2018   mild atypia on left upper arm   Atypical mole 09/20/2019   Right Upper Back (Severe)   Basal cell carcinoma 10/11/2018   left shoulder - CX3+cautery+5FU   Cancer ([place])    hx of skin cancer    Complication of anesthesia    Essential hypertension    GERD (gastroesophageal reflux disease)    History of hiatal hernia    Melanoma ([place]) 11/15/2017   Level III on left deltoid - excision   PONV (postoperative nausea and vomiting)    per pt "slow to wake, difficult to put to sleep"    Pre-diabetes    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   well differentiated on anterior crown of scalp - tx p bx   Squamous cell carcinoma of skin 10/13/2015   in situ on left scalp - CX3+5FU   Squamous cell carcinoma of skin 02/18/2016   in situ on mid front scalp - tx p bx   Squamous cell carcinoma of skin 05/31/2016   well differentiated on left forearm - tx p bx   Squamous cell carcinoma of skin 05/18/2020   well diff- left forearm sup(EXC)   Squamous cell carcinoma of skin  05/18/2020   in situ-left forearm inf    Past Surgical History:  Procedure Laterality Date   BILATERAL SHOULDER SURGERY     COLONOSCOPY     EYE SURGERY     cataract extractions    eyebrow surgery      RIGHT FOOT SURGERY     TOTAL KNEE ARTHROPLASTY Left 08/08/2016   Procedure: LEFT TOTAL KNEE ARTHROPLASTY;  Surgeon: Gaynelle Arabian, MD;  Location: [facility];  Service: Orthopedics;  Laterality: Left;  Adductor Block; Failed Spinal   TOTAL KNEE ARTHROPLASTY Right 04/01/2019   Procedure: TOTAL KNEE ARTHROPLASTY;  Surgeon: Gaynelle Arabian, MD;  Location: [facility];  Service: Orthopedics;  Laterality: Right;  83min    There were no vitals filed for this visit.    Subjective Assessment - 04/08/21 1255     Subjective Patient's wife reports that he has had both knee's replaced. He notes that his left leg has been swollen and red for about 3 weeks. He feels like he does not have any trouble walking as it is not painful. He notes that he uses a cane to get around when the bunion on his right foot starts hurting. His wife notes that he had fallen  a few months ago while sitting on the toilet, but he was not injured and did not require medical attention.    Patient is accompained by: Family member    Pertinent History HTN, OA, bilateral TKA    Limitations --    How long can you stand comfortably? not limited    How long can you walk comfortably? not limitd    Patient Stated Goals get stronger    Currently in Pain? No/denies                [facility] PT Assessment - 04/08/21 0001       Assessment   Medical Diagnosis Gait abnormalities    Referring Provider (PT) Ashby Dawes    Onset Date/Surgical Date --   3 weeks ago   Next MD Visit 04/23/21    Prior Therapy Yes      Precautions   Precautions Fall      Restrictions   Weight Bearing Restrictions No      Balance Screen   Has the patient fallen in the past 6 months Yes    How many times? 2    Has the patient had a decrease in activity level  because of a fear of falling?  Yes    Is the patient reluctant to leave their home because of a fear of falling?  No      Home Environment   Living Environment Private residence    Type of [place] to enter    Entrance Stairs-Number of Steps 2    [place] or work area in basement      Prior Function   Level of [place] Retired    Leisure Walking      Cognition   Overall Cognitive Status Within Functional Limits for tasks assessed    Attention Focused    Focused Attention Appears intact    Memory Appears intact    Awareness Appears intact    Problem Solving Appears intact      Observation/Other Assessments-Edema    Edema Circumferential      Circumferential Edema   Circumferential - Right 15.5"   mid calf   Circumferential - Left  16"   at mid calf     Sensation   Additional Comments Patient reports no numbness or tingling      Strength   Right/Left Hip Right;Left    Right Hip Flexion 4/5    Left Hip Flexion 4/5    Right/Left Knee Right;Left    Right Knee Flexion 4+/5    Right Knee Extension 5/5    Left Knee Flexion 5/5    Left Knee Extension 5/5      Transfers   Transfers Sit to Stand;Stand to Sit    Sit to Stand 6: Modified independent (Device/Increase time);Without upper extremity assist;Multiple attempts    Five time sit to stand comments  16 seconds    Stand to Sit 7: Independent;Uncontrolled descent      Ambulation/Gait   Ambulation/Gait Yes    Ambulation/Gait Assistance 7: Independent    Assistive device None                        Objective measurements completed on examination: See above findings.       [facility] Adult PT Treatment/Exercise - 04/08/21 0001       Ambulation/Gait   Gait Pattern Decreased dorsiflexion - right;Decreased dorsiflexion -  left;Decreased step length - right;Decreased step length - left;Decreased stride length;Wide base of support    Ambulation  Surface Level;Indoor      Knee/Hip Exercises: Seated   Long Arc Quad Both;10 reps    Other Seated Knee/Hip Exercises Heel/toe raises   20 reps   Marching Both;15 reps                       PT Short Term Goals - 04/08/19 1428       PT SHORT TERM GOAL #1   Title Full active right knee extension in order to normalize gait.               PT Long Term Goals - 04/08/21 1408       PT LONG TERM GOAL #1   Title Patient will be independent with his HEP.    Time 6    Period Weeks    Status New    Target Date 05/20/21      PT LONG TERM GOAL #2   Title Patient will be able to improve his five time sit to stand to 12 seconds or less.    Baseline 16 seconds    Time 6    Period Weeks    Status New    Target Date 05/20/21                    Plan - 04/08/21 1410     Clinical Impression Statement Patient is a 80 year old male presenting to physical therapy with gait deviations and decreased lower extremity power. This is evidenced by his gait mechanics and five time sit to stand (16 seconds). He exhibited increased edema in his left lower extremity as this was half an inch greater at his mid calf on the left compared to the right.  Recommend that he continue with his plan of care to address his remaining impairments to maximize his functional mobility.    Personal Factors and Comorbidities Comorbidity 1;Comorbidity 2    Comorbidities HTN, OA    Examination-Activity Limitations Transfers    Stability/Clinical Decision Making Stable/Uncomplicated    Clinical Decision Making Low    Rehab Potential Good    PT Frequency 1x / week    PT Duration 6 weeks    PT Treatment/Interventions Gait training;Stair training;Functional mobility training;Therapeutic activities;Therapeutic exercise;Balance training;Neuromuscular re-education;Patient/family education;Energy conservation;Vasopneumatic Device    PT Next Visit Plan nustep/recumbent bike, sit to stands (w/o UE support,  balance activities    PT Home Exercise Plan Access Code: [email or web address]  URL: [email or web address]  Date: 04/08/2021  Prepared by: Jacqulynn Cadet    Exercises  Seated Heel Toe Raises - 3 x daily - 7 x weekly - 3 sets - 10 reps  Seated March - 3 x daily - 7 x weekly - 3 sets - 10 reps  Ankle Pumps in Elevation - 3 x daily - 7 x weekly - 3 sets - 10 reps  Seated Long Arc Quad - 3 x daily - 7 x weekly - 3 sets - 10 reps    Consulted and Agree with Plan of Care Patient;Family member/caregiver    Family Member Consulted Wife             Patient will benefit from skilled therapeutic intervention in order to improve the following deficits and impairments:  Abnormal gait, Decreased balance, Increased edema, Decreased strength  Visit Diagnosis: Other abnormalities of gait and mobility     Problem List  Patient Active Problem List   Diagnosis Date Noted   OA (osteoarthritis) of knee 08/08/2016   Leukocytosis 04/29/2015   Abdominal pain, left lower quadrant 04/29/2015   Acute diverticulitis 04/27/2015   Essential hypertension 04/27/2015    Darlin Coco, PT 04/08/2021, 2:22 PM  [facility] [address] [place], [place], [address] Phone: [phone]   Fax:  [phone]  [name] MRN: [id number] Date of Birth: 05-11-1940

## 2021-04-15 ENCOUNTER — Encounter: Payer: Self-pay | Admitting: Physical Therapy

## 2021-04-15 ENCOUNTER — Ambulatory Visit: Payer: Medicare Other | Admitting: Physical Therapy

## 2021-04-15 ENCOUNTER — Other Ambulatory Visit: Payer: Self-pay

## 2021-04-15 DIAGNOSIS — R2689 Other abnormalities of gait and mobility: Secondary | ICD-10-CM

## 2021-04-15 DIAGNOSIS — R262 Difficulty in walking, not elsewhere classified: Secondary | ICD-10-CM | POA: Diagnosis not present

## 2021-04-15 NOTE — Therapy (Addendum)
[facility] [place], [place], [address] Phone: [phone]   Fax:  [phone]  Physical Therapy Treatment  Patient Details  [name] MRN: [id number] Date of Birth: [birth year] Referring Provider (PT): Ashby Dawes   Encounter Date: 04/15/2021   PT End of Session - 04/15/21 1130     Visit Number 2    Number of Visits 6    Date for PT Re-Evaluation 06/25/21    PT Start Time 1127    PT Stop Time 1200    PT Time Calculation (min) 33 min    Activity Tolerance Patient tolerated treatment well    Behavior During Therapy [facility] for tasks assessed/performed             Past Medical History:  Diagnosis Date   Arthritis    Atypical mole 07/29/2005   atypical on mid upper back - excision   Atypical mole 02/01/2011   atypical on right forearm - excision   Atypical mole 12/01/2017   mild atypia on left deltoid   Atypical mole 03/17/2018   mild atypia on left upper arm   Atypical mole 09/20/2019   Right Upper Back (Severe)   Basal cell carcinoma 10/11/2018   left shoulder - CX3+cautery+5FU   Cancer ([place])    hx of skin cancer    Complication of anesthesia    Essential hypertension    GERD (gastroesophageal reflux disease)    History of hiatal hernia    Melanoma ([place]) 11/15/2017   Level III on left deltoid - excision   PONV (postoperative nausea and vomiting)    per pt "slow to wake, difficult to put to sleep"    Pre-diabetes    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   well differentiated on anterior crown of scalp - tx p bx   Squamous cell carcinoma of skin 10/13/2015   in situ on left scalp - CX3+5FU   Squamous cell carcinoma of skin 02/18/2016   in situ on mid front scalp - tx p bx   Squamous cell carcinoma of skin 05/31/2016   well differentiated on left forearm - tx p bx   Squamous cell carcinoma of skin 05/18/2020   well diff- left forearm sup(EXC)   Squamous cell carcinoma of skin 05/18/2020    in situ-left forearm inf    Past Surgical History:  Procedure Laterality Date   BILATERAL SHOULDER SURGERY     COLONOSCOPY     EYE SURGERY     cataract extractions    eyebrow surgery      RIGHT FOOT SURGERY     TOTAL KNEE ARTHROPLASTY Left 08/08/2016   Procedure: LEFT TOTAL KNEE ARTHROPLASTY;  Surgeon: Gaynelle Arabian, MD;  Location: [facility];  Service: Orthopedics;  Laterality: Left;  Adductor Block; Failed Spinal   TOTAL KNEE ARTHROPLASTY Right 04/01/2019   Procedure: TOTAL KNEE ARTHROPLASTY;  Surgeon: Gaynelle Arabian, MD;  Location: [facility];  Service: Orthopedics;  Laterality: Right;  95mn    There were no vitals filed for this visit.   Subjective Assessment - 04/15/21 1129     Subjective No falls. Reports staying busy.    Pertinent History HTN, OA, bilateral TKA    How long can you stand comfortably? not limited    How long can you walk comfortably? not limitd    Patient Stated Goals get stronger    Currently in Pain? No/denies                [facility]  [facility] - 04/15/21 0001       Assessment   Medical Diagnosis Gait abnormalities    Referring Provider (PT) Ashby Dawes    Next MD Visit 04/23/21    Prior Therapy Yes      Precautions   Precautions Fall      Restrictions   Weight Bearing Restrictions No                           [facility] Adult PT Treatment/Exercise - 04/15/21 0001       Exercises   Exercises Knee/Hip      Knee/Hip Exercises: Aerobic   Nustep L3 x15 min      Knee/Hip Exercises: Standing   Heel Raises Both;20 reps    Heel Raises Limitations B toe raise x20 reps    Hip Flexion AROM;Both;20 reps;Knee bent    Hip Abduction AROM;Both;20 reps;Knee straight      Knee/Hip Exercises: Seated   Long Arc Quad Strengthening;Both;15 reps;Weights    Long Arc Quad Weight 3 lbs.    Hamstring Curl Strengthening;Both;2 sets;10 reps;Limitations    Hamstring Limitations red theraband                          PT Long  Term Goals - 04/08/21 1408       PT LONG TERM GOAL #1   Title Patient will be independent with his HEP.    Time 6    Period Weeks    Status New    Target Date 05/20/21      PT LONG TERM GOAL #2   Title Patient will be able to improve his five time sit to stand to 12 seconds or less.    Baseline 16 seconds    Time 6    Period Weeks    Status New    Target Date 05/20/21                   Plan - 04/15/21 1209     Clinical Impression Statement Patient presented in clinic with no reports of falls. Patient does report LE weakness with prolonged activity. Patient observed taking small heel strike but fair foot clearance. Patient able to tolerate therex well with no complaints. Patient does indicate balance deficits intermittantly but no excessive fatigue from therex.    Personal Factors and Comorbidities Comorbidity 1;Comorbidity 2    Comorbidities HTN, OA    Examination-Activity Limitations Transfers    Stability/Clinical Decision Making Stable/Uncomplicated    Rehab Potential Good    PT Frequency 1x / week    PT Duration 6 weeks    PT Treatment/Interventions Gait training;Stair training;Functional mobility training;Therapeutic activities;Therapeutic exercise;Balance training;Neuromuscular re-education;Patient/family education;Energy conservation;Vasopneumatic Device    PT Next Visit Plan nustep/recumbent bike, sit to stands (w/o UE support, balance activities    PT Home Exercise Plan Access Code: [email or web address]  URL: [email or web address]  Date: 04/08/2021  Prepared by: Jacqulynn Cadet    Exercises  Seated Heel Toe Raises - 3 x daily - 7 x weekly - 3 sets - 10 reps  Seated March - 3 x daily - 7 x weekly - 3 sets - 10 reps  Ankle Pumps in Elevation - 3 x daily - 7 x weekly - 3 sets - 10 reps  Seated Long Arc Quad - 3 x daily - 7 x weekly - 3 sets - 10 reps    Consulted and Agree with Plan of Care  Patient             Patient will benefit from skilled therapeutic  intervention in order to improve the following deficits and impairments:  Abnormal gait, Decreased balance, Increased edema, Decreased strength  Visit Diagnosis: Other abnormalities of gait and mobility     Problem List Patient Active Problem List   Diagnosis Date Noted   OA (osteoarthritis) of knee 08/08/2016   Leukocytosis 04/29/2015   Abdominal pain, left lower quadrant 04/29/2015   Acute diverticulitis 04/27/2015   Essential hypertension 04/27/2015    Standley Brooking, PTA 04/15/2021, 12:29 PM  [facility] [address] [place], [place], [address] Phone: [phone]   Fax:  [phone]  [name] MRN: [id number] Date of Birth: 05/27/1940  PHYSICAL THERAPY DISCHARGE SUMMARY  Visits from Start of Care: 2  Current functional level related to goals / functional outcomes: Patient is being discharged at this time as he has not attended physical therapy since his appointment on 04/15/21.   Remaining deficits: No significant change from evaluation    Education / Equipment: HEP    Patient agrees to discharge. Patient goals were not met. Patient is being discharged due to not returning since the last visit.  Jacqulynn Cadet, PT, DPT

## 2021-04-23 DIAGNOSIS — R600 Localized edema: Secondary | ICD-10-CM | POA: Diagnosis not present

## 2021-04-27 ENCOUNTER — Other Ambulatory Visit: Payer: Self-pay

## 2021-04-27 ENCOUNTER — Emergency Department (HOSPITAL_COMMUNITY): Payer: Medicare Other

## 2021-04-27 ENCOUNTER — Emergency Department (HOSPITAL_COMMUNITY)
Admission: EM | Admit: 2021-04-27 | Discharge: 2021-04-27 | Disposition: A | Discharge status: Home / Self Care | Payer: Medicare Other | Attending: Emergency Medicine | Admitting: Emergency Medicine

## 2021-04-27 ENCOUNTER — Encounter (HOSPITAL_COMMUNITY): Payer: Self-pay | Admitting: Emergency Medicine

## 2021-04-27 DIAGNOSIS — N5082 Scrotal pain: Secondary | ICD-10-CM | POA: Diagnosis not present

## 2021-04-27 DIAGNOSIS — Z87891 Personal history of nicotine dependence: Secondary | ICD-10-CM | POA: Diagnosis not present

## 2021-04-27 DIAGNOSIS — Z79899 Other long term (current) drug therapy: Secondary | ICD-10-CM | POA: Diagnosis not present

## 2021-04-27 DIAGNOSIS — N452 Orchitis: Secondary | ICD-10-CM | POA: Diagnosis not present

## 2021-04-27 DIAGNOSIS — Z85828 Personal history of other malignant neoplasm of skin: Secondary | ICD-10-CM | POA: Diagnosis not present

## 2021-04-27 DIAGNOSIS — E876 Hypokalemia: Secondary | ICD-10-CM

## 2021-04-27 DIAGNOSIS — N390 Urinary tract infection, site not specified: Secondary | ICD-10-CM

## 2021-04-27 DIAGNOSIS — N5089 Other specified disorders of the male genital organs: Secondary | ICD-10-CM | POA: Diagnosis not present

## 2021-04-27 DIAGNOSIS — N433 Hydrocele, unspecified: Secondary | ICD-10-CM | POA: Insufficient documentation

## 2021-04-27 DIAGNOSIS — Z7982 Long term (current) use of aspirin: Secondary | ICD-10-CM | POA: Insufficient documentation

## 2021-04-27 DIAGNOSIS — Z96653 Presence of artificial knee joint, bilateral: Secondary | ICD-10-CM | POA: Diagnosis not present

## 2021-04-27 DIAGNOSIS — Z20822 Contact with and (suspected) exposure to covid-19: Secondary | ICD-10-CM | POA: Diagnosis not present

## 2021-04-27 DIAGNOSIS — R600 Localized edema: Secondary | ICD-10-CM | POA: Diagnosis not present

## 2021-04-27 DIAGNOSIS — I10 Essential (primary) hypertension: Secondary | ICD-10-CM | POA: Diagnosis not present

## 2021-04-27 DIAGNOSIS — N453 Epididymo-orchitis: Secondary | ICD-10-CM | POA: Diagnosis not present

## 2021-04-27 DIAGNOSIS — N503 Cyst of epididymis: Secondary | ICD-10-CM | POA: Diagnosis not present

## 2021-04-27 LAB — COMPREHENSIVE METABOLIC PANEL
ALT: 13 U/L (ref 0–44)
AST: 19 U/L (ref 15–41)
Albumin: 3.5 g/dL (ref 3.5–5.0)
Alkaline Phosphatase: 61 U/L (ref 38–126)
Anion gap: 10 (ref 5–15)
BUN: 9 mg/dL (ref 8–23)
CO2: 26 mmol/L (ref 22–32)
Calcium: 8.6 mg/dL — ABNORMAL LOW (ref 8.9–10.3)
Chloride: 97 mmol/L — ABNORMAL LOW (ref 98–111)
Creatinine, Ser: 0.75 mg/dL (ref 0.61–1.24)
GFR, Estimated: >60 mL/min (ref >60)
Glucose, Bld: 121 mg/dL — ABNORMAL HIGH (ref 70–99)
Potassium: 2.6 mmol/L — CL (ref 3.5–5.1)
Sodium: 133 mmol/L — ABNORMAL LOW (ref 135–145)
Total Bilirubin: 1.8 mg/dL — ABNORMAL HIGH (ref 0.3–1.2)
Total Protein: 6.7 g/dL (ref 6.5–8.1)

## 2021-04-27 LAB — URINALYSIS, ROUTINE W REFLEX MICROSCOPIC
Bilirubin Urine: NEGATIVE
Glucose, UA: NEGATIVE mg/dL
Ketones, ur: NEGATIVE mg/dL
Nitrite: NEGATIVE
Protein, ur: NEGATIVE mg/dL
Specific Gravity, Urine: 1.005 (ref 1.005–1.030)
pH: 7 (ref 5.0–8.0)

## 2021-04-27 LAB — CBC WITH DIFFERENTIAL/PLATELET
Abs Immature Granulocytes: 0.06 10*3/uL (ref 0.00–0.07)
Basophils Absolute: 0 10*3/uL (ref 0.0–0.1)
Basophils Relative: 0 %
Eosinophils Absolute: 0.1 10*3/uL (ref 0.0–0.5)
Eosinophils Relative: 1 %
HCT: 40.6 % (ref 39.0–52.0)
Hemoglobin: 13.8 g/dL (ref 13.0–17.0)
Immature Granulocytes: 0 %
Lymphocytes Relative: 10 %
Lymphs Abs: 1.3 10*3/uL (ref 0.7–4.0)
MCH: 29 pg (ref 26.0–34.0)
MCHC: 34 g/dL (ref 30.0–36.0)
MCV: 85.3 fL (ref 80.0–100.0)
Monocytes Absolute: 1.4 10*3/uL — ABNORMAL HIGH (ref 0.1–1.0)
Monocytes Relative: 11 %
Neutro Abs: 10.5 10*3/uL — ABNORMAL HIGH (ref 1.7–7.7)
Neutrophils Relative %: 78 %
Platelets: 244 10*3/uL (ref 150–400)
RBC: 4.76 MIL/uL (ref 4.22–5.81)
RDW: 14.2 % (ref 11.5–15.5)
WBC: 13.4 10*3/uL — ABNORMAL HIGH (ref 4.0–10.5)
nRBC: 0 % (ref 0.0–0.2)

## 2021-04-27 LAB — RESP PANEL BY RT-PCR (FLU A&B, COVID) ARPGX2
Influenza A by PCR: NEGATIVE
Influenza B by PCR: NEGATIVE
SARS Coronavirus 2 by RT PCR: NEGATIVE

## 2021-04-27 MED ORDER — LEVOFLOXACIN 500 MG PO TABS: 500.0000 mg | ORAL_TABLET | Freq: Every day | ORAL | 0 refills | Status: AC

## 2021-04-27 MED ORDER — POTASSIUM CHLORIDE ER 10 MEQ PO TBCR: 10.0000 meq | EXTENDED_RELEASE_TABLET | Freq: Two times a day (BID) | ORAL | 0 refills | Status: AC

## 2021-04-27 MED ORDER — POTASSIUM CHLORIDE CRYS ER 20 MEQ PO TBCR
60.0000 meq | EXTENDED_RELEASE_TABLET | Freq: Once | ORAL | Status: AC
  Administered 2021-04-27: 14:00:00 60 meq via ORAL
  Filled 2021-04-27: qty 3

## 2021-04-27 NOTE — ED Provider Notes (Signed)
[facility] DEPT Provider Note   CSN: [id number] Arrival date & time: 04/27/21  1130     History Chief Complaint  Patient presents with   Groin Swelling    John Austin is a 80 y.o. male.  80 year old male presents for 2-week swelling of the scrotum.  Has noted increased erythema.  Denies any dysuria or hematuria.  No perianal discomfort noted.  Denies any trauma.  No prior history of same.  Saw his physician today and was sent here for further management.      Past Medical History:  Diagnosis Date   Arthritis    Atypical mole 07/29/2005   atypical on mid upper back - excision   Atypical mole 02/01/2011   atypical on right forearm - excision   Atypical mole 12/01/2017   mild atypia on left deltoid   Atypical mole 03/17/2018   mild atypia on left upper arm   Atypical mole 09/20/2019   Right Upper Back (Severe)   Basal cell carcinoma 10/11/2018   left shoulder - CX3+cautery+5FU   Cancer (HCC)    hx of skin cancer    Complication of anesthesia    Essential hypertension    GERD (gastroesophageal reflux disease)    History of hiatal hernia    Melanoma ([place]) 11/15/2017   Level III on left deltoid - excision   PONV (postoperative nausea and vomiting)    per pt "slow to wake, difficult to put to sleep"    Pre-diabetes    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   well differentiated on anterior crown of scalp - tx p bx   Squamous cell carcinoma of skin 10/13/2015   in situ on left scalp - CX3+5FU   Squamous cell carcinoma of skin 02/18/2016   in situ on mid front scalp - tx p bx   Squamous cell carcinoma of skin 05/31/2016   well differentiated on left forearm - tx p bx   Squamous cell carcinoma of skin 05/18/2020   well diff- left forearm sup(EXC)   Squamous cell carcinoma of skin 05/18/2020   in situ-left forearm inf    Patient Active Problem List   Diagnosis Date Noted   OA (osteoarthritis) of knee 08/08/2016   Leukocytosis  04/29/2015   Abdominal pain, left lower quadrant 04/29/2015   Acute diverticulitis 04/27/2015   Essential hypertension 04/27/2015    Past Surgical History:  Procedure Laterality Date   BILATERAL SHOULDER SURGERY     COLONOSCOPY     EYE SURGERY     cataract extractions    eyebrow surgery      RIGHT FOOT SURGERY     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 No pertinent family history.  Social History   Tobacco Use   Smoking status: Former   Smokeless tobacco: Current    Types: Chew   Tobacco comments:    greater than 30 years   Vaping Use   Vaping Use: Never used  Substance Use Topics   Alcohol use: No    Comment: quit due to divertuclitis    Drug use: No    Home Medications  Prior to Admission medications   Medication Sig Start Date End Date Taking? Authorizing Provider  azelastine (OPTIVAR) 0.05 % ophthalmic solution Place 1 drop into both eyes 2 (two) times daily.    [provider]  CARTIA XT 180 MG 24 hr capsule Take 180 mg by mouth daily. 05/21/19   [provider]  celecoxib (CELEBREX) 200 MG capsule TAKE 1 CAPSULE BY MOUTH TWICE DAILY AFTER A MEAL 10/19/19   [provider]  Cinnamon 500 MG TABS Take 500 mg by mouth daily.    [provider]  clobetasol (TEMOVATE) 0.05 % external solution Apply 1 application topically daily. 09/20/19   Sheffield, Ronalee Red, PA-C  diltiazem (TIAZAC) 180 MG 24 hr capsule Take 180 mg by mouth every evening.    [provider]  docusate sodium (COLACE) 100 MG capsule Take 100-200 mg by mouth daily as needed (constipation.).      [provider]  doxycycline (VIBRAMYCIN) 50 MG capsule Take 50 mg by mouth 2 (two) times daily.    [provider]  Doxycycline Hyclate 50 MG TABS Take 1 tablet by mouth twice daily 09/09/20   Lavonna Monarch, MD  EPINEPHrine 0.3 mg/0.3 mL IJ SOAJ injection USE AS DIRECTED AS NEEDED FOR SYSTEMIC REACTION 07/15/19   [provider]  EQ ASPIRIN 325 MG tablet TAKE 1 TABLET BY MOUTH TWICE DAILY FOR 20 DAYS THEN RESUME ONE 81MG  ASPIRIN ONCE A DAY 04/02/19   [provider]  fexofenadine (ALLEGRA) 180 MG tablet Take 180 mg by mouth daily as needed for allergies.     [provider]  fluticasone (FLONASE) 50 MCG/ACT nasal spray Place 1-2 sprays into both nostrils at bedtime. Allergies. 03/30/15   [provider]  gabapentin (NEURONTIN) 100 MG capsule Take 2 capsules (200 mg total) by mouth 3 (three) times daily. Take 200 mg three times a day for two weeks following surgery.Then take 200 mg two times a day for two weeks. Then take 200 mg once a day for two weeks. Then discontinue. 04/02/19   Edmisten, Kristie L, PA  ketoconazole (NIZORAL) 2 % shampoo Apply 1 application topically 3 (three) times a week.  03/20/19   [provider]  meclizine (ANTIVERT) 25 MG tablet Take 12.5-25 mg by mouth daily as needed (vertigo). dizziness 02/02/15   [provider]  methocarbamol (ROBAXIN) 500 MG tablet Take 1 tablet (500 mg total) by mouth every 6 (six) hours as needed for muscle spasms. 04/02/19   Edmisten, Kristie L, PA  metroNIDAZOLE (METROCREAM) 0.75 % cream Apply 1 application topically daily.     [provider]  Molnupiravir 200 MG CAPS TAKE 4 CAPSULES BY MOUTH EVERY 12 HOURS FOR 5 DAYS 06/23/20 06/23/21  Merrilee Seashore, MD  Multiple Vitamin (MULTIVITAMIN WITH MINERALS) TABS tablet Take 1 tablet by mouth daily.    [provider]  Omega-3 Fatty Acids (FISH OIL PO) Take 1 capsule by mouth daily.    [provider]   oxyCODONE (OXY IR/ROXICODONE) 5 MG immediate release tablet Take 1-2 tablets (5-10 mg total) by mouth every 6 (six) hours as needed for severe pain. 04/02/19   Edmisten, Ok Anis, PA  traMADol (ULTRAM) 50 MG tablet Take 1-2 tablets (50-100 mg total) by mouth every 6 (six) hours as needed for moderate pain. 04/02/19   Edmisten, Kristie L, PA  triamcinolone (KENALOG) 0.1 % Apply 1 application topically daily as needed (rosacea). 05/18/20   Lavonna Monarch, MD    Allergies  Patient has no known allergies.  Review of Systems   Review of Systems  All other systems reviewed and are negative.  Physical Exam Updated Vital Signs BP (!) 142/72    Pulse 81    Temp 97.9 F (36.6 C) (Oral)    Resp 17    Ht 1.803 m (5\' 11" )    Wt 90.7 kg    SpO2 98%    BMI 27.89 kg/m   Physical Exam Vitals and nursing note reviewed.  Constitutional:      General: He is not in acute distress.    Appearance: Normal appearance. He is well-developed. He is not toxic-appearing.  HENT:     Head: Normocephalic and atraumatic.  Eyes:     General: Lids are normal.     Conjunctiva/sclera: Conjunctivae normal.     Pupils: Pupils are equal, round, and reactive to light.  Neck:     Thyroid: No thyroid mass.     Trachea: No tracheal deviation.  Cardiovascular:     Rate and Rhythm: Normal rate and regular rhythm.     Heart sounds: Normal heart sounds. No murmur heard.   No gallop.  Pulmonary:     Effort: Pulmonary effort is normal. No respiratory distress.     Breath sounds: Normal breath sounds. No stridor. No decreased breath sounds, wheezing, rhonchi or rales.  Abdominal:     General: There is no distension.     Palpations: Abdomen is soft.     Tenderness: There is no abdominal tenderness. There is no rebound.  Genitourinary:    Epididymis:     Left: Enlarged. Mass present.     Comments: Scrotal edema and erythema appreciated.  Tender to palpation over left testicle and epididymis. Musculoskeletal:         General: No tenderness. Normal range of motion.     Cervical back: Normal range of motion and neck supple.  Skin:    General: Skin is warm and dry.     Findings: No abrasion or rash.  Neurological:     Mental Status: He is alert and oriented to person, place, and time. Mental status is at baseline.     GCS: GCS eye subscore is 4. GCS verbal subscore is 5. GCS motor subscore is 6.     Cranial Nerves: No cranial nerve deficit.     Sensory: No sensory deficit.     Motor: Motor function is intact.  Psychiatric:        Attention and Perception: Attention normal.        Speech: Speech normal.        Behavior: Behavior normal.    ED Results / Procedures / Treatments   Labs (all labs ordered are listed, but only abnormal results are displayed) Labs Reviewed  CBC WITH DIFFERENTIAL/PLATELET - Abnormal; Notable for the following components:      Result Value   WBC 13.4 (*)    Neutro Abs 10.5 (*)    Monocytes Absolute 1.4 (*)    All other components within normal limits  COMPREHENSIVE METABOLIC PANEL - Abnormal; Notable for the following components:   Sodium 133 (*)    Potassium 2.6 (*)    Chloride 97 (*)    Glucose, Bld 121 (*)    Calcium 8.6 (*)    Total Bilirubin 1.8 (*)    All other components within normal limits  RESP PANEL BY RT-PCR (FLU A&B, COVID) ARPGX2  URINALYSIS, ROUTINE W REFLEX MICROSCOPIC    EKG None  Radiology US SCROTUM W/DOPPLER  Result Date: 04/27/2021 CLINICAL DATA:  Scrotal pain EXAM: SCROTAL ULTRASOUND DOPPLER ULTRASOUND OF THE TESTICLES TECHNIQUE: Complete ultrasound examination of the testicles, epididymis, and other scrotal structures was performed. Color and spectral Doppler ultrasound were also utilized to evaluate blood flow to the testicles. COMPARISON:  None. FINDINGS: Right testicle Measurements: 4.9 x 3.7 x 2.9 cm. No mass or microlithiasis visualized. Left testicle Measurements: 6.0 x 3.8 x 4.4 cm. Mildly heterogeneous and hypervascular in  comparison to the right testicle. Right epididymis:  Normal in size and appearance. Left epididymis: Mildly enlarged with mildly increased vascularity. Small complex cystic area measuring 7 x 8 mm. Hydrocele:  Moderate-sized left-sided hydrocele. Varicocele:  None visualized. Pulsed Doppler interrogation of both testes demonstrates normal low resistance arterial and venous waveforms bilaterally. IMPRESSION: Findings are suggestive of left-sided epididymo-orchitis. Moderate reactive left-sided hydrocele. Small, 8 mm complex cystic lesion in the epididymis, which could potentially be infectious. Recommend follow-up ultrasound after treatment. Electronically Signed   By: Maurine Simmering M.D.   On: 04/27/2021 13:49    Procedures Procedures   Medications Ordered in ED Medications - No data to display  ED Course  I have reviewed the triage vital signs and the nursing notes.  Pertinent labs & imaging results that were available during my care of the patient were reviewed by me and considered in my medical decision making (see chart for details).    MDM Rules/Calculators/A&P                         Patient with hypokalemia here and treated with oral potassium.  Will need to have this repeated by his doctor. Patient is urinalysis shows infection.  Ultrasound shows evidence of hydrocele.  Patient seen by urology who recommends patient be placed on Levaquin and follow-up with him in the office    Final Clinical Impression(s) / ED Diagnoses Final diagnoses:  None    Rx / DC Orders ED Discharge Orders     None        Lacretia Leigh, MD 04/27/21 [phone]

## 2021-04-27 NOTE — Consult Note (Signed)
Urology Consult    Reason for consult: Testicular swelling and pain  History of Present Illness: John Austin is a 80 y.o. who presented to the ED today complaining of 2 weeks of worsening L testicular pain and swelling  An ultrasound was obtained in the ED today.  I reviewed the images and appears to be a small left hydrocele and some epididymal inflammation on that side as well.  He denies a history of voiding or storage urinary symptoms, hematuria, STDs, urolithiasis, GU malignancy/trauma/surgery.  Past Medical History:  Diagnosis Date   Arthritis    Atypical mole 07/29/2005   atypical on mid upper back - excision   Atypical mole 02/01/2011   atypical on right forearm - excision   Atypical mole 12/01/2017   mild atypia on left deltoid   Atypical mole 03/17/2018   mild atypia on left upper arm   Atypical mole 09/20/2019   Right Upper Back (Severe)   Basal cell carcinoma 10/11/2018   left shoulder - CX3+cautery+5FU   Cancer (HCC)    hx of skin cancer    Complication of anesthesia    Essential hypertension    GERD (gastroesophageal reflux disease)    History of hiatal hernia    Melanoma ([facility]) 11/15/2017   Level III on left deltoid - excision   PONV (postoperative nausea and vomiting)    per pt "slow to wake, difficult to put to sleep"    Pre-diabetes    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   well differentiated on anterior crown of scalp - tx p bx   Squamous cell carcinoma of skin 10/13/2015   in situ on left scalp - CX3+5FU   Squamous cell carcinoma of skin 02/18/2016   in situ on mid front scalp - tx p bx   Squamous cell carcinoma of skin 05/31/2016   well differentiated on left forearm - tx p bx   Squamous cell carcinoma of skin 05/18/2020   well diff- left forearm sup(EXC)   Squamous cell carcinoma of skin 05/18/2020   in situ-left forearm inf    Past Surgical History:  Procedure Laterality Date   BILATERAL SHOULDER SURGERY     COLONOSCOPY     EYE  SURGERY     cataract extractions    eyebrow surgery      RIGHT FOOT SURGERY     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   Current Outpatient Medications on File Prior to Encounter  Medication Sig Dispense Refill   azelastine (OPTIVAR) 0.05 % ophthalmic solution Place 1 drop into both eyes 2 (two) times daily.     CARTIA XT 180 MG 24 hr capsule Take 180 mg by mouth daily.     celecoxib (CELEBREX) 200 MG capsule TAKE 1 CAPSULE BY MOUTH TWICE DAILY AFTER A MEAL     Cinnamon 500 MG TABS Take 500 mg by mouth daily.     clobetasol (TEMOVATE) 0.05 % external solution Apply 1 application topically daily. 50 mL 10   diltiazem (TIAZAC) 180 MG 24 hr capsule Take 180 mg  by mouth every evening.     docusate sodium (COLACE) 100 MG capsule Take 100-200 mg by mouth daily as needed (constipation.).      doxycycline (VIBRAMYCIN) 50 MG capsule Take 50 mg by mouth 2 (two) times daily.     Doxycycline Hyclate 50 MG TABS Take 1 tablet by mouth twice daily 180 tablet 0   EPINEPHrine 0.3 mg/0.3 mL IJ SOAJ injection USE AS DIRECTED AS NEEDED FOR SYSTEMIC REACTION     EQ ASPIRIN 325 MG tablet TAKE 1 TABLET BY MOUTH TWICE DAILY FOR 20 DAYS THEN RESUME ONE 81MG  ASPIRIN ONCE A DAY     fexofenadine (ALLEGRA) 180 MG tablet Take 180 mg by mouth daily as needed for allergies.      fluticasone (FLONASE) 50 MCG/ACT nasal spray Place 1-2 sprays into both nostrils at bedtime. Allergies.     gabapentin (NEURONTIN) 100 MG capsule Take 2 capsules (200 mg total) by mouth 3  (three) times daily. Take 200 mg three times a day for two weeks following surgery.Then take 200 mg two times a day for two weeks. Then take 200 mg once a day for two weeks. Then discontinue. 168 capsule 0   ketoconazole (NIZORAL) 2 % shampoo Apply 1 application topically 3 (three) times a week.      meclizine (ANTIVERT) 25 MG tablet Take 12.5-25 mg by mouth daily as needed (vertigo). dizziness     methocarbamol (ROBAXIN) 500 MG tablet Take 1 tablet (500 mg total) by mouth every 6 (six) hours as needed for muscle spasms. 40 tablet 0   metroNIDAZOLE (METROCREAM) 0.75 % cream Apply 1 application topically daily.      Molnupiravir 200 MG CAPS TAKE 4 CAPSULES BY MOUTH EVERY 12 HOURS FOR 5 DAYS 40 capsule 0   Multiple Vitamin (MULTIVITAMIN WITH MINERALS) TABS tablet Take 1 tablet by mouth daily.     Omega-3 Fatty Acids (FISH OIL PO) Take 1 capsule by mouth daily.     oxyCODONE (OXY IR/ROXICODONE) 5 MG immediate release tablet Take 1-2 tablets (5-10 mg total) by mouth every 6 (six) hours as needed for severe pain. 56 tablet 0   traMADol (ULTRAM) 50 MG tablet Take 1-2 tablets (50-100 mg total) by mouth every 6 (six) hours as needed for moderate pain. 40 tablet 0   triamcinolone (KENALOG) 0.1 % Apply 1 application topically daily as needed (rosacea). 60 g 2     Scheduled Meds:  potassium chloride  60 mEq Oral Once   Continuous Infusions: PRN Meds:.  Allergies: No Known Allergies  History reviewed. No pertinent family history.  Social History:  reports that he has quit smoking. His smokeless tobacco use includes chew. He reports that he does not drink alcohol and does not use drugs.  ROS: A complete review of systems was performed.  All systems are negative except for pertinent findings as noted.  Physical Exam:  Vital signs in last 24 hours: Temp:  [97.9 F (36.6 C)] 97.9 F (36.6 C) (12/20 1137) Pulse Rate:  [81-85] 81 (12/20 1334) Resp:  [17-18] 17 (12/20 1334) BP: (131-142)/(72-75)  142/72 (12/20 1334) SpO2:  [95 %-98 %] 98 % (12/20 1334) Weight:  [90.7 kg] 90.7 kg (12/20 1140) Constitutional:  Alert and oriented, No acute distress Cardiovascular: Regular rate and rhythm Respiratory: Normal respiratory effort, Lungs clear bilaterally GI: Abdomen is soft, nontender, nondistended, no abdominal masses GU: Scrotum is diffusely erythematous.  Left hemiscrotum firm, approximately fist sized, and tender to palpation.  Right testis approximate 18 cc  in soft Neurologic: Grossly intact, no focal deficits Psychiatric: Normal mood and affect  Laboratory Data:  Recent Labs    04/27/21 1232  WBC 13.4*  HGB 13.8  HCT 40.6  PLT 244    Recent Labs    04/27/21 1232  NA 133*  K 2.6*  CL 97*  GLUCOSE 121*  BUN 9  CALCIUM 8.6*  CREATININE 0.75     Results for orders placed or performed during the hospital encounter of 04/27/21 (from the past 24 hour(s))  CBC with Differential     Status: Abnormal   Collection Time: 04/27/21 12:32 PM  Result Value Ref Range   WBC 13.4 (H) 4.0 - 10.5 K/uL   RBC 4.76 4.22 - 5.81 MIL/uL   Hemoglobin 13.8 13.0 - 17.0 g/dL   HCT 40.6 39.0 - 52.0 %   MCV 85.3 80.0 - 100.0 fL   MCH 29.0 26.0 - 34.0 pg   MCHC 34.0 30.0 - 36.0 g/dL   RDW 14.2 11.5 - 15.5 %   Platelets 244 150 - 400 K/uL   nRBC 0.0 0.0 - 0.2 %   Neutrophils Relative % 78 %   Neutro Abs 10.5 (H) 1.7 - 7.7 K/uL   Lymphocytes Relative 10 %   Lymphs Abs 1.3 0.7 - 4.0 K/uL   Monocytes Relative 11 %   Monocytes Absolute 1.4 (H) 0.1 - 1.0 K/uL   Eosinophils Relative 1 %   Eosinophils Absolute 0.1 0.0 - 0.5 K/uL   Basophils Relative 0 %   Basophils Absolute 0.0 0.0 - 0.1 K/uL   Immature Granulocytes 0 %   Abs Immature Granulocytes 0.06 0.00 - 0.07 K/uL  Comprehensive metabolic panel     Status: Abnormal   Collection Time: 04/27/21 12:32 PM  Result Value Ref Range   Sodium 133 (L) 135 - 145 mmol/L   Potassium 2.6 (LL) 3.5 - 5.1 mmol/L   Chloride 97 (L) 98 - 111 mmol/L    CO2 26 22 - 32 mmol/L   Glucose, Bld 121 (H) 70 - 99 mg/dL   BUN 9 8 - 23 mg/dL   Creatinine, Ser 0.75 0.61 - 1.24 mg/dL   Calcium 8.6 (L) 8.9 - 10.3 mg/dL   Total Protein 6.7 6.5 - 8.1 g/dL   Albumin 3.5 3.5 - 5.0 g/dL   AST 19 15 - 41 U/L   ALT 13 0 - 44 U/L   Alkaline Phosphatase 61 38 - 126 U/L   Total Bilirubin 1.8 (H) 0.3 - 1.2 mg/dL   GFR, Estimated >60 >60 mL/min   Anion gap 10 5 - 15  Resp Panel by RT-PCR (Flu A&B, Covid) Nasopharyngeal Swab     Status: None   Collection Time: 04/27/21 12:32 PM   Specimen: Nasopharyngeal Swab; Nasopharyngeal(NP) swabs in vial transport medium  Result Value Ref Range   SARS Coronavirus 2 by RT PCR NEGATIVE NEGATIVE   Influenza A by PCR NEGATIVE NEGATIVE   Influenza B by PCR NEGATIVE NEGATIVE   Recent Results (from the past 240 hour(s))  Resp Panel by RT-PCR (Flu A&B, Covid) Nasopharyngeal Swab     Status: None   Collection Time: 04/27/21 12:32 PM   Specimen: Nasopharyngeal Swab; Nasopharyngeal(NP) swabs in vial transport medium  Result Value Ref Range Status   SARS Coronavirus 2 by RT PCR NEGATIVE NEGATIVE Final    Comment: (NOTE) SARS-CoV-2 target nucleic acids are NOT DETECTED.  The SARS-CoV-2 RNA is generally detectable in upper respiratory specimens during the acute phase of infection.  The lowest concentration of SARS-CoV-2 viral copies this assay can detect is 138 copies/mL. A negative result does not preclude SARS-Cov-2 infection and should not be used as the sole basis for treatment or other patient management decisions. A negative result may occur with  improper specimen collection/handling, submission of specimen other than nasopharyngeal swab, presence of viral mutation(s) within the areas targeted by this assay, and inadequate number of viral copies(<138 copies/mL). A negative result must be combined with clinical observations, patient history, and epidemiological information. The expected result is Negative.  Fact Sheet  for Patients:  EntrepreneurPulse.com.au  Fact Sheet for Healthcare Providers:  IncredibleEmployment.be  This test is no t yet approved or cleared by the [place] FDA and  has been authorized for detection and/or diagnosis of SARS-CoV-2 by FDA under an Emergency Use Authorization (EUA). This EUA will remain  in effect (meaning this test can be used) for the duration of the COVID-19 declaration under Section 564(b)(1) of the Act, 21 U.S.C.section 360bbb-3(b)(1), unless the authorization is terminated  or revoked sooner.       Influenza A by PCR NEGATIVE NEGATIVE Final   Influenza B by PCR NEGATIVE NEGATIVE Final    Comment: (NOTE) The Xpert Xpress SARS-CoV-2/FLU/RSV plus assay is intended as an aid in the diagnosis of influenza from Nasopharyngeal swab specimens and should not be used as a sole basis for treatment. Nasal washings and aspirates are unacceptable for Xpert Xpress SARS-CoV-2/FLU/RSV testing.  Fact Sheet for Patients: EntrepreneurPulse.com.au  Fact Sheet for Healthcare Providers: IncredibleEmployment.be  This test is not yet approved or cleared by the [place] FDA and has been authorized for detection and/or diagnosis of SARS-CoV-2 by FDA under an Emergency Use Authorization (EUA). This EUA will remain in effect (meaning this test can be used) for the duration of the COVID-19 declaration under Section 564(b)(1) of the Act, 21 U.S.C. section 360bbb-3(b)(1), unless the authorization is terminated or revoked.  Performed at [facility], [place] [address], [place], [place] [address]     Renal Function: Recent Labs    04/27/21 1232  CREATININE 0.75   Estimated Creatinine Clearance: 84.9 mL/min (by C-G formula based on SCr of 0.75 mg/dL).  Radiologic Imaging: US SCROTUM W/DOPPLER  Result Date: 04/27/2021 CLINICAL DATA:  Scrotal pain EXAM: SCROTAL ULTRASOUND  DOPPLER ULTRASOUND OF THE TESTICLES TECHNIQUE: Complete ultrasound examination of the testicles, epididymis, and other scrotal structures was performed. Color and spectral Doppler ultrasound were also utilized to evaluate blood flow to the testicles. COMPARISON:  None. FINDINGS: Right testicle Measurements: 4.9 x 3.7 x 2.9 cm. No mass or microlithiasis visualized. Left testicle Measurements: 6.0 x 3.8 x 4.4 cm. Mildly heterogeneous and hypervascular in comparison to the right testicle. Right epididymis:  Normal in size and appearance. Left epididymis: Mildly enlarged with mildly increased vascularity. Small complex cystic area measuring 7 x 8 mm. Hydrocele:  Moderate-sized left-sided hydrocele. Varicocele:  None visualized. Pulsed Doppler interrogation of both testes demonstrates normal low resistance arterial and venous waveforms bilaterally. IMPRESSION: Findings are suggestive of left-sided epididymo-orchitis. Moderate reactive left-sided hydrocele. Small, 8 mm complex cystic lesion in the epididymis, which could potentially be infectious. Recommend follow-up ultrasound after treatment. Electronically Signed   By: Maurine Simmering M.D.   On: 04/27/2021 13:49    I independently reviewed the above imaging studies.  Impression/Recommendation Left epididymoorchitis - recommended a 2-week course of Levaquin, and to check a urinalysis and urine culture.  I also discussed conservative measures with the patient and his wife  for pain control, including elevation, ice packs, and anti-inflammatories.  The patient reports that he has had multiple urinary tract infections in the past we discussed it may be worthwhile for him to establish care in our office.  Donald Pore MD 04/27/2021, 2:07 PM  [facility]  Pager: [phone]

## 2021-04-27 NOTE — ED Triage Notes (Signed)
Patient sent from [facility] MD for scrotal swelling and pain x 2 weeks and progressed over the past weekend. States there is swelling, tenderness and redness to scrotum, worse on the left testes.

## 2021-04-27 NOTE — Discharge Instructions (Addendum)
You need to have your potassium repeated next week as it was low today.  Follow-up with the urologist

## 2021-04-27 NOTE — ED Notes (Signed)
Urologist at bedside.

## 2021-04-27 NOTE — ED Provider Notes (Signed)
Emergency Medicine Provider Triage Evaluation Note  John Austin , a 80 y.o. male  was evaluated in triage.  Pt complains of testicle pain and swelling.  He has had progressive worsening for the past 2 weeks.  He complains of redness and significant swelling.  No history of kidney or liver disease.  Patient is not diabetic.Marland Kitchen  Review of Systems  Positive: Scrotum swelling and redness Negative: Fever or chills urinary symptoms  Physical Exam  BP 131/75 (BP Location: Left Arm)    Pulse 85    Temp 97.9 F (36.6 C) (Oral)    Resp 18    Ht 5\' 11"  (1.803 m)    Wt 90.7 kg    SpO2 95%    BMI 27.89 kg/m  Gen:   Awake, no distress   Resp:  Normal effort  MSK:   Moves extremities without difficulty  Other:  Marked swelling of the scrotum and penis with erythema and exquisite tenderness bilateral  Medical Decision Making  Medically screening exam initiated at 12:17 PM.  Appropriate orders placed.  John Austin was informed that the remainder of the evaluation will be completed by another provider, this initial triage assessment does not replace that evaluation, and the importance of remaining in the ED until their evaluation is complete.  Patient here with what appears to be scrotal cellulitis.  I placed orders for work   John Austin, John Austin 04/27/21 1219    John Leigh, MD 04/28/21 1705

## 2021-04-28 DIAGNOSIS — Z20828 Contact with and (suspected) exposure to other viral communicable diseases: Secondary | ICD-10-CM | POA: Diagnosis not present

## 2021-05-11 DIAGNOSIS — Z20828 Contact with and (suspected) exposure to other viral communicable diseases: Secondary | ICD-10-CM | POA: Diagnosis not present

## 2021-05-11 DIAGNOSIS — Z1152 Encounter for screening for COVID-19: Secondary | ICD-10-CM | POA: Diagnosis not present

## 2021-05-14 DIAGNOSIS — R311 Benign essential microscopic hematuria: Secondary | ICD-10-CM | POA: Diagnosis not present

## 2021-05-14 DIAGNOSIS — M150 Primary generalized (osteo)arthritis: Secondary | ICD-10-CM | POA: Diagnosis not present

## 2021-05-14 DIAGNOSIS — I10 Essential (primary) hypertension: Secondary | ICD-10-CM | POA: Diagnosis not present

## 2021-05-14 DIAGNOSIS — K5730 Diverticulosis of large intestine without perforation or abscess without bleeding: Secondary | ICD-10-CM | POA: Diagnosis not present

## 2021-05-18 DIAGNOSIS — R7303 Prediabetes: Secondary | ICD-10-CM | POA: Diagnosis not present

## 2021-05-18 DIAGNOSIS — M150 Primary generalized (osteo)arthritis: Secondary | ICD-10-CM | POA: Diagnosis not present

## 2021-05-18 DIAGNOSIS — I10 Essential (primary) hypertension: Secondary | ICD-10-CM | POA: Diagnosis not present

## 2021-05-18 DIAGNOSIS — N5089 Other specified disorders of the male genital organs: Secondary | ICD-10-CM | POA: Diagnosis not present

## 2021-05-19 DIAGNOSIS — Z20822 Contact with and (suspected) exposure to covid-19: Secondary | ICD-10-CM | POA: Diagnosis not present

## 2021-05-26 DIAGNOSIS — Z20828 Contact with and (suspected) exposure to other viral communicable diseases: Secondary | ICD-10-CM | POA: Diagnosis not present

## 2021-05-28 DIAGNOSIS — N453 Epididymo-orchitis: Secondary | ICD-10-CM | POA: Diagnosis not present

## 2021-05-28 DIAGNOSIS — R350 Frequency of micturition: Secondary | ICD-10-CM | POA: Diagnosis not present

## 2021-05-28 DIAGNOSIS — N401 Enlarged prostate with lower urinary tract symptoms: Secondary | ICD-10-CM | POA: Diagnosis not present

## 2021-05-28 DIAGNOSIS — R8271 Bacteriuria: Secondary | ICD-10-CM | POA: Diagnosis not present

## 2021-05-28 DIAGNOSIS — N3941 Urge incontinence: Secondary | ICD-10-CM | POA: Diagnosis not present

## 2021-05-28 DIAGNOSIS — R3915 Urgency of urination: Secondary | ICD-10-CM | POA: Diagnosis not present

## 2021-06-17 DIAGNOSIS — Z20822 Contact with and (suspected) exposure to covid-19: Secondary | ICD-10-CM | POA: Diagnosis not present

## 2021-06-22 ENCOUNTER — Ambulatory Visit: Payer: Medicare Other | Admitting: Physician Assistant

## 2021-06-22 DIAGNOSIS — I70203 Unspecified atherosclerosis of native arteries of extremities, bilateral legs: Secondary | ICD-10-CM | POA: Diagnosis not present

## 2021-06-22 DIAGNOSIS — M79676 Pain in unspecified toe(s): Secondary | ICD-10-CM | POA: Diagnosis not present

## 2021-06-22 DIAGNOSIS — L84 Corns and callosities: Secondary | ICD-10-CM | POA: Diagnosis not present

## 2021-06-22 DIAGNOSIS — B351 Tinea unguium: Secondary | ICD-10-CM | POA: Diagnosis not present

## 2021-06-29 DIAGNOSIS — Z20822 Contact with and (suspected) exposure to covid-19: Secondary | ICD-10-CM | POA: Diagnosis not present

## 2021-06-30 DIAGNOSIS — W19XXXD Unspecified fall, subsequent encounter: Secondary | ICD-10-CM | POA: Diagnosis not present

## 2021-06-30 DIAGNOSIS — R531 Weakness: Secondary | ICD-10-CM | POA: Diagnosis not present

## 2021-07-16 DIAGNOSIS — Z20822 Contact with and (suspected) exposure to covid-19: Secondary | ICD-10-CM | POA: Diagnosis not present

## 2021-07-21 DIAGNOSIS — Z20822 Contact with and (suspected) exposure to covid-19: Secondary | ICD-10-CM | POA: Diagnosis not present

## 2021-07-28 DIAGNOSIS — Z20822 Contact with and (suspected) exposure to covid-19: Secondary | ICD-10-CM | POA: Diagnosis not present

## 2021-07-30 DIAGNOSIS — Z20822 Contact with and (suspected) exposure to covid-19: Secondary | ICD-10-CM | POA: Diagnosis not present

## 2021-08-02 DIAGNOSIS — Z20822 Contact with and (suspected) exposure to covid-19: Secondary | ICD-10-CM | POA: Diagnosis not present

## 2021-08-03 DIAGNOSIS — N411 Chronic prostatitis: Secondary | ICD-10-CM | POA: Diagnosis not present

## 2021-08-03 DIAGNOSIS — N3941 Urge incontinence: Secondary | ICD-10-CM | POA: Diagnosis not present

## 2021-08-04 DIAGNOSIS — Z20822 Contact with and (suspected) exposure to covid-19: Secondary | ICD-10-CM | POA: Diagnosis not present

## 2021-08-05 DIAGNOSIS — L97511 Non-pressure chronic ulcer of other part of right foot limited to breakdown of skin: Secondary | ICD-10-CM | POA: Diagnosis not present

## 2021-08-09 DIAGNOSIS — Z20822 Contact with and (suspected) exposure to covid-19: Secondary | ICD-10-CM | POA: Diagnosis not present

## 2021-08-13 DIAGNOSIS — Z20822 Contact with and (suspected) exposure to covid-19: Secondary | ICD-10-CM | POA: Diagnosis not present

## 2021-08-24 DIAGNOSIS — Z20822 Contact with and (suspected) exposure to covid-19: Secondary | ICD-10-CM | POA: Diagnosis not present

## 2021-08-26 DIAGNOSIS — Z20822 Contact with and (suspected) exposure to covid-19: Secondary | ICD-10-CM | POA: Diagnosis not present

## 2021-08-26 DIAGNOSIS — L97511 Non-pressure chronic ulcer of other part of right foot limited to breakdown of skin: Secondary | ICD-10-CM | POA: Diagnosis not present

## 2021-08-30 DIAGNOSIS — Z20822 Contact with and (suspected) exposure to covid-19: Secondary | ICD-10-CM | POA: Diagnosis not present

## 2021-09-06 DIAGNOSIS — Z20822 Contact with and (suspected) exposure to covid-19: Secondary | ICD-10-CM | POA: Diagnosis not present

## 2021-09-08 DIAGNOSIS — Z20822 Contact with and (suspected) exposure to covid-19: Secondary | ICD-10-CM | POA: Diagnosis not present

## 2021-09-10 DIAGNOSIS — Z20822 Contact with and (suspected) exposure to covid-19: Secondary | ICD-10-CM | POA: Diagnosis not present

## 2021-09-13 DIAGNOSIS — Z20822 Contact with and (suspected) exposure to covid-19: Secondary | ICD-10-CM | POA: Diagnosis not present

## 2021-09-14 DIAGNOSIS — R350 Frequency of micturition: Secondary | ICD-10-CM | POA: Diagnosis not present

## 2021-09-14 DIAGNOSIS — N3941 Urge incontinence: Secondary | ICD-10-CM | POA: Diagnosis not present

## 2021-09-14 DIAGNOSIS — N401 Enlarged prostate with lower urinary tract symptoms: Secondary | ICD-10-CM | POA: Diagnosis not present

## 2021-09-14 DIAGNOSIS — R3915 Urgency of urination: Secondary | ICD-10-CM | POA: Diagnosis not present

## 2021-09-16 DIAGNOSIS — Z20822 Contact with and (suspected) exposure to covid-19: Secondary | ICD-10-CM | POA: Diagnosis not present

## 2021-09-16 DIAGNOSIS — L03121 Acute lymphangitis of right axilla: Secondary | ICD-10-CM | POA: Diagnosis not present

## 2021-09-16 DIAGNOSIS — M79673 Pain in unspecified foot: Secondary | ICD-10-CM | POA: Diagnosis not present

## 2021-09-16 DIAGNOSIS — L03122 Acute lymphangitis of left axilla: Secondary | ICD-10-CM | POA: Diagnosis not present

## 2021-09-29 DIAGNOSIS — Z20828 Contact with and (suspected) exposure to other viral communicable diseases: Secondary | ICD-10-CM | POA: Diagnosis not present

## 2021-10-05 DIAGNOSIS — Z20828 Contact with and (suspected) exposure to other viral communicable diseases: Secondary | ICD-10-CM | POA: Diagnosis not present

## 2021-10-14 DIAGNOSIS — L97511 Non-pressure chronic ulcer of other part of right foot limited to breakdown of skin: Secondary | ICD-10-CM | POA: Diagnosis not present

## 2021-10-19 DIAGNOSIS — M150 Primary generalized (osteo)arthritis: Secondary | ICD-10-CM | POA: Diagnosis not present

## 2021-10-19 DIAGNOSIS — I10 Essential (primary) hypertension: Secondary | ICD-10-CM | POA: Diagnosis not present

## 2021-10-19 DIAGNOSIS — R5383 Other fatigue: Secondary | ICD-10-CM | POA: Diagnosis not present

## 2021-10-19 DIAGNOSIS — R7303 Prediabetes: Secondary | ICD-10-CM | POA: Diagnosis not present

## 2021-10-28 DIAGNOSIS — R350 Frequency of micturition: Secondary | ICD-10-CM | POA: Diagnosis not present

## 2021-10-28 DIAGNOSIS — R3915 Urgency of urination: Secondary | ICD-10-CM | POA: Diagnosis not present

## 2021-11-16 DIAGNOSIS — M549 Dorsalgia, unspecified: Secondary | ICD-10-CM | POA: Diagnosis not present

## 2021-11-17 ENCOUNTER — Other Ambulatory Visit (HOSPITAL_BASED_OUTPATIENT_CLINIC_OR_DEPARTMENT_OTHER): Payer: Self-pay

## 2021-11-30 DIAGNOSIS — R7303 Prediabetes: Secondary | ICD-10-CM | POA: Diagnosis not present

## 2021-11-30 DIAGNOSIS — D692 Other nonthrombocytopenic purpura: Secondary | ICD-10-CM | POA: Diagnosis not present

## 2021-11-30 DIAGNOSIS — R311 Benign essential microscopic hematuria: Secondary | ICD-10-CM | POA: Diagnosis not present

## 2021-11-30 DIAGNOSIS — R269 Unspecified abnormalities of gait and mobility: Secondary | ICD-10-CM | POA: Diagnosis not present

## 2021-11-30 DIAGNOSIS — N182 Chronic kidney disease, stage 2 (mild): Secondary | ICD-10-CM | POA: Diagnosis not present

## 2021-11-30 DIAGNOSIS — M150 Primary generalized (osteo)arthritis: Secondary | ICD-10-CM | POA: Diagnosis not present

## 2021-11-30 DIAGNOSIS — Z0000 Encounter for general adult medical examination without abnormal findings: Secondary | ICD-10-CM | POA: Diagnosis not present

## 2021-11-30 DIAGNOSIS — I10 Essential (primary) hypertension: Secondary | ICD-10-CM | POA: Diagnosis not present

## 2021-11-30 DIAGNOSIS — R600 Localized edema: Secondary | ICD-10-CM | POA: Diagnosis not present

## 2021-12-07 DIAGNOSIS — L03121 Acute lymphangitis of right axilla: Secondary | ICD-10-CM | POA: Diagnosis not present

## 2021-12-07 DIAGNOSIS — L03122 Acute lymphangitis of left axilla: Secondary | ICD-10-CM | POA: Diagnosis not present

## 2021-12-07 DIAGNOSIS — R601 Generalized edema: Secondary | ICD-10-CM | POA: Diagnosis not present

## 2021-12-21 DIAGNOSIS — L97511 Non-pressure chronic ulcer of other part of right foot limited to breakdown of skin: Secondary | ICD-10-CM | POA: Diagnosis not present

## 2021-12-24 DIAGNOSIS — N481 Balanitis: Secondary | ICD-10-CM | POA: Diagnosis not present

## 2022-01-06 DIAGNOSIS — L97511 Non-pressure chronic ulcer of other part of right foot limited to breakdown of skin: Secondary | ICD-10-CM | POA: Diagnosis not present

## 2022-01-21 DIAGNOSIS — R350 Frequency of micturition: Secondary | ICD-10-CM | POA: Diagnosis not present

## 2022-01-21 DIAGNOSIS — N3001 Acute cystitis with hematuria: Secondary | ICD-10-CM | POA: Diagnosis not present

## 2022-01-21 DIAGNOSIS — R300 Dysuria: Secondary | ICD-10-CM | POA: Diagnosis not present

## 2022-03-10 DIAGNOSIS — N3941 Urge incontinence: Secondary | ICD-10-CM | POA: Diagnosis not present

## 2022-03-10 DIAGNOSIS — R3914 Feeling of incomplete bladder emptying: Secondary | ICD-10-CM | POA: Diagnosis not present

## 2022-03-15 DIAGNOSIS — L97511 Non-pressure chronic ulcer of other part of right foot limited to breakdown of skin: Secondary | ICD-10-CM | POA: Diagnosis not present

## 2022-03-23 DIAGNOSIS — R269 Unspecified abnormalities of gait and mobility: Secondary | ICD-10-CM | POA: Diagnosis not present

## 2022-03-23 DIAGNOSIS — M150 Primary generalized (osteo)arthritis: Secondary | ICD-10-CM | POA: Diagnosis not present

## 2022-03-23 DIAGNOSIS — Z23 Encounter for immunization: Secondary | ICD-10-CM | POA: Diagnosis not present

## 2022-03-24 DIAGNOSIS — R7303 Prediabetes: Secondary | ICD-10-CM | POA: Diagnosis not present

## 2022-03-24 DIAGNOSIS — K5790 Diverticulosis of intestine, part unspecified, without perforation or abscess without bleeding: Secondary | ICD-10-CM | POA: Diagnosis not present

## 2022-03-24 DIAGNOSIS — D692 Other nonthrombocytopenic purpura: Secondary | ICD-10-CM | POA: Diagnosis not present

## 2022-03-24 DIAGNOSIS — M159 Polyosteoarthritis, unspecified: Secondary | ICD-10-CM | POA: Diagnosis not present

## 2022-03-24 DIAGNOSIS — Z96651 Presence of right artificial knee joint: Secondary | ICD-10-CM | POA: Diagnosis not present

## 2022-03-24 DIAGNOSIS — Z9181 History of falling: Secondary | ICD-10-CM | POA: Diagnosis not present

## 2022-03-24 DIAGNOSIS — Z96652 Presence of left artificial knee joint: Secondary | ICD-10-CM | POA: Diagnosis not present

## 2022-03-24 DIAGNOSIS — N182 Chronic kidney disease, stage 2 (mild): Secondary | ICD-10-CM | POA: Diagnosis not present

## 2022-03-24 DIAGNOSIS — I129 Hypertensive chronic kidney disease with stage 1 through stage 4 chronic kidney disease, or unspecified chronic kidney disease: Secondary | ICD-10-CM | POA: Diagnosis not present

## 2022-03-24 DIAGNOSIS — Z7902 Long term (current) use of antithrombotics/antiplatelets: Secondary | ICD-10-CM | POA: Diagnosis not present

## 2022-03-30 DIAGNOSIS — I129 Hypertensive chronic kidney disease with stage 1 through stage 4 chronic kidney disease, or unspecified chronic kidney disease: Secondary | ICD-10-CM | POA: Diagnosis not present

## 2022-03-30 DIAGNOSIS — K5790 Diverticulosis of intestine, part unspecified, without perforation or abscess without bleeding: Secondary | ICD-10-CM | POA: Diagnosis not present

## 2022-03-30 DIAGNOSIS — R7303 Prediabetes: Secondary | ICD-10-CM | POA: Diagnosis not present

## 2022-03-30 DIAGNOSIS — M159 Polyosteoarthritis, unspecified: Secondary | ICD-10-CM | POA: Diagnosis not present

## 2022-03-30 DIAGNOSIS — D692 Other nonthrombocytopenic purpura: Secondary | ICD-10-CM | POA: Diagnosis not present

## 2022-03-30 DIAGNOSIS — N182 Chronic kidney disease, stage 2 (mild): Secondary | ICD-10-CM | POA: Diagnosis not present

## 2022-04-01 DIAGNOSIS — M159 Polyosteoarthritis, unspecified: Secondary | ICD-10-CM | POA: Diagnosis not present

## 2022-04-01 DIAGNOSIS — K5790 Diverticulosis of intestine, part unspecified, without perforation or abscess without bleeding: Secondary | ICD-10-CM | POA: Diagnosis not present

## 2022-04-01 DIAGNOSIS — R7303 Prediabetes: Secondary | ICD-10-CM | POA: Diagnosis not present

## 2022-04-01 DIAGNOSIS — I129 Hypertensive chronic kidney disease with stage 1 through stage 4 chronic kidney disease, or unspecified chronic kidney disease: Secondary | ICD-10-CM | POA: Diagnosis not present

## 2022-04-01 DIAGNOSIS — D692 Other nonthrombocytopenic purpura: Secondary | ICD-10-CM | POA: Diagnosis not present

## 2022-04-01 DIAGNOSIS — N182 Chronic kidney disease, stage 2 (mild): Secondary | ICD-10-CM | POA: Diagnosis not present

## 2022-04-05 DIAGNOSIS — D692 Other nonthrombocytopenic purpura: Secondary | ICD-10-CM | POA: Diagnosis not present

## 2022-04-05 DIAGNOSIS — M159 Polyosteoarthritis, unspecified: Secondary | ICD-10-CM | POA: Diagnosis not present

## 2022-04-05 DIAGNOSIS — R7303 Prediabetes: Secondary | ICD-10-CM | POA: Diagnosis not present

## 2022-04-05 DIAGNOSIS — N182 Chronic kidney disease, stage 2 (mild): Secondary | ICD-10-CM | POA: Diagnosis not present

## 2022-04-05 DIAGNOSIS — I129 Hypertensive chronic kidney disease with stage 1 through stage 4 chronic kidney disease, or unspecified chronic kidney disease: Secondary | ICD-10-CM | POA: Diagnosis not present

## 2022-04-05 DIAGNOSIS — K5790 Diverticulosis of intestine, part unspecified, without perforation or abscess without bleeding: Secondary | ICD-10-CM | POA: Diagnosis not present

## 2022-04-07 DIAGNOSIS — I129 Hypertensive chronic kidney disease with stage 1 through stage 4 chronic kidney disease, or unspecified chronic kidney disease: Secondary | ICD-10-CM | POA: Diagnosis not present

## 2022-04-07 DIAGNOSIS — M159 Polyosteoarthritis, unspecified: Secondary | ICD-10-CM | POA: Diagnosis not present

## 2022-04-07 DIAGNOSIS — K5790 Diverticulosis of intestine, part unspecified, without perforation or abscess without bleeding: Secondary | ICD-10-CM | POA: Diagnosis not present

## 2022-04-07 DIAGNOSIS — N182 Chronic kidney disease, stage 2 (mild): Secondary | ICD-10-CM | POA: Diagnosis not present

## 2022-04-07 DIAGNOSIS — R7303 Prediabetes: Secondary | ICD-10-CM | POA: Diagnosis not present

## 2022-04-07 DIAGNOSIS — D692 Other nonthrombocytopenic purpura: Secondary | ICD-10-CM | POA: Diagnosis not present

## 2022-04-12 DIAGNOSIS — I129 Hypertensive chronic kidney disease with stage 1 through stage 4 chronic kidney disease, or unspecified chronic kidney disease: Secondary | ICD-10-CM | POA: Diagnosis not present

## 2022-04-12 DIAGNOSIS — N182 Chronic kidney disease, stage 2 (mild): Secondary | ICD-10-CM | POA: Diagnosis not present

## 2022-04-12 DIAGNOSIS — M159 Polyosteoarthritis, unspecified: Secondary | ICD-10-CM | POA: Diagnosis not present

## 2022-04-12 DIAGNOSIS — K5790 Diverticulosis of intestine, part unspecified, without perforation or abscess without bleeding: Secondary | ICD-10-CM | POA: Diagnosis not present

## 2022-04-12 DIAGNOSIS — D692 Other nonthrombocytopenic purpura: Secondary | ICD-10-CM | POA: Diagnosis not present

## 2022-04-12 DIAGNOSIS — R7303 Prediabetes: Secondary | ICD-10-CM | POA: Diagnosis not present

## 2022-04-14 DIAGNOSIS — N182 Chronic kidney disease, stage 2 (mild): Secondary | ICD-10-CM | POA: Diagnosis not present

## 2022-04-14 DIAGNOSIS — K5790 Diverticulosis of intestine, part unspecified, without perforation or abscess without bleeding: Secondary | ICD-10-CM | POA: Diagnosis not present

## 2022-04-14 DIAGNOSIS — M159 Polyosteoarthritis, unspecified: Secondary | ICD-10-CM | POA: Diagnosis not present

## 2022-04-14 DIAGNOSIS — R7303 Prediabetes: Secondary | ICD-10-CM | POA: Diagnosis not present

## 2022-04-14 DIAGNOSIS — D692 Other nonthrombocytopenic purpura: Secondary | ICD-10-CM | POA: Diagnosis not present

## 2022-04-14 DIAGNOSIS — I129 Hypertensive chronic kidney disease with stage 1 through stage 4 chronic kidney disease, or unspecified chronic kidney disease: Secondary | ICD-10-CM | POA: Diagnosis not present

## 2022-04-19 DIAGNOSIS — D692 Other nonthrombocytopenic purpura: Secondary | ICD-10-CM | POA: Diagnosis not present

## 2022-04-19 DIAGNOSIS — N182 Chronic kidney disease, stage 2 (mild): Secondary | ICD-10-CM | POA: Diagnosis not present

## 2022-04-19 DIAGNOSIS — I129 Hypertensive chronic kidney disease with stage 1 through stage 4 chronic kidney disease, or unspecified chronic kidney disease: Secondary | ICD-10-CM | POA: Diagnosis not present

## 2022-04-19 DIAGNOSIS — K5790 Diverticulosis of intestine, part unspecified, without perforation or abscess without bleeding: Secondary | ICD-10-CM | POA: Diagnosis not present

## 2022-04-19 DIAGNOSIS — M159 Polyosteoarthritis, unspecified: Secondary | ICD-10-CM | POA: Diagnosis not present

## 2022-04-19 DIAGNOSIS — R7303 Prediabetes: Secondary | ICD-10-CM | POA: Diagnosis not present

## 2022-04-23 DIAGNOSIS — Z96652 Presence of left artificial knee joint: Secondary | ICD-10-CM | POA: Diagnosis not present

## 2022-04-23 DIAGNOSIS — Z9181 History of falling: Secondary | ICD-10-CM | POA: Diagnosis not present

## 2022-04-23 DIAGNOSIS — K5790 Diverticulosis of intestine, part unspecified, without perforation or abscess without bleeding: Secondary | ICD-10-CM | POA: Diagnosis not present

## 2022-04-23 DIAGNOSIS — N182 Chronic kidney disease, stage 2 (mild): Secondary | ICD-10-CM | POA: Diagnosis not present

## 2022-04-23 DIAGNOSIS — M159 Polyosteoarthritis, unspecified: Secondary | ICD-10-CM | POA: Diagnosis not present

## 2022-04-23 DIAGNOSIS — R7303 Prediabetes: Secondary | ICD-10-CM | POA: Diagnosis not present

## 2022-04-23 DIAGNOSIS — D692 Other nonthrombocytopenic purpura: Secondary | ICD-10-CM | POA: Diagnosis not present

## 2022-04-23 DIAGNOSIS — I129 Hypertensive chronic kidney disease with stage 1 through stage 4 chronic kidney disease, or unspecified chronic kidney disease: Secondary | ICD-10-CM | POA: Diagnosis not present

## 2022-04-23 DIAGNOSIS — Z7902 Long term (current) use of antithrombotics/antiplatelets: Secondary | ICD-10-CM | POA: Diagnosis not present

## 2022-04-23 DIAGNOSIS — Z96651 Presence of right artificial knee joint: Secondary | ICD-10-CM | POA: Diagnosis not present

## 2022-04-26 DIAGNOSIS — K5790 Diverticulosis of intestine, part unspecified, without perforation or abscess without bleeding: Secondary | ICD-10-CM | POA: Diagnosis not present

## 2022-04-26 DIAGNOSIS — N182 Chronic kidney disease, stage 2 (mild): Secondary | ICD-10-CM | POA: Diagnosis not present

## 2022-04-26 DIAGNOSIS — D692 Other nonthrombocytopenic purpura: Secondary | ICD-10-CM | POA: Diagnosis not present

## 2022-04-26 DIAGNOSIS — R7303 Prediabetes: Secondary | ICD-10-CM | POA: Diagnosis not present

## 2022-04-26 DIAGNOSIS — M159 Polyosteoarthritis, unspecified: Secondary | ICD-10-CM | POA: Diagnosis not present

## 2022-04-26 DIAGNOSIS — I129 Hypertensive chronic kidney disease with stage 1 through stage 4 chronic kidney disease, or unspecified chronic kidney disease: Secondary | ICD-10-CM | POA: Diagnosis not present

## 2022-05-04 DIAGNOSIS — N182 Chronic kidney disease, stage 2 (mild): Secondary | ICD-10-CM | POA: Diagnosis not present

## 2022-05-04 DIAGNOSIS — K5790 Diverticulosis of intestine, part unspecified, without perforation or abscess without bleeding: Secondary | ICD-10-CM | POA: Diagnosis not present

## 2022-05-04 DIAGNOSIS — I129 Hypertensive chronic kidney disease with stage 1 through stage 4 chronic kidney disease, or unspecified chronic kidney disease: Secondary | ICD-10-CM | POA: Diagnosis not present

## 2022-05-04 DIAGNOSIS — D692 Other nonthrombocytopenic purpura: Secondary | ICD-10-CM | POA: Diagnosis not present

## 2022-05-04 DIAGNOSIS — R7303 Prediabetes: Secondary | ICD-10-CM | POA: Diagnosis not present

## 2022-05-04 DIAGNOSIS — M159 Polyosteoarthritis, unspecified: Secondary | ICD-10-CM | POA: Diagnosis not present

## 2022-05-31 DIAGNOSIS — N3020 Other chronic cystitis without hematuria: Secondary | ICD-10-CM | POA: Diagnosis not present

## 2022-05-31 DIAGNOSIS — R7303 Prediabetes: Secondary | ICD-10-CM | POA: Diagnosis not present

## 2022-05-31 DIAGNOSIS — Z0000 Encounter for general adult medical examination without abnormal findings: Secondary | ICD-10-CM | POA: Diagnosis not present

## 2022-05-31 DIAGNOSIS — I10 Essential (primary) hypertension: Secondary | ICD-10-CM | POA: Diagnosis not present

## 2022-05-31 DIAGNOSIS — R3914 Feeling of incomplete bladder emptying: Secondary | ICD-10-CM | POA: Diagnosis not present

## 2022-05-31 DIAGNOSIS — N3941 Urge incontinence: Secondary | ICD-10-CM | POA: Diagnosis not present

## 2022-05-31 DIAGNOSIS — N182 Chronic kidney disease, stage 2 (mild): Secondary | ICD-10-CM | POA: Diagnosis not present

## 2022-05-31 DIAGNOSIS — M150 Primary generalized (osteo)arthritis: Secondary | ICD-10-CM | POA: Diagnosis not present

## 2022-05-31 DIAGNOSIS — N401 Enlarged prostate with lower urinary tract symptoms: Secondary | ICD-10-CM | POA: Diagnosis not present

## 2022-06-08 DIAGNOSIS — R7303 Prediabetes: Secondary | ICD-10-CM | POA: Diagnosis not present

## 2022-06-08 DIAGNOSIS — I10 Essential (primary) hypertension: Secondary | ICD-10-CM | POA: Diagnosis not present

## 2022-06-08 DIAGNOSIS — D692 Other nonthrombocytopenic purpura: Secondary | ICD-10-CM | POA: Diagnosis not present

## 2022-06-08 DIAGNOSIS — N182 Chronic kidney disease, stage 2 (mild): Secondary | ICD-10-CM | POA: Diagnosis not present

## 2022-06-08 DIAGNOSIS — R413 Other amnesia: Secondary | ICD-10-CM | POA: Diagnosis not present

## 2022-06-08 DIAGNOSIS — R311 Benign essential microscopic hematuria: Secondary | ICD-10-CM | POA: Diagnosis not present

## 2022-06-08 DIAGNOSIS — R269 Unspecified abnormalities of gait and mobility: Secondary | ICD-10-CM | POA: Diagnosis not present

## 2022-06-08 DIAGNOSIS — M150 Primary generalized (osteo)arthritis: Secondary | ICD-10-CM | POA: Diagnosis not present

## 2022-06-08 DIAGNOSIS — R600 Localized edema: Secondary | ICD-10-CM | POA: Diagnosis not present

## 2022-06-13 DIAGNOSIS — L718 Other rosacea: Secondary | ICD-10-CM | POA: Diagnosis not present

## 2022-06-13 DIAGNOSIS — I872 Venous insufficiency (chronic) (peripheral): Secondary | ICD-10-CM | POA: Diagnosis not present

## 2022-07-06 DIAGNOSIS — D692 Other nonthrombocytopenic purpura: Secondary | ICD-10-CM | POA: Diagnosis not present

## 2022-07-06 DIAGNOSIS — R7303 Prediabetes: Secondary | ICD-10-CM | POA: Diagnosis not present

## 2022-07-06 DIAGNOSIS — R413 Other amnesia: Secondary | ICD-10-CM | POA: Diagnosis not present

## 2022-07-06 DIAGNOSIS — N182 Chronic kidney disease, stage 2 (mild): Secondary | ICD-10-CM | POA: Diagnosis not present

## 2022-07-06 DIAGNOSIS — I10 Essential (primary) hypertension: Secondary | ICD-10-CM | POA: Diagnosis not present

## 2022-07-13 DIAGNOSIS — F02A0 Dementia in other diseases classified elsewhere, mild, without behavioral disturbance, psychotic disturbance, mood disturbance, and anxiety: Secondary | ICD-10-CM | POA: Diagnosis not present

## 2022-07-13 DIAGNOSIS — R600 Localized edema: Secondary | ICD-10-CM | POA: Diagnosis not present

## 2022-07-13 DIAGNOSIS — I10 Essential (primary) hypertension: Secondary | ICD-10-CM | POA: Diagnosis not present

## 2022-08-15 IMAGING — US US SCROTUM W/ DOPPLER COMPLETE
1 series · 13 of 25 positions shown · non-contrast
Comparison: None.

CLINICAL DATA: Scrotal pain

EXAM:
SCROTAL ULTRASOUND
DOPPLER ULTRASOUND OF THE TESTICLES
TECHNIQUE: Complete ultrasound examination of the testicles, epididymis, and
other scrotal structures was performed. Color and spectral Doppler
ultrasound were also utilized to evaluate blood flow to the
testicles.

[Series 1: us scrotum w/ doppler complete · 13 of 65 slices shown]
[im 1/65]
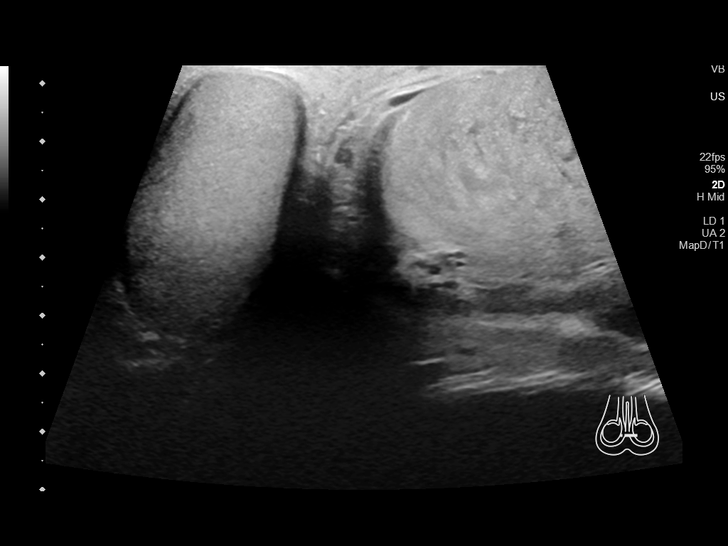
[im 6/65]
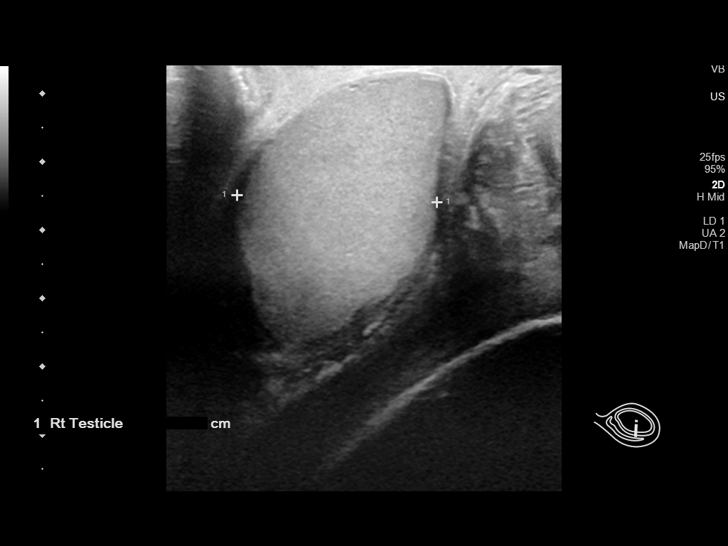
[im 11/65]
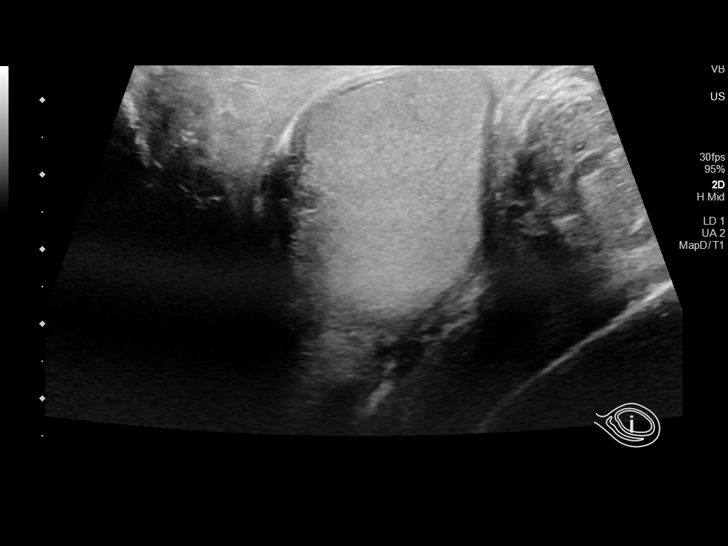
[im 17/65]
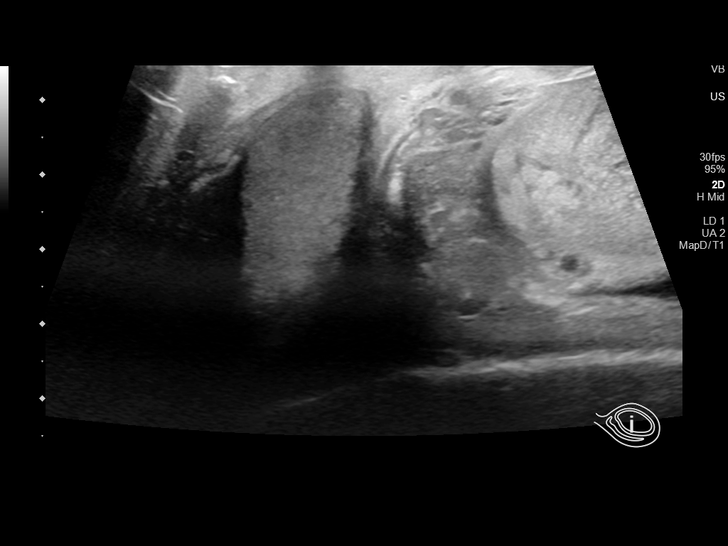
[im 22/65]
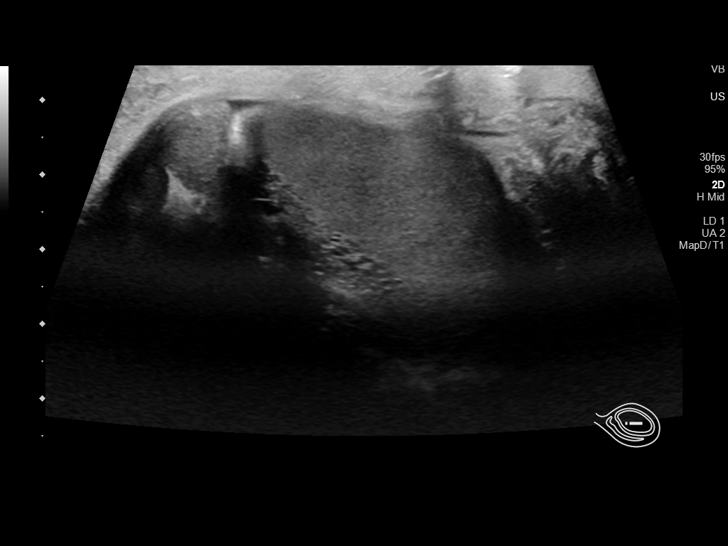
[im 27/65]
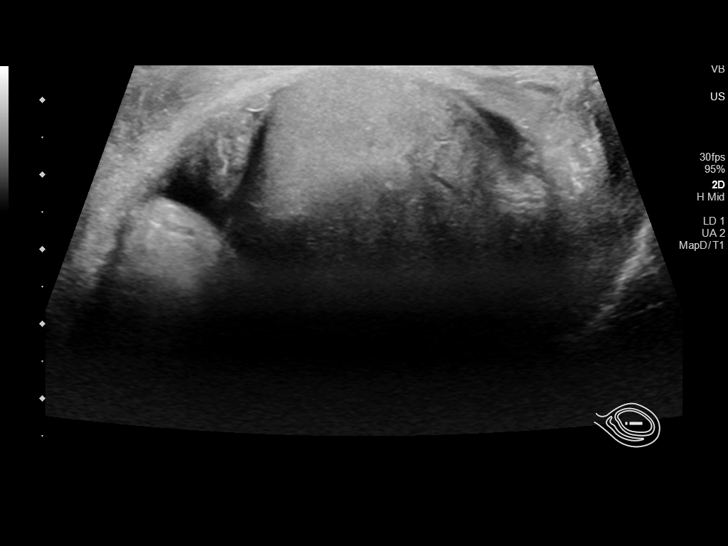
[im 33/65]
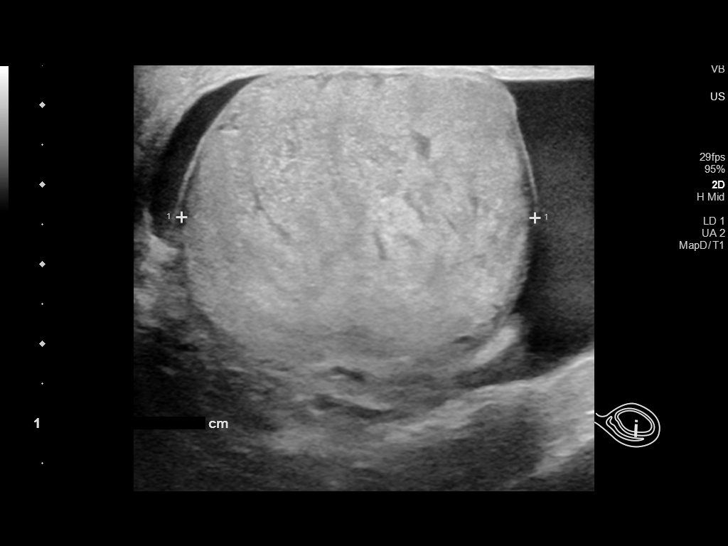
[im 38/65]
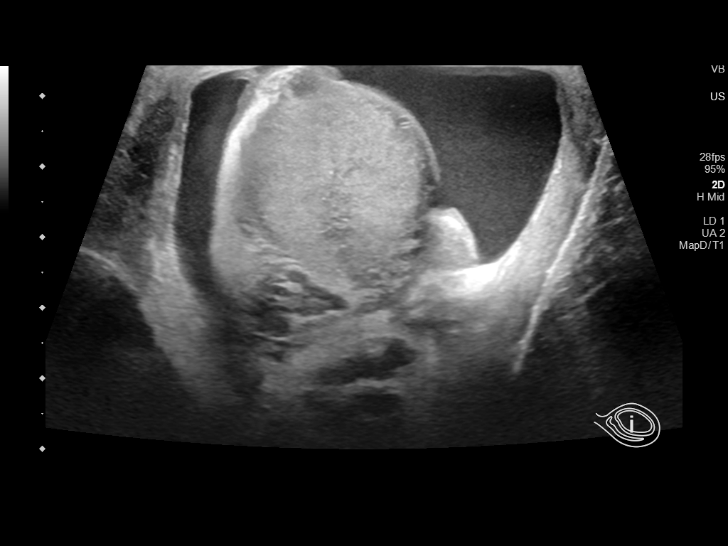
[im 43/65]
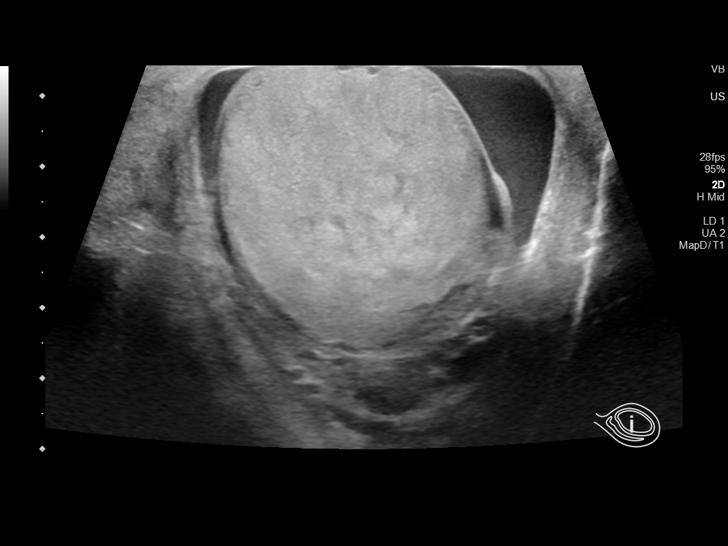
[im 49/65]
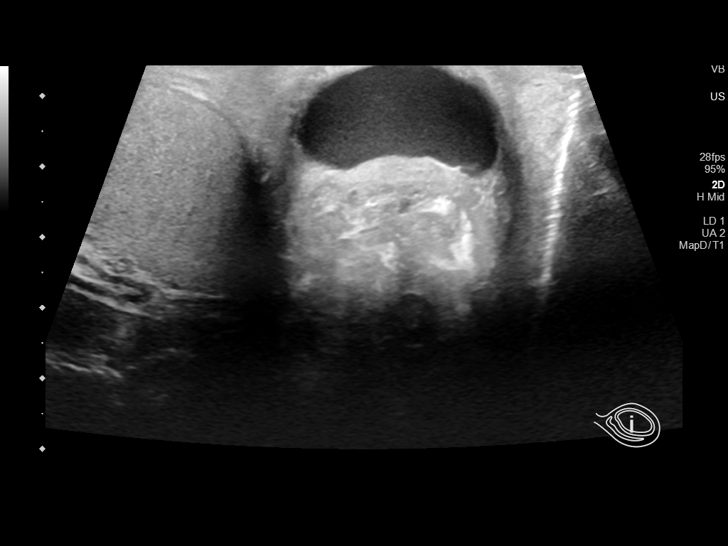
[im 54/65]
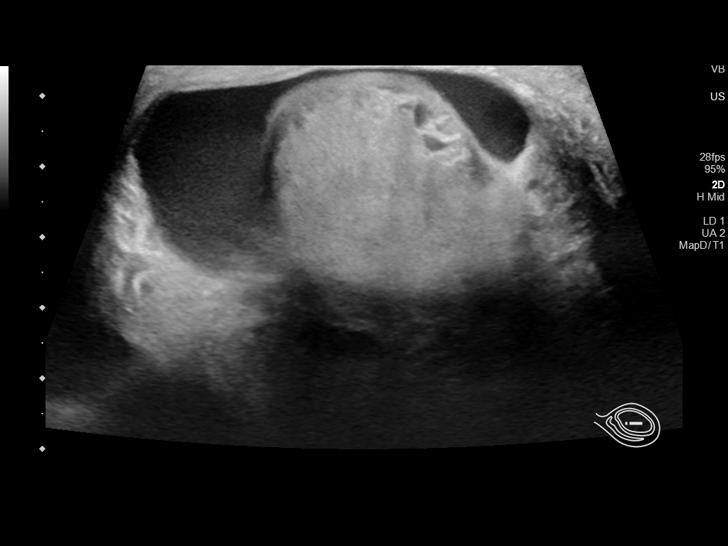
[im 59/65]
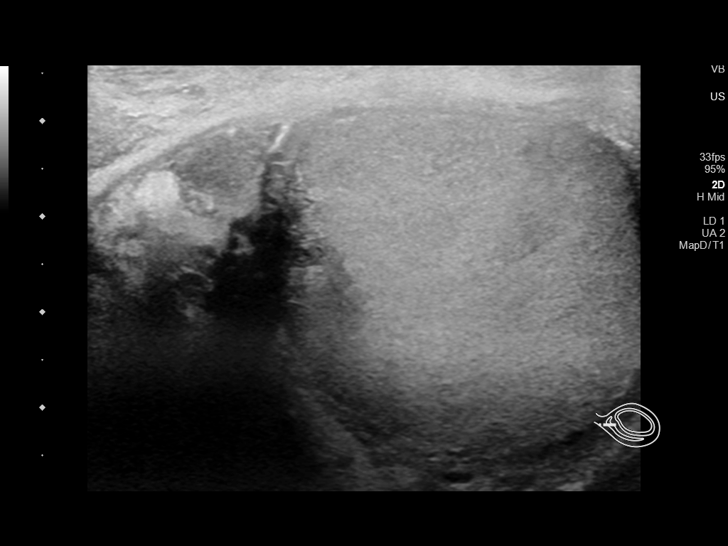
[im 65/65]
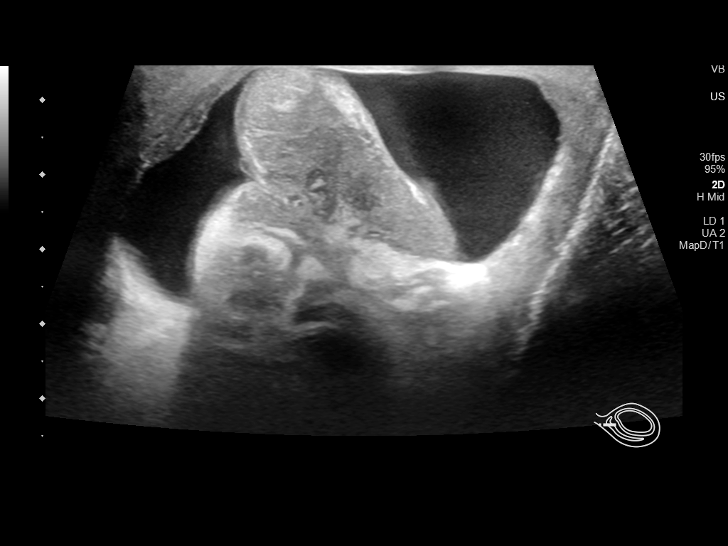

[13 of 25 positions shown; findings below may reference images not displayed]

FINDINGS: Right testicle

Measurements: 4.9 x 3.7 x 2.9 cm. No mass or microlithiasis
visualized.

Left testicle

Measurements: 6.0 x 3.8 x 4.4 cm. Mildly heterogeneous and
hypervascular in comparison to the right testicle.

Right epididymis:  Normal in size and appearance.

Left epididymis: Mildly enlarged with mildly increased vascularity.
Small complex cystic area measuring 7 x 8 mm.

Hydrocele:  Moderate-sized left-sided hydrocele.

Varicocele:  None visualized.

Pulsed Doppler interrogation of both testes demonstrates normal low
resistance arterial and venous waveforms bilaterally.
IMPRESSION: Findings are suggestive of left-sided epididymo-orchitis. Moderate
reactive left-sided hydrocele. Small, 8 mm complex cystic lesion in
the epididymis, which could potentially be infectious. Recommend
follow-up ultrasound after treatment.

## 2022-08-30 DIAGNOSIS — F02A0 Dementia in other diseases classified elsewhere, mild, without behavioral disturbance, psychotic disturbance, mood disturbance, and anxiety: Secondary | ICD-10-CM | POA: Diagnosis not present

## 2022-08-30 DIAGNOSIS — R311 Benign essential microscopic hematuria: Secondary | ICD-10-CM | POA: Diagnosis not present

## 2022-08-30 DIAGNOSIS — M150 Primary generalized (osteo)arthritis: Secondary | ICD-10-CM | POA: Diagnosis not present

## 2022-08-30 DIAGNOSIS — R600 Localized edema: Secondary | ICD-10-CM | POA: Diagnosis not present

## 2022-08-30 DIAGNOSIS — R269 Unspecified abnormalities of gait and mobility: Secondary | ICD-10-CM | POA: Diagnosis not present

## 2022-08-30 DIAGNOSIS — R197 Diarrhea, unspecified: Secondary | ICD-10-CM | POA: Diagnosis not present

## 2022-08-30 DIAGNOSIS — R7303 Prediabetes: Secondary | ICD-10-CM | POA: Diagnosis not present

## 2022-08-30 DIAGNOSIS — N182 Chronic kidney disease, stage 2 (mild): Secondary | ICD-10-CM | POA: Diagnosis not present

## 2022-08-30 DIAGNOSIS — I10 Essential (primary) hypertension: Secondary | ICD-10-CM | POA: Diagnosis not present

## 2022-08-30 DIAGNOSIS — D692 Other nonthrombocytopenic purpura: Secondary | ICD-10-CM | POA: Diagnosis not present

## 2022-12-13 DIAGNOSIS — D692 Other nonthrombocytopenic purpura: Secondary | ICD-10-CM | POA: Diagnosis not present

## 2022-12-13 DIAGNOSIS — R311 Benign essential microscopic hematuria: Secondary | ICD-10-CM | POA: Diagnosis not present

## 2022-12-13 DIAGNOSIS — R5383 Other fatigue: Secondary | ICD-10-CM | POA: Diagnosis not present

## 2022-12-13 DIAGNOSIS — N182 Chronic kidney disease, stage 2 (mild): Secondary | ICD-10-CM | POA: Diagnosis not present

## 2022-12-13 DIAGNOSIS — R7303 Prediabetes: Secondary | ICD-10-CM | POA: Diagnosis not present

## 2022-12-13 DIAGNOSIS — R600 Localized edema: Secondary | ICD-10-CM | POA: Diagnosis not present

## 2022-12-13 DIAGNOSIS — M150 Primary generalized (osteo)arthritis: Secondary | ICD-10-CM | POA: Diagnosis not present

## 2022-12-13 DIAGNOSIS — R269 Unspecified abnormalities of gait and mobility: Secondary | ICD-10-CM | POA: Diagnosis not present

## 2022-12-13 DIAGNOSIS — I10 Essential (primary) hypertension: Secondary | ICD-10-CM | POA: Diagnosis not present

## 2022-12-20 DIAGNOSIS — R311 Benign essential microscopic hematuria: Secondary | ICD-10-CM | POA: Diagnosis not present

## 2022-12-20 DIAGNOSIS — N182 Chronic kidney disease, stage 2 (mild): Secondary | ICD-10-CM | POA: Diagnosis not present

## 2022-12-20 DIAGNOSIS — G309 Alzheimer's disease, unspecified: Secondary | ICD-10-CM | POA: Diagnosis not present

## 2022-12-20 DIAGNOSIS — R7303 Prediabetes: Secondary | ICD-10-CM | POA: Diagnosis not present

## 2022-12-20 DIAGNOSIS — D692 Other nonthrombocytopenic purpura: Secondary | ICD-10-CM | POA: Diagnosis not present

## 2022-12-20 DIAGNOSIS — F02B0 Dementia in other diseases classified elsewhere, moderate, without behavioral disturbance, psychotic disturbance, mood disturbance, and anxiety: Secondary | ICD-10-CM | POA: Diagnosis not present

## 2022-12-20 DIAGNOSIS — Z0000 Encounter for general adult medical examination without abnormal findings: Secondary | ICD-10-CM | POA: Diagnosis not present

## 2022-12-20 DIAGNOSIS — I10 Essential (primary) hypertension: Secondary | ICD-10-CM | POA: Diagnosis not present

## 2022-12-20 DIAGNOSIS — R600 Localized edema: Secondary | ICD-10-CM | POA: Diagnosis not present

## 2022-12-20 DIAGNOSIS — M150 Primary generalized (osteo)arthritis: Secondary | ICD-10-CM | POA: Diagnosis not present

## 2022-12-20 DIAGNOSIS — R269 Unspecified abnormalities of gait and mobility: Secondary | ICD-10-CM | POA: Diagnosis not present

## 2023-02-02 DIAGNOSIS — Z23 Encounter for immunization: Secondary | ICD-10-CM | POA: Diagnosis not present

## 2023-02-17 DIAGNOSIS — N401 Enlarged prostate with lower urinary tract symptoms: Secondary | ICD-10-CM | POA: Diagnosis not present

## 2023-02-17 DIAGNOSIS — R3914 Feeling of incomplete bladder emptying: Secondary | ICD-10-CM | POA: Diagnosis not present

## 2023-06-22 DIAGNOSIS — R7303 Prediabetes: Secondary | ICD-10-CM | POA: Diagnosis not present

## 2023-06-22 DIAGNOSIS — N182 Chronic kidney disease, stage 2 (mild): Secondary | ICD-10-CM | POA: Diagnosis not present

## 2023-06-22 DIAGNOSIS — I10 Essential (primary) hypertension: Secondary | ICD-10-CM | POA: Diagnosis not present

## 2023-06-22 DIAGNOSIS — F02B0 Dementia in other diseases classified elsewhere, moderate, without behavioral disturbance, psychotic disturbance, mood disturbance, and anxiety: Secondary | ICD-10-CM | POA: Diagnosis not present

## 2023-06-22 DIAGNOSIS — G309 Alzheimer's disease, unspecified: Secondary | ICD-10-CM | POA: Diagnosis not present

## 2023-07-11 DIAGNOSIS — R269 Unspecified abnormalities of gait and mobility: Secondary | ICD-10-CM | POA: Diagnosis not present

## 2023-07-11 DIAGNOSIS — M150 Primary generalized (osteo)arthritis: Secondary | ICD-10-CM | POA: Diagnosis not present

## 2023-07-11 DIAGNOSIS — I10 Essential (primary) hypertension: Secondary | ICD-10-CM | POA: Diagnosis not present

## 2023-07-11 DIAGNOSIS — R7303 Prediabetes: Secondary | ICD-10-CM | POA: Diagnosis not present

## 2023-07-11 DIAGNOSIS — F02B0 Dementia in other diseases classified elsewhere, moderate, without behavioral disturbance, psychotic disturbance, mood disturbance, and anxiety: Secondary | ICD-10-CM | POA: Diagnosis not present

## 2023-07-11 DIAGNOSIS — D692 Other nonthrombocytopenic purpura: Secondary | ICD-10-CM | POA: Diagnosis not present

## 2023-07-11 DIAGNOSIS — R600 Localized edema: Secondary | ICD-10-CM | POA: Diagnosis not present

## 2023-07-11 DIAGNOSIS — N182 Chronic kidney disease, stage 2 (mild): Secondary | ICD-10-CM | POA: Diagnosis not present

## 2023-07-11 DIAGNOSIS — R311 Benign essential microscopic hematuria: Secondary | ICD-10-CM | POA: Diagnosis not present

## 2023-07-20 DIAGNOSIS — N182 Chronic kidney disease, stage 2 (mild): Secondary | ICD-10-CM | POA: Diagnosis not present

## 2023-07-20 DIAGNOSIS — R32 Unspecified urinary incontinence: Secondary | ICD-10-CM | POA: Diagnosis not present

## 2023-07-20 DIAGNOSIS — Z556 Problems related to health literacy: Secondary | ICD-10-CM | POA: Diagnosis not present

## 2023-07-20 DIAGNOSIS — G309 Alzheimer's disease, unspecified: Secondary | ICD-10-CM | POA: Diagnosis not present

## 2023-07-20 DIAGNOSIS — K5790 Diverticulosis of intestine, part unspecified, without perforation or abscess without bleeding: Secondary | ICD-10-CM | POA: Diagnosis not present

## 2023-07-20 DIAGNOSIS — D692 Other nonthrombocytopenic purpura: Secondary | ICD-10-CM | POA: Diagnosis not present

## 2023-07-20 DIAGNOSIS — R7303 Prediabetes: Secondary | ICD-10-CM | POA: Diagnosis not present

## 2023-07-20 DIAGNOSIS — F02B0 Dementia in other diseases classified elsewhere, moderate, without behavioral disturbance, psychotic disturbance, mood disturbance, and anxiety: Secondary | ICD-10-CM | POA: Diagnosis not present

## 2023-07-20 DIAGNOSIS — I129 Hypertensive chronic kidney disease with stage 1 through stage 4 chronic kidney disease, or unspecified chronic kidney disease: Secondary | ICD-10-CM | POA: Diagnosis not present

## 2023-07-20 DIAGNOSIS — M150 Primary generalized (osteo)arthritis: Secondary | ICD-10-CM | POA: Diagnosis not present

## 2023-07-20 DIAGNOSIS — Z96653 Presence of artificial knee joint, bilateral: Secondary | ICD-10-CM | POA: Diagnosis not present

## 2023-07-27 DIAGNOSIS — F02B0 Dementia in other diseases classified elsewhere, moderate, without behavioral disturbance, psychotic disturbance, mood disturbance, and anxiety: Secondary | ICD-10-CM | POA: Diagnosis not present

## 2023-07-27 DIAGNOSIS — N182 Chronic kidney disease, stage 2 (mild): Secondary | ICD-10-CM | POA: Diagnosis not present

## 2023-07-27 DIAGNOSIS — M150 Primary generalized (osteo)arthritis: Secondary | ICD-10-CM | POA: Diagnosis not present

## 2023-07-27 DIAGNOSIS — G309 Alzheimer's disease, unspecified: Secondary | ICD-10-CM | POA: Diagnosis not present

## 2023-07-27 DIAGNOSIS — I129 Hypertensive chronic kidney disease with stage 1 through stage 4 chronic kidney disease, or unspecified chronic kidney disease: Secondary | ICD-10-CM | POA: Diagnosis not present

## 2023-07-27 DIAGNOSIS — D692 Other nonthrombocytopenic purpura: Secondary | ICD-10-CM | POA: Diagnosis not present

## 2023-08-01 DIAGNOSIS — I129 Hypertensive chronic kidney disease with stage 1 through stage 4 chronic kidney disease, or unspecified chronic kidney disease: Secondary | ICD-10-CM | POA: Diagnosis not present

## 2023-08-01 DIAGNOSIS — G309 Alzheimer's disease, unspecified: Secondary | ICD-10-CM | POA: Diagnosis not present

## 2023-08-01 DIAGNOSIS — F02B0 Dementia in other diseases classified elsewhere, moderate, without behavioral disturbance, psychotic disturbance, mood disturbance, and anxiety: Secondary | ICD-10-CM | POA: Diagnosis not present

## 2023-08-01 DIAGNOSIS — N182 Chronic kidney disease, stage 2 (mild): Secondary | ICD-10-CM | POA: Diagnosis not present

## 2023-08-01 DIAGNOSIS — M150 Primary generalized (osteo)arthritis: Secondary | ICD-10-CM | POA: Diagnosis not present

## 2023-08-01 DIAGNOSIS — D692 Other nonthrombocytopenic purpura: Secondary | ICD-10-CM | POA: Diagnosis not present

## 2023-08-07 DIAGNOSIS — D692 Other nonthrombocytopenic purpura: Secondary | ICD-10-CM | POA: Diagnosis not present

## 2023-08-07 DIAGNOSIS — M150 Primary generalized (osteo)arthritis: Secondary | ICD-10-CM | POA: Diagnosis not present

## 2023-08-07 DIAGNOSIS — I129 Hypertensive chronic kidney disease with stage 1 through stage 4 chronic kidney disease, or unspecified chronic kidney disease: Secondary | ICD-10-CM | POA: Diagnosis not present

## 2023-08-07 DIAGNOSIS — F02B0 Dementia in other diseases classified elsewhere, moderate, without behavioral disturbance, psychotic disturbance, mood disturbance, and anxiety: Secondary | ICD-10-CM | POA: Diagnosis not present

## 2023-08-07 DIAGNOSIS — G309 Alzheimer's disease, unspecified: Secondary | ICD-10-CM | POA: Diagnosis not present

## 2023-08-07 DIAGNOSIS — N182 Chronic kidney disease, stage 2 (mild): Secondary | ICD-10-CM | POA: Diagnosis not present

## 2023-08-19 DIAGNOSIS — F02B0 Dementia in other diseases classified elsewhere, moderate, without behavioral disturbance, psychotic disturbance, mood disturbance, and anxiety: Secondary | ICD-10-CM | POA: Diagnosis not present

## 2023-08-19 DIAGNOSIS — M150 Primary generalized (osteo)arthritis: Secondary | ICD-10-CM | POA: Diagnosis not present

## 2023-08-19 DIAGNOSIS — Z556 Problems related to health literacy: Secondary | ICD-10-CM | POA: Diagnosis not present

## 2023-08-19 DIAGNOSIS — R7303 Prediabetes: Secondary | ICD-10-CM | POA: Diagnosis not present

## 2023-08-19 DIAGNOSIS — Z96653 Presence of artificial knee joint, bilateral: Secondary | ICD-10-CM | POA: Diagnosis not present

## 2023-08-19 DIAGNOSIS — R32 Unspecified urinary incontinence: Secondary | ICD-10-CM | POA: Diagnosis not present

## 2023-08-19 DIAGNOSIS — D692 Other nonthrombocytopenic purpura: Secondary | ICD-10-CM | POA: Diagnosis not present

## 2023-08-19 DIAGNOSIS — G309 Alzheimer's disease, unspecified: Secondary | ICD-10-CM | POA: Diagnosis not present

## 2023-08-19 DIAGNOSIS — N182 Chronic kidney disease, stage 2 (mild): Secondary | ICD-10-CM | POA: Diagnosis not present

## 2023-08-19 DIAGNOSIS — I129 Hypertensive chronic kidney disease with stage 1 through stage 4 chronic kidney disease, or unspecified chronic kidney disease: Secondary | ICD-10-CM | POA: Diagnosis not present

## 2023-08-19 DIAGNOSIS — K5790 Diverticulosis of intestine, part unspecified, without perforation or abscess without bleeding: Secondary | ICD-10-CM | POA: Diagnosis not present

## 2023-09-04 DIAGNOSIS — N182 Chronic kidney disease, stage 2 (mild): Secondary | ICD-10-CM | POA: Diagnosis not present

## 2023-09-04 DIAGNOSIS — M150 Primary generalized (osteo)arthritis: Secondary | ICD-10-CM | POA: Diagnosis not present

## 2023-09-04 DIAGNOSIS — F02B0 Dementia in other diseases classified elsewhere, moderate, without behavioral disturbance, psychotic disturbance, mood disturbance, and anxiety: Secondary | ICD-10-CM | POA: Diagnosis not present

## 2023-09-04 DIAGNOSIS — G309 Alzheimer's disease, unspecified: Secondary | ICD-10-CM | POA: Diagnosis not present

## 2023-09-04 DIAGNOSIS — I129 Hypertensive chronic kidney disease with stage 1 through stage 4 chronic kidney disease, or unspecified chronic kidney disease: Secondary | ICD-10-CM | POA: Diagnosis not present

## 2023-09-04 DIAGNOSIS — D692 Other nonthrombocytopenic purpura: Secondary | ICD-10-CM | POA: Diagnosis not present

## 2023-09-05 DIAGNOSIS — C4442 Squamous cell carcinoma of skin of scalp and neck: Secondary | ICD-10-CM | POA: Diagnosis not present

## 2023-09-05 DIAGNOSIS — D485 Neoplasm of uncertain behavior of skin: Secondary | ICD-10-CM | POA: Diagnosis not present

## 2023-09-26 DIAGNOSIS — C4442 Squamous cell carcinoma of skin of scalp and neck: Secondary | ICD-10-CM | POA: Diagnosis not present

## 2023-09-26 DIAGNOSIS — L820 Inflamed seborrheic keratosis: Secondary | ICD-10-CM | POA: Diagnosis not present

## 2023-09-26 DIAGNOSIS — D485 Neoplasm of uncertain behavior of skin: Secondary | ICD-10-CM | POA: Diagnosis not present

## 2023-09-26 DIAGNOSIS — L570 Actinic keratosis: Secondary | ICD-10-CM | POA: Diagnosis not present

## 2023-10-10 DIAGNOSIS — Z48817 Encounter for surgical aftercare following surgery on the skin and subcutaneous tissue: Secondary | ICD-10-CM | POA: Diagnosis not present

## 2023-10-10 DIAGNOSIS — L570 Actinic keratosis: Secondary | ICD-10-CM | POA: Diagnosis not present

## 2023-11-14 DIAGNOSIS — Z23 Encounter for immunization: Secondary | ICD-10-CM | POA: Diagnosis not present

## 2023-11-14 DIAGNOSIS — M5442 Lumbago with sciatica, left side: Secondary | ICD-10-CM | POA: Diagnosis not present

## 2023-12-25 DIAGNOSIS — D692 Other nonthrombocytopenic purpura: Secondary | ICD-10-CM | POA: Diagnosis not present

## 2023-12-25 DIAGNOSIS — I10 Essential (primary) hypertension: Secondary | ICD-10-CM | POA: Diagnosis not present

## 2023-12-25 DIAGNOSIS — N182 Chronic kidney disease, stage 2 (mild): Secondary | ICD-10-CM | POA: Diagnosis not present

## 2023-12-25 DIAGNOSIS — M150 Primary generalized (osteo)arthritis: Secondary | ICD-10-CM | POA: Diagnosis not present

## 2023-12-25 DIAGNOSIS — F02B0 Dementia in other diseases classified elsewhere, moderate, without behavioral disturbance, psychotic disturbance, mood disturbance, and anxiety: Secondary | ICD-10-CM | POA: Diagnosis not present

## 2023-12-25 DIAGNOSIS — R5383 Other fatigue: Secondary | ICD-10-CM | POA: Diagnosis not present

## 2023-12-25 DIAGNOSIS — R7303 Prediabetes: Secondary | ICD-10-CM | POA: Diagnosis not present

## 2023-12-25 DIAGNOSIS — R269 Unspecified abnormalities of gait and mobility: Secondary | ICD-10-CM | POA: Diagnosis not present

## 2023-12-25 DIAGNOSIS — R600 Localized edema: Secondary | ICD-10-CM | POA: Diagnosis not present

## 2023-12-25 DIAGNOSIS — R311 Benign essential microscopic hematuria: Secondary | ICD-10-CM | POA: Diagnosis not present

## 2024-01-01 DIAGNOSIS — N182 Chronic kidney disease, stage 2 (mild): Secondary | ICD-10-CM | POA: Diagnosis not present

## 2024-01-01 DIAGNOSIS — R269 Unspecified abnormalities of gait and mobility: Secondary | ICD-10-CM | POA: Diagnosis not present

## 2024-01-01 DIAGNOSIS — M150 Primary generalized (osteo)arthritis: Secondary | ICD-10-CM | POA: Diagnosis not present

## 2024-01-01 DIAGNOSIS — I10 Essential (primary) hypertension: Secondary | ICD-10-CM | POA: Diagnosis not present

## 2024-01-01 DIAGNOSIS — R311 Benign essential microscopic hematuria: Secondary | ICD-10-CM | POA: Diagnosis not present

## 2024-01-01 DIAGNOSIS — D692 Other nonthrombocytopenic purpura: Secondary | ICD-10-CM | POA: Diagnosis not present

## 2024-01-01 DIAGNOSIS — F02B0 Dementia in other diseases classified elsewhere, moderate, without behavioral disturbance, psychotic disturbance, mood disturbance, and anxiety: Secondary | ICD-10-CM | POA: Diagnosis not present

## 2024-01-01 DIAGNOSIS — R7303 Prediabetes: Secondary | ICD-10-CM | POA: Diagnosis not present

## 2024-01-01 DIAGNOSIS — G309 Alzheimer's disease, unspecified: Secondary | ICD-10-CM | POA: Diagnosis not present

## 2024-01-01 DIAGNOSIS — R600 Localized edema: Secondary | ICD-10-CM | POA: Diagnosis not present

## 2024-01-01 DIAGNOSIS — Z0000 Encounter for general adult medical examination without abnormal findings: Secondary | ICD-10-CM | POA: Diagnosis not present

## 2024-03-18 DIAGNOSIS — Z23 Encounter for immunization: Secondary | ICD-10-CM | POA: Diagnosis not present

## 2024-03-28 DIAGNOSIS — R300 Dysuria: Secondary | ICD-10-CM | POA: Diagnosis not present
